# Patient Record
Sex: Female | Born: 1955
Health system: Southern US, Community
[De-identification: ages and names within clinical notes are randomized; demographics above are authoritative.]

## PROBLEM LIST (undated history)

## (undated) DIAGNOSIS — K3184 Gastroparesis: Secondary | ICD-10-CM

## (undated) DIAGNOSIS — E878 Other disorders of electrolyte and fluid balance, not elsewhere classified: Secondary | ICD-10-CM

## (undated) DIAGNOSIS — Z8249 Family history of ischemic heart disease and other diseases of the circulatory system: Secondary | ICD-10-CM

## (undated) DIAGNOSIS — M8580 Other specified disorders of bone density and structure, unspecified site: Secondary | ICD-10-CM

## (undated) DIAGNOSIS — E559 Vitamin D deficiency, unspecified: Secondary | ICD-10-CM

## (undated) DIAGNOSIS — I341 Nonrheumatic mitral (valve) prolapse: Secondary | ICD-10-CM

## (undated) DIAGNOSIS — I493 Ventricular premature depolarization: Secondary | ICD-10-CM

## (undated) DIAGNOSIS — Z923 Personal history of irradiation: Secondary | ICD-10-CM

## (undated) DIAGNOSIS — K219 Gastro-esophageal reflux disease without esophagitis: Secondary | ICD-10-CM

## (undated) DIAGNOSIS — C50919 Malignant neoplasm of unspecified site of unspecified female breast: Secondary | ICD-10-CM

## (undated) DIAGNOSIS — K5790 Diverticulosis of intestine, part unspecified, without perforation or abscess without bleeding: Secondary | ICD-10-CM

## (undated) DIAGNOSIS — K8020 Calculus of gallbladder without cholecystitis without obstruction: Secondary | ICD-10-CM

## (undated) DIAGNOSIS — E119 Type 2 diabetes mellitus without complications: Secondary | ICD-10-CM

## (undated) DIAGNOSIS — Z9289 Personal history of other medical treatment: Secondary | ICD-10-CM

## (undated) DIAGNOSIS — E785 Hyperlipidemia, unspecified: Secondary | ICD-10-CM

## (undated) DIAGNOSIS — K760 Fatty (change of) liver, not elsewhere classified: Secondary | ICD-10-CM

## (undated) DIAGNOSIS — I10 Essential (primary) hypertension: Secondary | ICD-10-CM

## (undated) HISTORY — DX: Nonrheumatic mitral (valve) prolapse: I34.1

## (undated) HISTORY — DX: Gastro-esophageal reflux disease without esophagitis: K21.9

## (undated) HISTORY — DX: Family history of ischemic heart disease and other diseases of the circulatory system: Z82.49

## (undated) HISTORY — DX: Personal history of other medical treatment: Z92.89

## (undated) HISTORY — DX: Diverticulosis of intestine, part unspecified, without perforation or abscess without bleeding: K57.90

## (undated) HISTORY — DX: Essential (primary) hypertension: I10

## (undated) HISTORY — DX: Vitamin D deficiency, unspecified: E55.9

## (undated) HISTORY — DX: Hyperlipidemia, unspecified: E78.5

## (undated) HISTORY — PX: LAPAROSCOPIC OOPHERECTOMY: SHX6507

## (undated) HISTORY — DX: Type 2 diabetes mellitus without complications: E11.9

## (undated) HISTORY — DX: Other specified disorders of bone density and structure, unspecified site: M85.80

## (undated) HISTORY — DX: Gastroparesis: K31.84

---
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
## 1997-09-23 ENCOUNTER — Ambulatory Visit (HOSPITAL_COMMUNITY): Admission: RE | Admit: 1997-09-23 | Discharge: 1997-09-23 | Payer: Self-pay | Admitting: Cardiology

## 1997-11-12 ENCOUNTER — Other Ambulatory Visit: Admission: RE | Admit: 1997-11-12 | Discharge: 1997-11-12 | Payer: Self-pay | Admitting: *Deleted

## 1999-01-22 ENCOUNTER — Other Ambulatory Visit: Admission: RE | Admit: 1999-01-22 | Discharge: 1999-01-22 | Payer: Self-pay | Admitting: *Deleted

## 2000-01-25 ENCOUNTER — Other Ambulatory Visit: Admission: RE | Admit: 2000-01-25 | Discharge: 2000-01-25 | Payer: Self-pay | Admitting: *Deleted

## 2000-06-11 ENCOUNTER — Encounter: Payer: Self-pay | Admitting: Emergency Medicine

## 2000-06-11 ENCOUNTER — Emergency Department (HOSPITAL_COMMUNITY): Admission: EM | Admit: 2000-06-11 | Discharge: 2000-06-11 | Payer: Self-pay | Admitting: Emergency Medicine

## 2001-02-27 ENCOUNTER — Other Ambulatory Visit: Admission: RE | Admit: 2001-02-27 | Discharge: 2001-02-27 | Payer: Self-pay | Admitting: *Deleted

## 2002-01-12 HISTORY — PX: APPENDECTOMY: SHX54

## 2002-02-06 ENCOUNTER — Encounter (INDEPENDENT_AMBULATORY_CARE_PROVIDER_SITE_OTHER): Payer: Self-pay

## 2002-02-06 ENCOUNTER — Observation Stay (HOSPITAL_COMMUNITY): Admission: EM | Admit: 2002-02-06 | Discharge: 2002-02-06 | Payer: Self-pay | Admitting: Emergency Medicine

## 2002-10-25 ENCOUNTER — Other Ambulatory Visit: Admission: RE | Admit: 2002-10-25 | Discharge: 2002-10-25 | Payer: Self-pay | Admitting: Obstetrics and Gynecology

## 2003-03-15 HISTORY — PX: ABDOMINAL HYSTERECTOMY: SHX81

## 2003-04-05 ENCOUNTER — Inpatient Hospital Stay (HOSPITAL_COMMUNITY): Admission: RE | Admit: 2003-04-05 | Discharge: 2003-04-07 | Payer: Self-pay | Admitting: Obstetrics and Gynecology

## 2003-04-05 ENCOUNTER — Encounter (INDEPENDENT_AMBULATORY_CARE_PROVIDER_SITE_OTHER): Payer: Self-pay

## 2004-05-12 ENCOUNTER — Other Ambulatory Visit: Admission: RE | Admit: 2004-05-12 | Discharge: 2004-05-12 | Payer: Self-pay | Admitting: Obstetrics and Gynecology

## 2004-06-09 ENCOUNTER — Ambulatory Visit: Payer: Self-pay | Admitting: Internal Medicine

## 2004-06-14 HISTORY — PX: MASTECTOMY: SHX3

## 2004-06-14 HISTORY — PX: BREAST LUMPECTOMY: SHX2

## 2004-06-26 ENCOUNTER — Ambulatory Visit: Payer: Self-pay | Admitting: Internal Medicine

## 2004-06-26 DIAGNOSIS — K5730 Diverticulosis of large intestine without perforation or abscess without bleeding: Secondary | ICD-10-CM

## 2004-06-26 HISTORY — DX: Diverticulosis of large intestine without perforation or abscess without bleeding: K57.30

## 2004-08-06 ENCOUNTER — Encounter: Admission: RE | Admit: 2004-08-06 | Discharge: 2004-08-06 | Payer: Self-pay | Admitting: *Deleted

## 2004-10-27 ENCOUNTER — Encounter: Admission: RE | Admit: 2004-10-27 | Discharge: 2004-10-27 | Payer: Self-pay | Admitting: Surgery

## 2004-11-13 ENCOUNTER — Encounter: Admission: RE | Admit: 2004-11-13 | Discharge: 2004-11-13 | Payer: Self-pay | Admitting: Surgery

## 2004-11-18 ENCOUNTER — Ambulatory Visit (HOSPITAL_COMMUNITY): Admission: RE | Admit: 2004-11-18 | Discharge: 2004-11-18 | Payer: Self-pay | Admitting: Surgery

## 2004-11-18 ENCOUNTER — Ambulatory Visit (HOSPITAL_BASED_OUTPATIENT_CLINIC_OR_DEPARTMENT_OTHER): Admission: RE | Admit: 2004-11-18 | Discharge: 2004-11-18 | Payer: Self-pay | Admitting: Surgery

## 2004-11-18 ENCOUNTER — Encounter (INDEPENDENT_AMBULATORY_CARE_PROVIDER_SITE_OTHER): Payer: Self-pay | Admitting: *Deleted

## 2004-11-30 ENCOUNTER — Ambulatory Visit (HOSPITAL_COMMUNITY): Admission: RE | Admit: 2004-11-30 | Discharge: 2004-11-30 | Payer: Self-pay | Admitting: Surgery

## 2004-11-30 ENCOUNTER — Encounter (INDEPENDENT_AMBULATORY_CARE_PROVIDER_SITE_OTHER): Payer: Self-pay | Admitting: *Deleted

## 2004-11-30 ENCOUNTER — Ambulatory Visit (HOSPITAL_BASED_OUTPATIENT_CLINIC_OR_DEPARTMENT_OTHER): Admission: RE | Admit: 2004-11-30 | Discharge: 2004-11-30 | Payer: Self-pay | Admitting: Surgery

## 2004-12-18 ENCOUNTER — Ambulatory Visit: Payer: Self-pay | Admitting: Oncology

## 2004-12-30 ENCOUNTER — Ambulatory Visit: Admission: RE | Admit: 2004-12-30 | Discharge: 2005-03-12 | Payer: Self-pay | Admitting: Radiation Oncology

## 2005-02-26 ENCOUNTER — Ambulatory Visit: Payer: Self-pay | Admitting: Oncology

## 2005-04-30 ENCOUNTER — Ambulatory Visit: Payer: Self-pay | Admitting: Oncology

## 2005-05-16 ENCOUNTER — Emergency Department (HOSPITAL_COMMUNITY): Admission: EM | Admit: 2005-05-16 | Discharge: 2005-05-16 | Payer: Self-pay | Admitting: Emergency Medicine

## 2005-09-08 ENCOUNTER — Ambulatory Visit: Admission: RE | Admit: 2005-09-08 | Discharge: 2005-09-08 | Payer: Self-pay | Admitting: Oncology

## 2005-09-08 ENCOUNTER — Ambulatory Visit: Payer: Self-pay | Admitting: Oncology

## 2005-11-01 ENCOUNTER — Ambulatory Visit: Payer: Self-pay | Admitting: Oncology

## 2005-11-01 LAB — CBC WITH DIFFERENTIAL/PLATELET
BASO%: 0.4 % (ref 0.0–2.0)
Basophils Absolute: 0 10*3/uL (ref 0.0–0.1)
EOS%: 1.8 % (ref 0.0–7.0)
Eosinophils Absolute: 0.1 10*3/uL (ref 0.0–0.5)
HCT: 37.8 % (ref 34.8–46.6)
HGB: 13.2 g/dL (ref 11.6–15.9)
LYMPH%: 25.4 % (ref 14.0–48.0)
MCH: 33.7 pg (ref 26.0–34.0)
MCHC: 34.9 g/dL (ref 32.0–36.0)
MCV: 96.4 fL (ref 81.0–101.0)
MONO#: 0.5 10*3/uL (ref 0.1–0.9)
MONO%: 8.5 % (ref 0.0–13.0)
NEUT#: 4.1 10*3/uL (ref 1.5–6.5)
NEUT%: 63.9 % (ref 39.6–76.8)
Platelets: 254 10*3/uL (ref 145–400)
RBC: 3.92 10*6/uL (ref 3.70–5.32)
RDW: 13.6 % (ref 11.3–14.5)
WBC: 6.5 10*3/uL (ref 3.9–10.0)
lymph#: 1.6 10*3/uL (ref 0.9–3.3)

## 2005-11-01 LAB — COMPREHENSIVE METABOLIC PANEL
ALT: 17 U/L (ref 0–40)
AST: 17 U/L (ref 0–37)
Albumin: 4.3 g/dL (ref 3.5–5.2)
Alkaline Phosphatase: 34 U/L — ABNORMAL LOW (ref 39–117)
BUN: 16 mg/dL (ref 6–23)
CO2: 25 mEq/L (ref 19–32)
Calcium: 9.4 mg/dL (ref 8.4–10.5)
Chloride: 108 mEq/L (ref 96–112)
Creatinine, Ser: 1 mg/dL (ref 0.4–1.2)
Glucose, Bld: 97 mg/dL (ref 70–99)
Potassium: 4 mEq/L (ref 3.5–5.3)
Sodium: 141 mEq/L (ref 135–145)
Total Bilirubin: 0.4 mg/dL (ref 0.3–1.2)
Total Protein: 6.5 g/dL (ref 6.0–8.3)

## 2005-11-01 LAB — CANCER ANTIGEN 27.29: CA 27.29: 18 U/mL (ref 0–39)

## 2006-01-12 ENCOUNTER — Ambulatory Visit: Payer: Self-pay | Admitting: Oncology

## 2006-01-13 ENCOUNTER — Encounter: Payer: Self-pay | Admitting: Vascular Surgery

## 2006-01-13 ENCOUNTER — Ambulatory Visit: Admission: RE | Admit: 2006-01-13 | Discharge: 2006-01-13 | Payer: Self-pay | Admitting: Oncology

## 2006-02-08 LAB — CBC WITH DIFFERENTIAL/PLATELET
BASO%: 0.5 % (ref 0.0–2.0)
Basophils Absolute: 0 10*3/uL (ref 0.0–0.1)
EOS%: 2 % (ref 0.0–7.0)
Eosinophils Absolute: 0.2 10*3/uL (ref 0.0–0.5)
HCT: 37.9 % (ref 34.8–46.6)
HGB: 13 g/dL (ref 11.6–15.9)
LYMPH%: 22.5 % (ref 14.0–48.0)
MCH: 33 pg (ref 26.0–34.0)
MCHC: 34.4 g/dL (ref 32.0–36.0)
MCV: 95.8 fL (ref 81.0–101.0)
MONO#: 0.6 10*3/uL (ref 0.1–0.9)
MONO%: 6.3 % (ref 0.0–13.0)
NEUT#: 6.4 10*3/uL (ref 1.5–6.5)
NEUT%: 68.7 % (ref 39.6–76.8)
Platelets: 354 10*3/uL (ref 145–400)
RBC: 3.96 10*6/uL (ref 3.70–5.32)
RDW: 13.5 % (ref 11.3–14.5)
WBC: 9.3 10*3/uL (ref 3.9–10.0)
lymph#: 2.1 10*3/uL (ref 0.9–3.3)

## 2006-02-16 LAB — COMPREHENSIVE METABOLIC PANEL
ALT: 18 U/L (ref 0–40)
AST: 17 U/L (ref 0–37)
Albumin: 4.2 g/dL (ref 3.5–5.2)
Alkaline Phosphatase: 43 U/L (ref 39–117)
BUN: 17 mg/dL (ref 6–23)
CO2: 28 mEq/L (ref 19–32)
Calcium: 9.2 mg/dL (ref 8.4–10.5)
Chloride: 105 mEq/L (ref 96–112)
Creatinine, Ser: 1.05 mg/dL (ref 0.40–1.20)
Glucose, Bld: 97 mg/dL (ref 70–99)
Potassium: 3.7 mEq/L (ref 3.5–5.3)
Sodium: 143 mEq/L (ref 135–145)
Total Bilirubin: 0.3 mg/dL (ref 0.3–1.2)
Total Protein: 6.8 g/dL (ref 6.0–8.3)

## 2006-02-16 LAB — ESTRADIOL, ULTRA SENS: Estradiol, Ultra Sensitive: 245 pg/mL

## 2006-02-16 LAB — FOLLICLE STIMULATING HORMONE: FSH: 36.1 m[IU]/mL

## 2006-02-16 LAB — CANCER ANTIGEN 27.29: CA 27.29: 16 U/mL (ref 0–39)

## 2006-03-31 ENCOUNTER — Ambulatory Visit: Payer: Self-pay | Admitting: Oncology

## 2006-06-15 ENCOUNTER — Ambulatory Visit: Payer: Self-pay | Admitting: Oncology

## 2006-06-20 LAB — CBC WITH DIFFERENTIAL/PLATELET
BASO%: 0.5 % (ref 0.0–2.0)
Basophils Absolute: 0 10*3/uL (ref 0.0–0.1)
EOS%: 1.6 % (ref 0.0–7.0)
Eosinophils Absolute: 0.1 10*3/uL (ref 0.0–0.5)
HCT: 39.1 % (ref 34.8–46.6)
HGB: 13.3 g/dL (ref 11.6–15.9)
LYMPH%: 25 % (ref 14.0–48.0)
MCH: 32.8 pg (ref 26.0–34.0)
MCHC: 34.1 g/dL (ref 32.0–36.0)
MCV: 96.1 fL (ref 81.0–101.0)
MONO#: 0.6 10*3/uL (ref 0.1–0.9)
MONO%: 8.1 % (ref 0.0–13.0)
NEUT#: 4.8 10*3/uL (ref 1.5–6.5)
NEUT%: 64.8 % (ref 39.6–76.8)
Platelets: 321 10*3/uL (ref 145–400)
RBC: 4.07 10*6/uL (ref 3.70–5.32)
RDW: 13.4 % (ref 11.3–14.5)
WBC: 7.3 10*3/uL (ref 3.9–10.0)
lymph#: 1.8 10*3/uL (ref 0.9–3.3)

## 2006-06-20 LAB — COMPREHENSIVE METABOLIC PANEL
ALT: 19 U/L (ref 0–35)
AST: 18 U/L (ref 0–37)
Albumin: 4.5 g/dL (ref 3.5–5.2)
Alkaline Phosphatase: 43 U/L (ref 39–117)
BUN: 23 mg/dL (ref 6–23)
CO2: 26 mEq/L (ref 19–32)
Calcium: 9.8 mg/dL (ref 8.4–10.5)
Chloride: 105 mEq/L (ref 96–112)
Creatinine, Ser: 1.05 mg/dL (ref 0.40–1.20)
Glucose, Bld: 96 mg/dL (ref 70–99)
Potassium: 4.3 mEq/L (ref 3.5–5.3)
Sodium: 139 mEq/L (ref 135–145)
Total Bilirubin: 0.4 mg/dL (ref 0.3–1.2)
Total Protein: 6.8 g/dL (ref 6.0–8.3)

## 2006-06-20 LAB — CANCER ANTIGEN 27.29: CA 27.29: 18 U/mL (ref 0–39)

## 2006-12-15 ENCOUNTER — Ambulatory Visit: Payer: Self-pay | Admitting: Oncology

## 2006-12-20 LAB — COMPREHENSIVE METABOLIC PANEL
ALT: 41 U/L — ABNORMAL HIGH (ref 0–35)
AST: 35 U/L (ref 0–37)
Albumin: 4.2 g/dL (ref 3.5–5.2)
Alkaline Phosphatase: 49 U/L (ref 39–117)
BUN: 13 mg/dL (ref 6–23)
CO2: 25 mEq/L (ref 19–32)
Calcium: 9.7 mg/dL (ref 8.4–10.5)
Chloride: 106 mEq/L (ref 96–112)
Creatinine, Ser: 1.03 mg/dL (ref 0.40–1.20)
Glucose, Bld: 122 mg/dL — ABNORMAL HIGH (ref 70–99)
Potassium: 3.9 mEq/L (ref 3.5–5.3)
Sodium: 143 mEq/L (ref 135–145)
Total Bilirubin: 0.3 mg/dL (ref 0.3–1.2)
Total Protein: 6.7 g/dL (ref 6.0–8.3)

## 2006-12-20 LAB — CBC WITH DIFFERENTIAL/PLATELET
BASO%: 0.2 % (ref 0.0–2.0)
Basophils Absolute: 0 10*3/uL (ref 0.0–0.1)
EOS%: 3 % (ref 0.0–7.0)
Eosinophils Absolute: 0.2 10*3/uL (ref 0.0–0.5)
HCT: 38 % (ref 34.8–46.6)
HGB: 13.3 g/dL (ref 11.6–15.9)
LYMPH%: 24.5 % (ref 14.0–48.0)
MCH: 33.2 pg (ref 26.0–34.0)
MCHC: 35.1 g/dL (ref 32.0–36.0)
MCV: 94.8 fL (ref 81.0–101.0)
MONO#: 0.6 10*3/uL (ref 0.1–0.9)
MONO%: 7.8 % (ref 0.0–13.0)
NEUT#: 4.6 10*3/uL (ref 1.5–6.5)
NEUT%: 64.5 % (ref 39.6–76.8)
Platelets: 317 10*3/uL (ref 145–400)
RBC: 4.01 10*6/uL (ref 3.70–5.32)
RDW: 13.3 % (ref 11.3–14.5)
WBC: 7.1 10*3/uL (ref 3.9–10.0)
lymph#: 1.7 10*3/uL (ref 0.9–3.3)

## 2006-12-20 LAB — CANCER ANTIGEN 27.29: CA 27.29: 17 U/mL (ref 0–39)

## 2006-12-20 LAB — FOLLICLE STIMULATING HORMONE: FSH: 33.5 m[IU]/mL

## 2006-12-23 ENCOUNTER — Ambulatory Visit (HOSPITAL_COMMUNITY): Admission: RE | Admit: 2006-12-23 | Discharge: 2006-12-23 | Payer: Self-pay | Admitting: Internal Medicine

## 2006-12-23 ENCOUNTER — Ambulatory Visit: Payer: Self-pay | Admitting: Gastroenterology

## 2006-12-23 LAB — CONVERTED CEMR LAB
Amylase: 65 units/L (ref 27–131)
Basophils Absolute: 0.1 10*3/uL (ref 0.0–0.1)
Basophils Relative: 0.7 % (ref 0.0–1.0)
Eosinophils Absolute: 0.1 10*3/uL (ref 0.0–0.6)
Eosinophils Relative: 1.4 % (ref 0.0–5.0)
HCT: 37.9 % (ref 36.0–46.0)
Hemoglobin: 13.3 g/dL (ref 12.0–15.0)
Lipase: 24 units/L (ref 11.0–59.0)
Lymphocytes Relative: 23.8 % (ref 12.0–46.0)
MCHC: 35 g/dL (ref 30.0–36.0)
MCV: 94.7 fL (ref 78.0–100.0)
Monocytes Absolute: 0.7 10*3/uL (ref 0.2–0.7)
Monocytes Relative: 8.1 % (ref 3.0–11.0)
Neutro Abs: 5.9 10*3/uL (ref 1.4–7.7)
Neutrophils Relative %: 66 % (ref 43.0–77.0)
Platelets: 356 10*3/uL (ref 150–400)
RBC: 4 M/uL (ref 3.87–5.11)
RDW: 12.5 % (ref 11.5–14.6)
WBC: 8.9 10*3/uL (ref 4.5–10.5)

## 2006-12-30 LAB — ESTRADIOL, ULTRA SENS: Estradiol, Ultra Sensitive: <2 pg/mL

## 2007-01-03 ENCOUNTER — Ambulatory Visit: Payer: Self-pay | Admitting: Internal Medicine

## 2007-01-05 ENCOUNTER — Ambulatory Visit (HOSPITAL_COMMUNITY): Admission: RE | Admit: 2007-01-05 | Discharge: 2007-01-05 | Payer: Self-pay | Admitting: Obstetrics and Gynecology

## 2007-01-05 ENCOUNTER — Encounter (INDEPENDENT_AMBULATORY_CARE_PROVIDER_SITE_OTHER): Payer: Self-pay | Admitting: Obstetrics and Gynecology

## 2007-03-22 ENCOUNTER — Ambulatory Visit: Payer: Self-pay | Admitting: Gastroenterology

## 2007-03-28 ENCOUNTER — Ambulatory Visit (HOSPITAL_COMMUNITY): Admission: RE | Admit: 2007-03-28 | Discharge: 2007-03-28 | Payer: Self-pay | Admitting: Gastroenterology

## 2007-03-28 DIAGNOSIS — K3184 Gastroparesis: Secondary | ICD-10-CM | POA: Insufficient documentation

## 2007-03-30 ENCOUNTER — Ambulatory Visit: Payer: Self-pay | Admitting: Internal Medicine

## 2007-03-30 DIAGNOSIS — D131 Benign neoplasm of stomach: Secondary | ICD-10-CM | POA: Insufficient documentation

## 2007-03-30 HISTORY — DX: Benign neoplasm of stomach: D13.1

## 2007-04-18 ENCOUNTER — Ambulatory Visit: Payer: Self-pay | Admitting: Internal Medicine

## 2007-04-18 LAB — CONVERTED CEMR LAB: TSH: 0.67 microintl units/mL (ref 0.35–5.50)

## 2007-08-04 DIAGNOSIS — E785 Hyperlipidemia, unspecified: Secondary | ICD-10-CM | POA: Insufficient documentation

## 2007-08-04 DIAGNOSIS — N83209 Unspecified ovarian cyst, unspecified side: Secondary | ICD-10-CM

## 2007-08-04 DIAGNOSIS — R143 Flatulence: Secondary | ICD-10-CM

## 2007-08-04 DIAGNOSIS — R142 Eructation: Secondary | ICD-10-CM

## 2007-08-04 DIAGNOSIS — K589 Irritable bowel syndrome without diarrhea: Secondary | ICD-10-CM | POA: Insufficient documentation

## 2007-08-04 DIAGNOSIS — R141 Gas pain: Secondary | ICD-10-CM | POA: Insufficient documentation

## 2007-08-04 DIAGNOSIS — C50919 Malignant neoplasm of unspecified site of unspecified female breast: Secondary | ICD-10-CM | POA: Insufficient documentation

## 2007-08-04 HISTORY — DX: Irritable bowel syndrome, unspecified: K58.9

## 2007-08-04 HISTORY — DX: Unspecified ovarian cyst, unspecified side: N83.209

## 2007-10-18 ENCOUNTER — Encounter: Payer: Self-pay | Admitting: Internal Medicine

## 2007-10-25 ENCOUNTER — Ambulatory Visit: Payer: Self-pay | Admitting: Internal Medicine

## 2007-12-18 ENCOUNTER — Ambulatory Visit: Payer: Self-pay | Admitting: Oncology

## 2007-12-21 LAB — CBC WITH DIFFERENTIAL/PLATELET
BASO%: 0.5 % (ref 0.0–2.0)
Basophils Absolute: 0 10*3/uL (ref 0.0–0.1)
EOS%: 1.8 % (ref 0.0–7.0)
Eosinophils Absolute: 0.1 10*3/uL (ref 0.0–0.5)
HCT: 37.2 % (ref 34.8–46.6)
HGB: 12.9 g/dL (ref 11.6–15.9)
LYMPH%: 26.3 % (ref 14.0–48.0)
MCH: 32.4 pg (ref 26.0–34.0)
MCHC: 34.5 g/dL (ref 32.0–36.0)
MCV: 93.9 fL (ref 81.0–101.0)
MONO#: 0.5 10*3/uL (ref 0.1–0.9)
MONO%: 7.2 % (ref 0.0–13.0)
NEUT#: 4.4 10*3/uL (ref 1.5–6.5)
NEUT%: 64.2 % (ref 39.6–76.8)
Platelets: 277 10*3/uL (ref 145–400)
RBC: 3.96 10*6/uL (ref 3.70–5.32)
RDW: 13.4 % (ref 11.3–14.5)
WBC: 6.9 10*3/uL (ref 3.9–10.0)
lymph#: 1.8 10*3/uL (ref 0.9–3.3)

## 2007-12-22 LAB — COMPREHENSIVE METABOLIC PANEL
ALT: 30 U/L (ref 0–35)
AST: 27 U/L (ref 0–37)
Albumin: 4.4 g/dL (ref 3.5–5.2)
Alkaline Phosphatase: 52 U/L (ref 39–117)
BUN: 15 mg/dL (ref 6–23)
CO2: 25 mEq/L (ref 19–32)
Calcium: 9.4 mg/dL (ref 8.4–10.5)
Chloride: 106 mEq/L (ref 96–112)
Creatinine, Ser: 1.13 mg/dL (ref 0.40–1.20)
Glucose, Bld: 99 mg/dL (ref 70–99)
Potassium: 3.5 mEq/L (ref 3.5–5.3)
Sodium: 141 mEq/L (ref 135–145)
Total Bilirubin: 0.2 mg/dL — ABNORMAL LOW (ref 0.3–1.2)
Total Protein: 6.8 g/dL (ref 6.0–8.3)

## 2007-12-22 LAB — CANCER ANTIGEN 27.29: CA 27.29: 24 U/mL (ref 0–39)

## 2007-12-22 LAB — VITAMIN D 25 HYDROXY (VIT D DEFICIENCY, FRACTURES): Vit D, 25-Hydroxy: 19 ng/mL — ABNORMAL LOW (ref 30–89)

## 2007-12-28 ENCOUNTER — Encounter: Payer: Self-pay | Admitting: Internal Medicine

## 2008-12-20 ENCOUNTER — Ambulatory Visit: Payer: Self-pay | Admitting: Oncology

## 2008-12-24 LAB — CBC WITH DIFFERENTIAL/PLATELET
BASO%: 0.5 % (ref 0.0–2.0)
Basophils Absolute: 0 10*3/uL (ref 0.0–0.1)
EOS%: 2.3 % (ref 0.0–7.0)
Eosinophils Absolute: 0.2 10*3/uL (ref 0.0–0.5)
HCT: 38.1 % (ref 34.8–46.6)
HGB: 13.2 g/dL (ref 11.6–15.9)
LYMPH%: 27.5 % (ref 14.0–49.7)
MCH: 33.1 pg (ref 25.1–34.0)
MCHC: 34.7 g/dL (ref 31.5–36.0)
MCV: 95.3 fL (ref 79.5–101.0)
MONO#: 0.5 10*3/uL (ref 0.1–0.9)
MONO%: 6 % (ref 0.0–14.0)
NEUT#: 4.9 10*3/uL (ref 1.5–6.5)
NEUT%: 63.7 % (ref 38.4–76.8)
Platelets: 273 10*3/uL (ref 145–400)
RBC: 4 10*6/uL (ref 3.70–5.45)
RDW: 13.1 % (ref 11.2–14.5)
WBC: 7.7 10*3/uL (ref 3.9–10.3)
lymph#: 2.1 10*3/uL (ref 0.9–3.3)

## 2008-12-24 LAB — COMPREHENSIVE METABOLIC PANEL
ALT: 35 U/L (ref 0–35)
AST: 29 U/L (ref 0–37)
Albumin: 4.3 g/dL (ref 3.5–5.2)
Alkaline Phosphatase: 57 U/L (ref 39–117)
BUN: 17 mg/dL (ref 6–23)
CO2: 23 mEq/L (ref 19–32)
Calcium: 9.6 mg/dL (ref 8.4–10.5)
Chloride: 105 mEq/L (ref 96–112)
Creatinine, Ser: 1.15 mg/dL (ref 0.40–1.20)
Glucose, Bld: 125 mg/dL — ABNORMAL HIGH (ref 70–99)
Potassium: 4.2 mEq/L (ref 3.5–5.3)
Sodium: 141 mEq/L (ref 135–145)
Total Bilirubin: 0.2 mg/dL — ABNORMAL LOW (ref 0.3–1.2)
Total Protein: 6.4 g/dL (ref 6.0–8.3)

## 2009-10-12 ENCOUNTER — Encounter (INDEPENDENT_AMBULATORY_CARE_PROVIDER_SITE_OTHER): Payer: Self-pay | Admitting: *Deleted

## 2009-10-12 ENCOUNTER — Observation Stay (HOSPITAL_COMMUNITY): Admission: EM | Admit: 2009-10-12 | Discharge: 2009-10-13 | Payer: Self-pay | Admitting: Emergency Medicine

## 2009-10-13 HISTORY — PX: CARDIAC CATHETERIZATION: SHX172

## 2009-10-14 ENCOUNTER — Encounter (INDEPENDENT_AMBULATORY_CARE_PROVIDER_SITE_OTHER): Payer: Self-pay | Admitting: *Deleted

## 2009-10-22 ENCOUNTER — Telehealth: Payer: Self-pay | Admitting: Internal Medicine

## 2009-11-06 ENCOUNTER — Telehealth: Payer: Self-pay | Admitting: Internal Medicine

## 2009-11-07 ENCOUNTER — Ambulatory Visit: Payer: Self-pay | Admitting: Gastroenterology

## 2009-11-07 DIAGNOSIS — R6881 Early satiety: Secondary | ICD-10-CM | POA: Insufficient documentation

## 2009-11-07 DIAGNOSIS — Z853 Personal history of malignant neoplasm of breast: Secondary | ICD-10-CM | POA: Insufficient documentation

## 2009-11-07 DIAGNOSIS — F411 Generalized anxiety disorder: Secondary | ICD-10-CM

## 2009-11-07 DIAGNOSIS — E119 Type 2 diabetes mellitus without complications: Secondary | ICD-10-CM

## 2009-11-07 DIAGNOSIS — R634 Abnormal weight loss: Secondary | ICD-10-CM | POA: Insufficient documentation

## 2009-11-07 DIAGNOSIS — E0590 Thyrotoxicosis, unspecified without thyrotoxic crisis or storm: Secondary | ICD-10-CM

## 2009-11-07 DIAGNOSIS — R1013 Epigastric pain: Secondary | ICD-10-CM | POA: Insufficient documentation

## 2009-11-07 DIAGNOSIS — K219 Gastro-esophageal reflux disease without esophagitis: Secondary | ICD-10-CM

## 2009-11-07 DIAGNOSIS — R12 Heartburn: Secondary | ICD-10-CM | POA: Insufficient documentation

## 2009-11-07 HISTORY — DX: Gastro-esophageal reflux disease without esophagitis: K21.9

## 2009-11-07 HISTORY — DX: Thyrotoxicosis, unspecified without thyrotoxic crisis or storm: E05.90

## 2009-11-07 HISTORY — DX: Generalized anxiety disorder: F41.1

## 2009-11-07 HISTORY — DX: Type 2 diabetes mellitus without complications: E11.9

## 2009-11-11 ENCOUNTER — Encounter (INDEPENDENT_AMBULATORY_CARE_PROVIDER_SITE_OTHER): Payer: Self-pay | Admitting: *Deleted

## 2009-11-12 ENCOUNTER — Ambulatory Visit (HOSPITAL_COMMUNITY): Admission: RE | Admit: 2009-11-12 | Discharge: 2009-11-12 | Payer: Self-pay | Admitting: Internal Medicine

## 2009-11-12 ENCOUNTER — Ambulatory Visit: Payer: Self-pay | Admitting: Internal Medicine

## 2009-11-12 LAB — CONVERTED CEMR LAB
ALT: 37 units/L — ABNORMAL HIGH (ref 0–35)
AST: 32 units/L (ref 0–37)
Albumin: 4.4 g/dL (ref 3.5–5.2)
Alkaline Phosphatase: 56 units/L (ref 39–117)
BUN: 17 mg/dL (ref 6–23)
Basophils Absolute: 0 10*3/uL (ref 0.0–0.1)
Basophils Relative: 0.4 % (ref 0.0–3.0)
CO2: 30 meq/L (ref 19–32)
Calcium: 9.7 mg/dL (ref 8.4–10.5)
Chloride: 105 meq/L (ref 96–112)
Creatinine, Ser: 1 mg/dL (ref 0.4–1.2)
Eosinophils Absolute: 0.2 10*3/uL (ref 0.0–0.7)
Eosinophils Relative: 2.2 % (ref 0.0–5.0)
GFR calc non Af Amer: 59.6 mL/min (ref >60)
Glucose, Bld: 83 mg/dL (ref 70–99)
HCT: 38.6 % (ref 36.0–46.0)
Hemoglobin: 13.4 g/dL (ref 12.0–15.0)
Lipase: 10 units/L — ABNORMAL LOW (ref 11.0–59.0)
Lymphocytes Relative: 27.1 % (ref 12.0–46.0)
Lymphs Abs: 2.4 10*3/uL (ref 0.7–4.0)
MCHC: 34.7 g/dL (ref 30.0–36.0)
MCV: 95.4 fL (ref 78.0–100.0)
Monocytes Absolute: 0.7 10*3/uL (ref 0.1–1.0)
Monocytes Relative: 7.7 % (ref 3.0–12.0)
Neutro Abs: 5.6 10*3/uL (ref 1.4–7.7)
Neutrophils Relative %: 62.6 % (ref 43.0–77.0)
Platelets: 310 10*3/uL (ref 150.0–400.0)
Potassium: 4 meq/L (ref 3.5–5.1)
RBC: 4.04 M/uL (ref 3.87–5.11)
RDW: 13 % (ref 11.5–14.6)
Sodium: 143 meq/L (ref 135–145)
Total Bilirubin: 0.3 mg/dL (ref 0.3–1.2)
Total Protein: 7 g/dL (ref 6.0–8.3)
WBC: 9 10*3/uL (ref 4.5–10.5)

## 2009-11-13 ENCOUNTER — Ambulatory Visit: Payer: Self-pay | Admitting: Internal Medicine

## 2009-12-08 ENCOUNTER — Ambulatory Visit: Payer: Self-pay | Admitting: Internal Medicine

## 2009-12-08 LAB — CONVERTED CEMR LAB: IgA: 159 mg/dL (ref 68–378)

## 2009-12-09 ENCOUNTER — Encounter: Payer: Self-pay | Admitting: Internal Medicine

## 2009-12-10 LAB — CONVERTED CEMR LAB: Tissue Transglutaminase Ab, IgA: 4.5 units (ref <20)

## 2009-12-12 ENCOUNTER — Ambulatory Visit: Payer: Self-pay | Admitting: Oncology

## 2009-12-17 LAB — CBC WITH DIFFERENTIAL/PLATELET
BASO%: 0.8 % (ref 0.0–2.0)
Basophils Absolute: 0.1 10*3/uL (ref 0.0–0.1)
EOS%: 2.8 % (ref 0.0–7.0)
Eosinophils Absolute: 0.2 10*3/uL (ref 0.0–0.5)
HCT: 36.2 % (ref 34.8–46.6)
HGB: 12.7 g/dL (ref 11.6–15.9)
LYMPH%: 28.3 % (ref 14.0–49.7)
MCH: 33.5 pg (ref 25.1–34.0)
MCHC: 35.2 g/dL (ref 31.5–36.0)
MCV: 95.1 fL (ref 79.5–101.0)
MONO#: 0.6 10*3/uL (ref 0.1–0.9)
MONO%: 7.9 % (ref 0.0–14.0)
NEUT#: 4.2 10*3/uL (ref 1.5–6.5)
NEUT%: 60.2 % (ref 38.4–76.8)
Platelets: 285 10*3/uL (ref 145–400)
RBC: 3.81 10*6/uL (ref 3.70–5.45)
RDW: 13.5 % (ref 11.2–14.5)
WBC: 7 10*3/uL (ref 3.9–10.3)
lymph#: 2 10*3/uL (ref 0.9–3.3)

## 2009-12-17 LAB — COMPREHENSIVE METABOLIC PANEL
ALT: 24 U/L (ref 0–35)
AST: 25 U/L (ref 0–37)
Albumin: 4.2 g/dL (ref 3.5–5.2)
Alkaline Phosphatase: 58 U/L (ref 39–117)
BUN: 18 mg/dL (ref 6–23)
CO2: 22 mEq/L (ref 19–32)
Calcium: 9.6 mg/dL (ref 8.4–10.5)
Chloride: 107 mEq/L (ref 96–112)
Creatinine, Ser: 1.08 mg/dL (ref 0.40–1.20)
Glucose, Bld: 121 mg/dL — ABNORMAL HIGH (ref 70–99)
Potassium: 3.8 mEq/L (ref 3.5–5.3)
Sodium: 141 mEq/L (ref 135–145)
Total Bilirubin: 0.2 mg/dL — ABNORMAL LOW (ref 0.3–1.2)
Total Protein: 6.5 g/dL (ref 6.0–8.3)

## 2010-01-02 ENCOUNTER — Telehealth: Payer: Self-pay | Admitting: Internal Medicine

## 2010-01-06 ENCOUNTER — Ambulatory Visit: Payer: Self-pay | Admitting: Surgery

## 2010-01-06 ENCOUNTER — Encounter: Payer: Self-pay | Admitting: Oncology

## 2010-01-06 ENCOUNTER — Ambulatory Visit: Admission: RE | Admit: 2010-01-06 | Discharge: 2010-01-06 | Payer: Self-pay | Admitting: Oncology

## 2010-07-16 NOTE — Miscellaneous (Signed)
Summary: Meds  Clinical Lists Changes  Medications: Added new medication of ASPIRIN 81 MG  TBEC (ASPIRIN) Take 1 tablet by mouth once a day Added new medication of ZETIA 10 MG  TABS (EZETIMIBE) Take 1 tablet by mouth once a day Added new medication of PAXIL CR 12.5 MG  TB24 (PAROXETINE HCL) Take 1 tablet by mouth once a day Added new medication of ALAVERT 10 MG  TABS (LORATADINE) Take 1 tablet by mouth once a day Added new medication of TAMOXIFEN CITRATE 20 MG  TABS (TAMOXIFEN CITRATE) Take 1 tablet by mouth once a day Added new medication of NEXIUM 40 MG  CPDR (ESOMEPRAZOLE MAGNESIUM) Take 1 tablet by two times a day Added new medication of ATENOLOL 25 MG  TABS (ATENOLOL) Take 1 tablet by mouth once a day Added new medication of MULTIVITAMINS   TABS (MULTIPLE VITAMIN) Take 1 tablet by mouth once a day Added new medication of VIACTIV 500-100-40  CHEW (CALCIUM-VITAMIN D-VITAMIN K) Take 1 tablet by mouth two times a day Added new medication of * VITAMIN C Take 1 tablet two times a day Added new medication of NASONEX 50 MCG/ACT  SUSP (MOMETASONE FUROATE) take as needed Added new medication of * GAS-X take as needed Added new medication of DRAMAMINE 50 MG  TABS (DIMENHYDRINATE) take as needed

## 2010-07-16 NOTE — Progress Notes (Signed)
Summary: M.D. Switch Approved  Phone Note From Other Clinic   Caller: kay--Dr. [name] [phone] Call For: Dr. Juanda Chance Summary of Call: pt. would like to switch from Dr. Juanda Chance to Dr. Marina Goodell b/c her husband is a pt. of Dr. Lamar Sprinkles and she would like a 2nd opinion Initial call taken by: Karna Christmas,  Oct 22, 2009 2:42 PM  Follow-up for Phone Call        DR.Azaela Caracci--Do you approve switch? Laureen Ochs LPN  Oct 22, 2009 2:57 PM   Additional Follow-up for Phone Call Additional follow up Details #1::        yes. DB Additional Follow-up by: Hart Carwin MD,  Oct 22, 2009 7:19 PM    Additional Follow-up for Phone Call Additional follow up Details #2::    DR.PERRY--Will you accept this patient?  Laureen Ochs LPN  Oct 23, 2009 8:56 AM   Additional Follow-up for Phone Call Additional follow up Details #3:: Details for Additional Follow-up Action Taken: yes, but no overbook to my schedule. routine follow up only Additional Follow-up by: Hilarie Fredrickson MD,  Oct 23, 2009 9:08 AM  Pt. is scheduled to see Dr.Perry on 12-08-09 at 3:45pm. I have left a message for Joyce Gross to advise pt. of appt/med.list/co-pay/cx.policy. Laureen Ochs LPN  Oct 23, 2009 9:26 AM

## 2010-07-16 NOTE — Letter (Signed)
Summary: [facility]  [facility]   Imported By: Esmeralda Links D'jimraou 03/15/2008 13:13:42  _____________________________________________________________________  External Attachment:    Type:   Image     Comment:   External Document

## 2010-07-16 NOTE — Assessment & Plan Note (Signed)
Summary: Gas and bloating   History of Present Illness Visit Type: Initial Consult Primary GI MD: Yancey Flemings MD Primary Provider: Sigmund Hazel Requesting Provider: Jennette Kettle Chief Complaint: To establish with Dr. Marina Goodell ( switched from Dr. Juanda Chance) History of Present Illness:   55 year old female with hyperlipidemia, breast cancer, hypertension, GERD, and irritable bowel syndrome. She has been followed by Dr. Juanda Chance regarding GI care. She has switched to me because I have taken care of her husband. I have reviewed the old record. She is had problems with abdominal bloating with discomfort and nausea since the summer of 2008. This after an apparent infectious gastroenteritis. At one point she was noted to have mild gastroparesis. Upper endoscopy in October 2008 was normal except for benign fundic gland polyps. Complete colonoscopy in January 2006 revealed diverticulosis. She was evaluated by the physician assistant Nov 07, 2009 for GERD and weight loss. Also complains of severe chest pain that brought her to the hospital. Laboratories including CBC, comprehensive metabolic panel, and lipase were unremarkable. Abdominal ultrasound performed June 1 was also unremarkable except for changes of fatty liver. She was placed on Dexilant. Given sucralfate. Asked to try low-dose Reglan. She was scheduled for upper endoscopy which was performed June 2. This was essentially normal except for incidental small fundic gland-type polyps as previously noted. She schedules herself today for a followup appointment. She is quite vague about her complaints today. She tells me that her reflux problems or better with Dexilant. She does complain of ongoing chronic bloating. She has gained 45 pounds since her last office visit. It seems that she wants to have some deep understanding about the nature of her bloating.   GI Review of Systems    Reports abdominal pain, acid reflux, bloating, heartburn, loss of appetite, and   nausea.     Location of  Abdominal pain: epigastric area.    Denies belching, chest pain, dysphagia with liquids, dysphagia with solids, vomiting, vomiting blood, weight loss, and  weight gain.      Reports diarrhea and  light color stool.     Denies anal fissure, black tarry stools, change in bowel habit, constipation, diverticulosis, fecal incontinence, heme positive stool, hemorrhoids, irritable bowel syndrome, jaundice, liver problems, rectal bleeding, and  rectal pain.    Current Medications (verified): 1)  Aspirin 81 Mg  Tbec (Aspirin) .... Take 1 Tablet By Mouth Once A Day 2)  Zetia 10 Mg  Tabs (Ezetimibe) .... Take 1 Tablet By Mouth Once A Day 3)  Paxil Cr 12.5 Mg  Tb24 (Paroxetine Hcl) .... Take 1 Tablet By Mouth Once A Day 4)  Tamoxifen Citrate 20 Mg  Tabs (Tamoxifen Citrate) .... Take 1 Tablet By Mouth Once A Day 5)  Atenolol 25 Mg  Tabs (Atenolol) .... Take 1 Tablet By Mouth Once A Day 6)  Multivitamins   Tabs (Multiple Vitamin) .... Take 1 Tablet By Mouth Once A Day 7)  Viactiv 500-100-40  Chew (Calcium-Vitamin D-Vitamin K) .... Take 1 Tablet By Mouth Two Times A Day 8)  Vitamin C .... Take 1 Tablet Two Times A Day 9)  Nasonex 50 Mcg/act  Susp (Mometasone Furoate) .... Take As Needed 10)  Fish Oil   Oil (Fish Oil) .... One Tablet By Mouth Two Times A Day 11)  Amlodipine Besylate 5 Mg Tabs (Amlodipine Besylate) .... One Tablet By Mouth Once Daily 12)  Black Cohosh 40 Mg Caps (Black Cohosh) .... One Tablet By Mouth Two Times A Day 13)  Ambien  10 Mg Tabs (Zolpidem Tartrate) .... As Needed 14)  Nitrostat 0.4 Mg Subl (Nitroglycerin) .... As Needed 15)  Promethazine Hcl 12.5 Mg Tabs (Promethazine Hcl) .... Take 1/2 To 1 Tab Every 6 Hours As Needed For Nausea 16)  Reglan 5 Mg Tabs (Metoclopramide Hcl) .... Take 1 Tab  30 Min Before Each Meal 17)  Dexilant 60 Mg Cpdr (Dexlansoprazole) .... Take 1 Cap 30 Min Before Breakfast  Allergies (verified): 1)  ! Amoxicillin 2)  !  Erythromycin  Past History:  Past Medical History: Reviewed history from 11/07/2009 and no changes required. Current Problems:  GASTROPARESIS (ICD-536.3) GERD OVARIAN CYST (ICD-620.2) IRRITABLE BOWEL SYNDROME (ICD-564.1) HYPERLIPIDEMIA (ICD-272.4) ADENOCARCINOMA, BREAST (ICD-174.9) GASTRIC POLYP (ICD-211.1) DIVERTICULOSIS, COLON (ICD-562.10) HTN  Past Surgical History: Reviewed history from 08/04/2007 and no changes required. Tonsillectomy Partial Hysterectomy Breast lumpectomy Appendectomy Rt ovarian cystectomy  Family History: Reviewed history from 10/25/2007 and no changes required. Family History of Breast Cancer: paternal grandmother, paternal aunt No FH of Colon Cancer Family History of Prostate Cancer: father Family History of Diabetes: grandmother, brother Family History of Heart Disease:  father, brother  Social History: Reviewed history from 10/25/2007 and no changes required. Patient has never smoked.  Alcohol Use - yes 2 glasses weekly Daily Caffeine Use 2 cups daily Illicit Drug Use - no Patient gets regular exercise.  Review of Systems       The patient complains of allergy/sinus, arthritis/joint pain, fatigue, heart murmur, heart rhythm changes, night sweats, and sleeping problems.  The patient denies anemia, anxiety-new, back pain, blood in urine, breast changes/lumps, change in vision, confusion, cough, coughing up blood, depression-new, fainting, fever, headaches-new, hearing problems, itching, menstrual pain, muscle pains/cramps, nosebleeds, pregnancy symptoms, shortness of breath, skin rash, sore throat, swelling of feet/legs, swollen lymph glands, thirst - excessive , urination - excessive , urination changes/pain, urine leakage, vision changes, and voice change.    Vital Signs:  Patient profile:   55 year old female Height:      62 inches Weight:      158.25 pounds BMI:     29.05 Pulse rate:   60 / minute Pulse rhythm:   regular BP sitting:    126 / 80  (left arm) Cuff size:   regular  Vitals Entered By: June McMurray CMA Duncan Dull) (December 08, 2009 1:50 PM)  Physical Exam  General:  Well developed, well nourished, no acute distress. Head:  Normocephalic and atraumatic. Eyes:  PERRLA, no icterus. Ears:  Normal auditory acuity. Nose:  No deformity, discharge,  or lesions. Mouth:  No deformity or lesions, dentition normal. Neck:  Supple; no masses or thyromegaly. Lungs:  Clear throughout to auscultation. Heart:  Regular rate and rhythm; no murmurs, rubs,  or bruits. Abdomen:  Soft, nontender and nondistended. No masses, hepatosplenomegaly or hernias noted. Normal bowel sounds. Msk:  Symmetrical with no gross deformities. Normal posture. Pulses:  Normal pulses noted. Extremities:  No clubbing, cyanosis, edema or deformities noted. Neurologic:  Alert and  oriented x4;   Skin:  Intact without significant lesions or rashes. Psych:  Alert and cooperative. Normal mood and affect.   Impression & Recommendations:  Problem # 1:  ABDOMINAL BLOATING (ICD-787.3) Chronic bloating x3 years. May have postinfectious irritable bowel type picture. No worrisome features. An element of gastroparesis previously identified.  Plan: #1. Check tissue transglutaminase antibody and serum IgA as a screen for celiac sprue #2. Empiric trial of probiotic align one daily x2 weeks. Samples given. #3. Literature provided on intestinal gas. As  well, anti-gas and flatulence dietary sheet. #4. Reassurance. #5. Followup p.r.n.  Problem # 2:  GERD (ICD-530.81) no classic GERD symptoms on Dexilant. Not clear that her previous atypical symptoms and chest pain were at all related to GERD.  Plan: #1. Continue Dexilant. Prescription submitted electronically #2. Discontinue Reglan #3. Reflux precautions  Problem # 3:  SCREENING COLORECTAL-CANCER (ICD-V76.51) initial screening exam in 2006 negative except for diverticulosis. Would be due for routine followup  around January 2016  Other Orders: T-Tissue Transglutamase Ab IgA [phone]) TLB-IgA (Immunoglobulin A) ([id number])  Patient Instructions: 1)  Samples of Dexilant given to patient and Rx. sent to pharmacy x 11 RFs. 2)  Samples of Align given to take 1 by mouth once daily x 2 weeks. 3)  Stop Reglan Rx. 4)  Information on Gas and Gas prevention diet given to patient to read. 5)  Copy sent to : Julieanne Manson, MD, Dr. Sigmund Hazel 6)  The medication list was reviewed and reconciled.  All changed / newly prescribed medications were explained.  A complete medication list was provided to the patient / caregiver. Prescriptions: DEXILANT 60 MG CPDR (DEXLANSOPRAZOLE) Take 1 cap 30 min before breakfast  #30 x 11   Entered by:   Milford Cage NCMA   Authorized by:   Hilarie Fredrickson MD   Signed by:   Milford Cage NCMA on 12/08/2009   Method used:   Electronically to        [facility]* (retail)       [address]       [place], [place]  [id number]       Ph: [phone]       Fax: [phone]   RxID:   [id number]

## 2010-07-16 NOTE — Assessment & Plan Note (Signed)
Summary: ROUTINE F-UP/FH   History of Present Illness Visit Type: follow up Primary GI MD: Lina Sar MD Primary MD: Sigmund Hazel History of Present Illness:  this is a followup visit for irritable bowel syndrome and gastroparesis, patient was last seen 6 months ago, her symptoms have improved she has learned how to eat  with her gastroparesis . patient eats small frequent feedings but has not taken her Reglan  because she thinks it does not help.She is in today to follow up.              Updated Prior Medication List: ASPIRIN 81 MG  TBEC (ASPIRIN) Take 1 tablet by mouth once a day ZETIA 10 MG  TABS (EZETIMIBE) Take 1 tablet by mouth once a day PAXIL CR 12.5 MG  TB24 (PAROXETINE HCL) Take 1 tablet by mouth once a day ALAVERT 10 MG  TABS (LORATADINE) Take 1 tablet by mouth once a day TAMOXIFEN CITRATE 20 MG  TABS (TAMOXIFEN CITRATE) Take 1 tablet by mouth once a day NEXIUM 40 MG  CPDR (ESOMEPRAZOLE MAGNESIUM) Take 1 tablet by two times a day ATENOLOL 25 MG  TABS (ATENOLOL) Take 1 tablet by mouth once a day MULTIVITAMINS   TABS (MULTIPLE VITAMIN) Take 1 tablet by mouth once a day VIACTIV 500-100-40  CHEW (CALCIUM-VITAMIN D-VITAMIN K) Take 1 tablet by mouth two times a day * VITAMIN C Take 1 tablet two times a day NASONEX 50 MCG/ACT  SUSP (MOMETASONE FUROATE) take as needed * GAS-X take as needed DRAMAMINE 50 MG  TABS (DIMENHYDRINATE) take as needed FISH OIL   OIL (FISH OIL) take 2 tablets twice daily  Current Allergies (reviewed today): ! AMOXICILLIN  Past Medical History:    Reviewed history from 08/04/2007 and no changes required:       Current Problems:        GASTROPARESIS (ICD-536.3)       ABDOMINAL BLOATING (ICD-787.3)       OVARIAN CYST (ICD-620.2)       IRRITABLE BOWEL SYNDROME (ICD-564.1)       HYPERLIPIDEMIA (ICD-272.4)       ADENOCARCINOMA, BREAST (ICD-174.9)       GASTRIC POLYP (ICD-211.1)       DIVERTICULOSIS, COLON (ICD-562.10)         Past Surgical  History:    Reviewed history from 08/04/2007 and no changes required:       Tonsillectomy       Partial Hysterectomy       Breast lumpectomy       Appendectomy       Rt ovarian cystectomy   Family History:    Reviewed history and no changes required:       Family History of Breast Cancer: paternal grandmother, paternal aunt       No FH of Colon Cancer       Family History of Prostate Cancer: father       Family History of Diabetes: grandmother, brother       Family History of Heart Disease:  father, brother  Social History:    Reviewed history and no changes required:       Patient has never smoked.        Alcohol Use - yes 2 glasses weekly       Daily Caffeine Use 2 cups daily       Illicit Drug Use - no       Patient gets regular exercise.   Risk Factors:  Tobacco use:  never Drug use:  no Alcohol use:  yes Exercise:  yes   Review of Systems       The patient complains of weight gain and chest pain.  The patient denies abdominal pain.         occasional diarrhea attributed to IBS   Vital Signs:  Patient Profile:   55 Years Old Female Height:     62 inches Weight:      152 pounds BMI:     27.90 Pulse rate:   66 / minute Pulse rhythm:   regular BP sitting:   112 / 70  (left arm)  Pt. in pain?   no  Vitals Entered By: Merri Ray CMA (Oct 25, 2007 8:16 AM)                   Physical Exam  General:     Well developed, well nourished, no acute distress. Neck:     Supple; no masses or thyromegaly. Lungs:     Clear throughout to auscultation. Heart:     Regular rate and rhythm; no murmurs, rubs,  or bruits. Abdomen:     normal bowel sounds and without rebound.   tenderness epigastrium and Left and the right LQ, no distention, liver egde at the costal margin Extremities:     No clubbing, cyanosis, edema or deformities noted. Neurologic:     alert and oriented    Impression & Recommendations:  Problem # 1:  GASTROPARESIS  (ICD-536.3) much improved, pt not taking Reglan due to no noticeable improvement, she is on gastroparesis diet, no further GI evaluation is planned  Problem # 2:  IRRITABLE BOWEL SYNDROME (ICD-564.1)  Her updated medication list for this problem includes:    Paxil Cr 12.5 Mg Tb24 (Paroxetine hcl) .Marland Kitchen... Take 1 tablet by mouth once a day    Nexium 40 Mg Cpdr (Esomeprazole magnesium) .Marland Kitchen... Take 1 tablet by mouth once a day occasional diarrhea, may be related to IBS or to diverticulosis found on the colonoscopy in 2006, Plan to add Psyllium fiber , samples of Benefiber given  Medications Added to Medication List This Visit: 1)  Nexium 40 Mg Cpdr (Esomeprazole magnesium) .... Take 1 tablet by mouth once a day 2)  Fish Oil Oil (Fish oil) .... Take 2 tablets twice daily   Patient Instructions: 1)  follow up as needed,      ]

## 2010-07-16 NOTE — Progress Notes (Signed)
Summary: triage / complaints  Phone Note Call from Patient Call back at [phone]   Caller: Patient Call For: [name] Reason for Call: Talk to Nurse Summary of Call: Patient wants to speak to nurse regarding problems that she's having with indiestion, wt loss and medication is not helping. Initial call taken by: Tawni Levy,  Nov 06, 2009 10:25 AM  Follow-up for Phone Call        Switched from Dr.Brodie and her first appt. is with you on 12/08/09. Symptoms started with chest pain on 10/11/09 and she was in [facility]  5/1/and 5/2 and cardiac cause ruled out.  She has been on protonix q.d. for years and it does not touch it.Describes pain as a smoldering fire in the back of her throat and any food causes a "massive event of indigestion. Has lost 8 lbs. in the past 10 days because of this and that she alwys feels full and as soon as she eats she gets bloated. Has gone to frequent sm. meals. Follow-up by: Teryl Lucy RN,  Nov 06, 2009 11:18 AM  Additional Follow-up for Phone Call Additional follow up Details #1::        As I'm not familiar w/ pt (she wanted to switch MD's), she'll need to wait for office eval. If that's not soon enough, could offer visit w/ extender. Additional Follow-up by: Hilarie Fredrickson MD,  Nov 06, 2009 11:24 AM    Additional Follow-up for Phone Call Additional follow up Details #2::    Given appt. with PA for tomorrow. Follow-up by: Teryl Lucy RN,  Nov 06, 2009 11:42 AM

## 2010-07-16 NOTE — Procedures (Signed)
Summary: Upper Endoscopy  Patient: [name] Note: All result statuses are Final unless otherwise noted.  Tests: (1) Upper Endoscopy (EGD)   EGD Upper Endoscopy       DONE     [facility]     [address]     [place], [place]  [address]           ENDOSCOPY PROCEDURE REPORT           PATIENT:  [name], [name]  MR#:  [id number]     BIRTHDATE:  03-11-1956, 53 yrs. old  GENDER:  female           ENDOSCOPIST:  Wilhemina Bonito. Eda Keys, MD     Referred by:  Office / Self           PROCEDURE DATE:  11/13/2009     PROCEDURE:  EGD, diagnostic     ASA CLASS:  Class II     INDICATIONS:  chest pain - BURNING SENSATION; FULLNESS           MEDICATIONS:   Fentanyl 75 mcg IV, Versed 6 mg IV     TOPICAL ANESTHETIC:  Exactacain Spray           DESCRIPTION OF PROCEDURE:   After the risks benefits and     alternatives of the procedure were thoroughly explained, informed     consent was obtained.  The LB GIF-H180 [id number] endoscope was     introduced through the mouth and advanced to the third portion of     the duodenum, without limitations.  The instrument was slowly     withdrawn as the mucosa was fully examined.     <<PROCEDUREIMAGES>>           The upper, middle, and distal third of the esophagus were     carefully inspected and no abnormalities were noted. The z-line     was well seen at the GEJ. The endoscope was pushed into the fundus     which was normal including a retroflexed view. The antrum,gastric     body, first and second part of the duodenum were unremarkable     except for multiple incidental small benign fundic gland type     polyps.    Retroflexed views revealed no abnormalities.    The     scope was then withdrawn from the patient and the procedure     completed.           COMPLICATIONS:  None           ENDOSCOPIC IMPRESSION:     1) Essentially Normal EGD     2) Gerd     3) Suspect pain due to breakthrough GERD or transient pill     esophagitis (now resolved)          RECOMMENDATIONS:     1) continue current medications. May stop carafate after     completing current supply. Continue on Dexilant indefinitely. If     you need a script or refill, please call the office     2) Take your medications w/ plenty of water     3) Follow up as needed           ______________________________     Wilhemina Bonito. Eda Keys, MD           CC:  Sigmund Hazel, MD, The Patient           n.     eSIGNED:  Wilhemina Bonito. Eda Keys at 11/13/2009 02:03 PM           Rosita Kea, [id number]  Note: An exclamation mark (!) indicates a result that was not dispersed into the flowsheet. Document Creation Date: 11/13/2009 2:03 PM _______________________________________________________________________  (1) Order result status: Final Collection or observation date-time: 11/13/2009 13:52 Requested date-time:  Receipt date-time:  Reported date-time:  Referring Physician:   Ordering Physician: Fransico Setters [id number]) Specimen Source:  Source: Launa Grill Order Number: [id number] Lab site:

## 2010-07-16 NOTE — Discharge Summary (Signed)
Summary: CHEST PAIN    NAME:  [name], [name]               ACCOUNT NO.:  [id number]      MEDICAL RECORD NO.:  [id number]          PATIENT TYPE:  INP      LOCATION:  3733                         FACILITY:  [facility]      PHYSICIAN:  Thereasa Solo. Little, M.D. DATE OF BIRTH:  Jan 08, 1956      DATE OF ADMISSION:  10/12/2009   DATE OF DISCHARGE:  10/13/2009                                  DISCHARGE SUMMARY      DISCHARGE DIAGNOSES:   1. Chest pain, no obstructive coronary disease noted at       catheterization with only mild myocardial bridging.   2. History of palpitations, treated with beta-blocker.   3. Family history of coronary disease, her brother had bypass surgery       in his 59s.   4. Treated dyslipidemia with a history of a CRESTOR intolerance,       currently on Zetia.   5. History of right breast cancer status post lumpectomy and radiation       therapy in 2006.   6. PENICILLIN allergy.      HOSPITAL COURSE:  The patient is a pleasant 55 year old female who has   been followed by Dr. [name].  She has a history of PVCs and mitral valve   prolapse.  She does use SBE prophylaxis.  She had a Myoview about 3   years ago that reportedly was negative.  She sees Dr. [name] on an   annual basis.  She has no documented coronary disease, but she does have   risk factors for coronary disease.  She had chest pain on the day of   admission which recurred during the middle of the night and was   worrisome for unstable angina.  Please see admission history and   physical for complete details.  She was admitted through the emergency   room, started on heparin and nitrates, set up for diagnostic   catheterization.      Laboratory data revealed negative cardiac enzymes.  TSH was 1.43.  Liver   functions were normal.  Sodium 142, potassium 3.8, BUN 13, creatinine   0.9.  Hemoglobin A1c is 5.7.  INR is 0.98.  White count is 7.4,   hemoglobin 13, hematocrit 36.3, platelets 247.  Cholesterol is 134,    triglycerides 347, HDL 33, LDL 32.  Chest x-ray shows no acute findings.   EKG showed sinus rhythm without acute changes.      DISPOSITION:  The patient is discharged in stable condition.  Dr. Tresa Endo   wants her on a calcium blocker for possibility of coronary spasm, and we   will also send her home on nitroglycerin p.r.n.  She will continue her   other home medicines.  She will follow up with Dr. [name] in a couple of   weeks.               Abelino Derrick, P.A.         ______________________________   Thereasa Solo. Little, M.D.  LKK/MEDQ  D:  10/13/2009  T:  10/14/2009  Job:  [id number]      Electronically Signed by Corine Shelter P.A. on 10/21/2009 04:31:34 PM   Electronically Signed by Julieanne Manson M.D. on 11/18/2009 03:08:55 PM

## 2010-07-16 NOTE — Assessment & Plan Note (Signed)
Summary: severe heartburn/wt. loss-Perry pt.   History of Present Illness Visit Type: Follow-up Visit Primary GI MD: Yancey Flemings MD Primary Provider: Sigmund Hazel Requesting Provider: na Chief Complaint: GERD and weight loss  History of Present Illness:   55 YO FEMALE KNOWN TO DR. PERRY WITH HX OF GERD AND PREVIOUS DX OF GASTROPARESIS,LAST SEEN HERE IN 2009. SHE COMES IN TODAY WITH C/O TERRIBLE HEARTBURN. SHE HAD AN EPISODE ABOUT A MONTH AGO WITH CHEST PAIN THAT TOOK HER TO THE ER. SHE UNDERWENT CARDIAC EVAL PER DR. AL LITTLE-WITH NEGATIVE CATH. SHE HAD BEEN ON NEXIUMPREVIOUSLY ,MORE RECENTLY OTC PRILOSEC TWICE DAILY. AFTER SHE WAS HOSPITALIZED SHE WENT HOME ON PROTONIX 40 MG DAILY. HER SX ARE NOT CONTROLLED. SHE C/O DAILY SXS WITH BURNING IN THE CENTER OF HER CHEST-LIKE AN" EMBER",WORSE WITH EATING OR DRINKING. SHE HAS A FULLNESS AND TIGHTNESS IN HER LOWER CHEST AND EPIGASTRIUM,SAYS SHE FEELS BAD ALL THE TIME.APPETITE DECREASED,WEIGHT DOWN 10 POUNDS. PRIOR TO THE HOSPITALIZATION SHE WAS HAVING OCCASIONAL SPELLS OF HEARTBURN AND CHEST PAIN,NOW DAILY. NO RECENT ABX, NO NSAIDS. SHE HAD BEEN ON REGLAN IN 2009-DOESNT REMEMBER IF IT HELPED MUCH.   GI Review of Systems    Reports abdominal pain, acid reflux, bloating, chest pain, dysphagia with liquids, dysphagia with solids, heartburn, loss of appetite, nausea, and  weight loss.     Location of  Abdominal pain: upper abdomen. Weight loss of 10 pounds over 2 WEEKS.   Denies belching, vomiting, and  vomiting blood.        Denies anal fissure, black tarry stools, change in bowel habit, constipation, diarrhea, diverticulosis, fecal incontinence, heme positive stool, hemorrhoids, irritable bowel syndrome, jaundice, light color stool, liver problems, rectal bleeding, and  rectal pain.    Current Medications (verified): 1)  Aspirin 81 Mg  Tbec (Aspirin) .... Take 1 Tablet By Mouth Once A Day 2)  Zetia 10 Mg  Tabs (Ezetimibe) .... Take 1 Tablet By Mouth Once  A Day 3)  Paxil Cr 12.5 Mg  Tb24 (Paroxetine Hcl) .... Take 1 Tablet By Mouth Once A Day 4)  Tamoxifen Citrate 20 Mg  Tabs (Tamoxifen Citrate) .... Take 1 Tablet By Mouth Once A Day 5)  Pantoprazole Sodium 40 Mg Tbec (Pantoprazole Sodium) .... One Tablet By Mouth Once Daily 6)  Atenolol 25 Mg  Tabs (Atenolol) .... Take 1 Tablet By Mouth Once A Day 7)  Multivitamins   Tabs (Multiple Vitamin) .... Take 1 Tablet By Mouth Once A Day 8)  Viactiv 500-100-40  Chew (Calcium-Vitamin D-Vitamin K) .... Take 1 Tablet By Mouth Two Times A Day 9)  Vitamin C .... Take 1 Tablet Two Times A Day 10)  Nasonex 50 Mcg/act  Susp (Mometasone Furoate) .... Take As Needed 11)  Dramamine 50 Mg  Tabs (Dimenhydrinate) .... Take As Needed 12)  Fish Oil   Oil (Fish Oil) .... One Tablet By Mouth Two Times A Day 13)  Amlodipine Besylate 5 Mg Tabs (Amlodipine Besylate) .... One Tablet By Mouth Once Daily 14)  Black Cohosh 40 Mg Caps (Black Cohosh) .... One Tablet By Mouth Two Times A Day 15)  Ambien 10 Mg Tabs (Zolpidem Tartrate) .... As Needed 16)  Nitrostat 0.4 Mg Subl (Nitroglycerin) .... As Needed  Allergies (verified): 1)  ! Amoxicillin  Past History:  Past Medical History: Current Problems:  GASTROPARESIS (ICD-536.3) GERD OVARIAN CYST (ICD-620.2) IRRITABLE BOWEL SYNDROME (ICD-564.1) HYPERLIPIDEMIA (ICD-272.4) ADENOCARCINOMA, BREAST (ICD-174.9) GASTRIC POLYP (ICD-211.1) DIVERTICULOSIS, COLON (ICD-562.10) HTN  Past Surgical History: Reviewed history  from 08/04/2007 and no changes required. Tonsillectomy Partial Hysterectomy Breast lumpectomy Appendectomy Rt ovarian cystectomy  Family History: Reviewed history from 10/25/2007 and no changes required. Family History of Breast Cancer: paternal grandmother, paternal aunt No FH of Colon Cancer Family History of Prostate Cancer: father Family History of Diabetes: grandmother, brother Family History of Heart Disease:  father, brother  Social  History: Reviewed history from 10/25/2007 and no changes required. Patient has never smoked.  Alcohol Use - yes 2 glasses weekly Daily Caffeine Use 2 cups daily Illicit Drug Use - no Patient gets regular exercise.  Review of Systems       The patient complains of back pain.  The patient denies allergy/sinus, anemia, anxiety-new, arthritis/joint pain, blood in urine, breast changes/lumps, change in vision, confusion, cough, coughing up blood, depression-new, fainting, fatigue, fever, headaches-new, hearing problems, heart murmur, heart rhythm changes, itching, menstrual pain, muscle pains/cramps, night sweats, nosebleeds, pregnancy symptoms, shortness of breath, skin rash, sleeping problems, sore throat, swelling of feet/legs, swollen lymph glands, thirst - excessive , urination - excessive , urination changes/pain, urine leakage, vision changes, and voice change.         ROS OTHERWISE AS IN HPI  Vital Signs:  Patient profile:   55 year old female Height:      62 inches Weight:      154 pounds BMI:     28.27 BSA:     1.71 Pulse rate:   64 / minute Pulse rhythm:   regular BP sitting:   108 / 68  (left arm) Cuff size:   regular  Vitals Entered By: Ok Anis CMA (Nov 07, 2009 1:43 PM)  Physical Exam  General:  Well developed, well nourished, no acute distress. Head:  Normocephalic and atraumatic. Eyes:  PERRLA, no icterus. Neck:  Supple; no masses or thyromegaly. Lungs:  Clear throughout to auscultation. Heart:  Regular rate and rhythm; no murmurs, rubs,  or bruits. Abdomen:  SOFT,MILD TENDERNESS EPIGASTRIUM, NO GUARDING, NO MASS OF HSM,BS+ Rectal:  NOT DONE Extremities:  No clubbing, cyanosis, edema or deformities noted. Neurologic:  Alert and  oriented x4;  grossly normal neurologically. Psych:  Alert and cooperative. Normal mood and affect.   Impression & Recommendations:  Problem # 1:  HEARTBURN (ICD-787.1) Assessment Deteriorated 55 Y.O FEMALE WITH HX OF GERD,AND  GASTROPARESIS WITH  RECENT INCREASE IN SXS, NOW UNCONTROLLED WITH CURRENT REGIMEN,AND ASSOCIATED WITH CHEST PAIN (NEG CARDIAC EVAL 10/11/09),AND WT LOSS. SUSPECT SXS ARE SECONDARY TO EXACERBATION OF GASTROPARESIS,WITH ESOPHAGITIS,R/O PUD , R/O GB DISEASE.  SCHEDULE FOR UPPER ABDOMINAL US TRIAL OF DEXILANT 60 MG DAILY IN AM (SAMPLES GIVEN) CARAFATE SLURRY 1 GM BETWEEN MEALS AND AT BEDTIME X 2 WEEKS GASTROPARESIS DIET RE TRY REGLAN 5 MG  30 MIN BEFORE MEALS AND BEDTIME (MAY NEED HIGHER DOSE IF INEFFECTIVE) PHENERGAN 12.5-25 MG  Q 6 HOURS AS NEEDED FOR NAUSEA SCHEDULE FOR UPPER ENDOSCOPY WITH DR. PERRY-PROCEDURE DISCUSSED IN DETAIL WITH PT AND HUSBAND,RISKS AND ALTERNATIVES REVIEWED.  Problem # 2:  WEIGHT LOSS-ABNORMAL (ICD-783.21) Assessment: Comment Only SEE ABOVE  Problem # 3:  ANXIETY (ICD-300.00) Assessment: Comment Only  Problem # 4:  PERSONAL HX BREAST CANCER (ICD-V10.3) Assessment: Comment Only ON TAMOXIFEN  Problem # 5:  DIVERTICULOSIS, COLON (ICD-562.10) Assessment: Comment Only LAST COLON  1/06-FOLLOW UP 2016  Other Orders: TLB-CMP (Comprehensive Metabolic Pnl) (80053-COMP) TLB-CBC Platelet - w/Differential (85025-CBCD) TLB-Lipase (83690-LIPASE)  Patient Instructions: 1)  Your physician has requested that you have the following labwork done today: Go to basement leve.  2)  We scheduled the Ultrasound at [facility] for 11-12-09. 3)  Directions given. 4)  We sent perscriptions for Phenergan, Reglan, Carafate to [facility].  5)  We gave given you samples of Dexilant, take 1 cap 30 min before breakfast. 6)  We will call you with a date for Endoscopy with Dr. Marina Goodell.  7)  Copy sent to : Sigmund Hazel, MD 8)  The medication list was reviewed and reconciled.  All changed / newly prescribed medications were explained.  A complete medication list was provided to the patient / caregiver. Prescriptions: CARAFATE 1 GM/10ML SUSP (SUCRALFATE) Take 1 gram between meals and before  bedtime  #120 gram x 0   Entered by:   Lowry Ram NCMA   Authorized by:   Sammuel Cooper PA-c   Signed by:   Lowry Ram NCMA on 11/07/2009   Method used:   Electronically to        [facility]* (retail)       [address]       [place], [place]  [id number]       Ph: [phone]       Fax: [phone]   RxID:   [id number] REGLAN 5 MG TABS (METOCLOPRAMIDE HCL) Take 1 tab  30 min before each meal  #90 x 1   Entered by:   Lowry Ram NCMA   Authorized by:   Sammuel Cooper PA-c   Signed by:   Lowry Ram NCMA on 11/07/2009   Method used:   Electronically to        [facility]* (retail)       [address]       [place], [place]  [id number]       Ph: [phone]       Fax: [phone]   RxID:   [id number] PROMETHAZINE HCL 12.5 MG TABS (PROMETHAZINE HCL) Take 1/2 to 1 tab every 6 hours as needed for nausea  #60 x 0   Entered by:   Lowry Ram NCMA   Authorized by:   Sammuel Cooper PA-c   Signed by:   Lowry Ram NCMA on 11/07/2009   Method used:   Electronically to        [facility]* (retail)       [address]       [place], [place]  [id number]       Ph: [phone]       Fax: [phone]   RxID:   [id number]

## 2010-07-16 NOTE — Progress Notes (Signed)
Summary: ? Dexilant side effects - LE edema  Phone Note Call from Patient Call back at [phone]   Caller: Patient Call For: Dr. Marina Goodell Reason for Call: Talk to Nurse Summary of Call: swelling in feet and legs... thinks from Dexilant Initial call taken by: Vallarie Mare,  January 02, 2010 9:08 AM  Follow-up for Phone Call        Was put on dexilant 11/07/09 and about 4 weeks later noticed swelling  from knees down which decreased overnight with swelling only in ankles in the a.m. [place] the [place] and said swelling from right above knees to feet  is markedly increased and not going down much overnight.Denies doing a lot of walking but has been in the heat Follow-up by: Teryl Lucy RN,  January 02, 2010 10:27 AM  Additional Follow-up for Phone Call Additional follow up Details #1::        This would be an unusual side effect of this medication. She is welcome to to stop it, but must see her PCP ASAP to more thoroughly evaluate lower extremity edema Additional Follow-up by: Hilarie Fredrickson MD,  January 02, 2010 10:43 AM    Additional Follow-up for Phone Call Additional follow up Details #2::    Pt. ntfd. of Dr.Perry's orders. Follow-up by: Teryl Lucy RN,  January 02, 2010 11:05 AM

## 2010-07-16 NOTE — Procedures (Signed)
Summary: COLON  Gastroenterology Colonoscopy   Imported By: Milford Cage CMA 08/07/2007 09:07:25  _____________________________________________________________________  External Attachment:    Type:   Image     Comment:   External Document

## 2010-07-16 NOTE — Miscellaneous (Signed)
Summary: [facility] Previsit/prep  Clinical Lists Changes  Allergies: Changed allergy or adverse reaction from AMOXICILLIN to AMOXICILLIN Observations: Added new observation of ALLERGY REV: Done (11/12/2009 8:35)

## 2010-07-16 NOTE — Procedures (Signed)
Summary: EGD  Gastroenterology EGD   Imported By: Milford Cage CMA 08/07/2007 09:05:30  _____________________________________________________________________  External Attachment:    Type:   Image     Comment:   External Document  Appended Document: EGD H PYLORI ---- NEGATIVE

## 2010-07-16 NOTE — Letter (Signed)
Summary: EGD Instructions  [facility]  [address] [place], [place] [address]   Phone: [phone]  Fax: [phone]       [name]    08/14/1955    MRN: [id number]       Procedure Day [name]:  [name]  11/13/09     Arrival Time: 12:30pm     Procedure Time:  1:30pm     Location of Procedure:                    [facility] (4th Floor)    PREPARATION FOR ENDOSCOPY   On THURSDAY 06/02,  THE DAY OF THE PROCEDURE:  1.   No solid foods, milk or milk products are allowed after midnight the night before your procedure.  2.   Do not drink anything colored red or purple.  Avoid juices with pulp.  No orange juice.  3.  You may drink clear liquids until 11:30am , which is 2 hours before your procedure.                                                                                                CLEAR LIQUIDS INCLUDE: Water Jello Ice Popsicles Tea (sugar ok, no milk/cream) Powdered fruit flavored drinks Coffee (sugar ok, no milk/cream) Gatorade Juice: apple, white grape, white cranberry  Lemonade Clear bullion, consomm, broth Carbonated beverages (any kind) Strained chicken noodle soup Hard Candy   MEDICATION INSTRUCTIONS  Unless otherwise instructed, you should take regular prescription medications with a small sip of water as early as possible the morning of your procedure.               OTHER INSTRUCTIONS  You will need a responsible adult at least 57 years of age to accompany you and drive you home.   This person must remain in the waiting room during your procedure.  Wear loose fitting clothing that is easily removed.  Leave jewelry and other valuables at home.  However, you may wish to bring a book to read or an iPod/MP3 player to listen to music as you wait for your procedure to start.  Remove all body piercing jewelry and leave at home.  Total time from sign-in until discharge is approximately 2-3 hours.  You  should go home directly after your procedure and rest.  You can resume normal activities the day after your procedure.  The day of your procedure you should not:   Drive   Make legal decisions   Operate machinery   Drink alcohol   Return to work  You will receive specific instructions about eating, activities and medications before you leave.    The above instructions have been reviewed and explained to me by   Wyona Almas RN  November 12, 2009 9:02 AM     I fully understand and can verbalize these instructions _____________________________ Date _________

## 2010-09-01 LAB — LIPID PANEL
Cholesterol: 134 mg/dL (ref 0–200)
HDL: 33 mg/dL — ABNORMAL LOW (ref >39)
LDL Cholesterol: 32 mg/dL (ref 0–99)
Total CHOL/HDL Ratio: 4.1 RATIO
Triglycerides: 347 mg/dL — ABNORMAL HIGH (ref <150)
VLDL: 69 mg/dL — ABNORMAL HIGH (ref 0–40)

## 2010-09-01 LAB — CBC
HCT: 35 % — ABNORMAL LOW (ref 36.0–46.0)
HCT: 36.3 % (ref 36.0–46.0)
Hemoglobin: 12.2 g/dL (ref 12.0–15.0)
Hemoglobin: 13 g/dL (ref 12.0–15.0)
MCHC: 35 g/dL (ref 30.0–36.0)
MCHC: 35.8 g/dL (ref 30.0–36.0)
MCV: 95.9 fL (ref 78.0–100.0)
MCV: 96.6 fL (ref 78.0–100.0)
Platelets: 225 10*3/uL (ref 150–400)
Platelets: 247 10*3/uL (ref 150–400)
RBC: 3.62 MIL/uL — ABNORMAL LOW (ref 3.87–5.11)
RBC: 3.79 MIL/uL — ABNORMAL LOW (ref 3.87–5.11)
RDW: 13.2 % (ref 11.5–15.5)
RDW: 13.4 % (ref 11.5–15.5)
WBC: 10.4 10*3/uL (ref 4.0–10.5)
WBC: 7.4 10*3/uL (ref 4.0–10.5)

## 2010-09-01 LAB — DIFFERENTIAL
Basophils Absolute: 0 10*3/uL (ref 0.0–0.1)
Basophils Relative: 1 % (ref 0–1)
Eosinophils Absolute: 0.2 10*3/uL (ref 0.0–0.7)
Eosinophils Relative: 3 % (ref 0–5)
Lymphocytes Relative: 32 % (ref 12–46)
Lymphs Abs: 2.4 10*3/uL (ref 0.7–4.0)
Monocytes Absolute: 0.7 10*3/uL (ref 0.1–1.0)
Monocytes Relative: 9 % (ref 3–12)
Neutro Abs: 4.1 10*3/uL (ref 1.7–7.7)
Neutrophils Relative %: 55 % (ref 43–77)

## 2010-09-01 LAB — POCT I-STAT, CHEM 8
BUN: 17 mg/dL (ref 6–23)
Calcium, Ion: 1.16 mmol/L (ref 1.12–1.32)
Chloride: 108 mEq/L (ref 96–112)
Creatinine, Ser: 0.8 mg/dL (ref 0.4–1.2)
Glucose, Bld: 101 mg/dL — ABNORMAL HIGH (ref 70–99)
HCT: 40 % (ref 36.0–46.0)
Hemoglobin: 13.6 g/dL (ref 12.0–15.0)
Potassium: 3.7 mEq/L (ref 3.5–5.1)
Sodium: 143 mEq/L (ref 135–145)
TCO2: 26 mmol/L (ref 0–100)

## 2010-09-01 LAB — BASIC METABOLIC PANEL
BUN: 15 mg/dL (ref 6–23)
CO2: 25 mEq/L (ref 19–32)
Calcium: 9.3 mg/dL (ref 8.4–10.5)
Chloride: 108 mEq/L (ref 96–112)
Creatinine, Ser: 0.82 mg/dL (ref 0.4–1.2)
GFR calc Af Amer: >60 mL/min (ref >60)
GFR calc non Af Amer: >60 mL/min (ref >60)
Glucose, Bld: 101 mg/dL — ABNORMAL HIGH (ref 70–99)
Potassium: 3.6 mEq/L (ref 3.5–5.1)
Sodium: 141 mEq/L (ref 135–145)

## 2010-09-01 LAB — COMPREHENSIVE METABOLIC PANEL
ALT: 29 U/L (ref 0–35)
AST: 32 U/L (ref 0–37)
Albumin: 3.9 g/dL (ref 3.5–5.2)
Alkaline Phosphatase: 54 U/L (ref 39–117)
BUN: 13 mg/dL (ref 6–23)
CO2: 27 mEq/L (ref 19–32)
Calcium: 9.2 mg/dL (ref 8.4–10.5)
Chloride: 109 mEq/L (ref 96–112)
Creatinine, Ser: 0.9 mg/dL (ref 0.4–1.2)
GFR calc Af Amer: >60 mL/min (ref >60)
GFR calc non Af Amer: >60 mL/min (ref >60)
Glucose, Bld: 97 mg/dL (ref 70–99)
Potassium: 3.8 mEq/L (ref 3.5–5.1)
Sodium: 142 mEq/L (ref 135–145)
Total Bilirubin: 0.7 mg/dL (ref 0.3–1.2)
Total Protein: 6.6 g/dL (ref 6.0–8.3)

## 2010-09-01 LAB — POCT CARDIAC MARKERS
CKMB, poc: <1 ng/mL — ABNORMAL LOW (ref 1.0–8.0)
CKMB, poc: <1 ng/mL — ABNORMAL LOW (ref 1.0–8.0)
Myoglobin, poc: 38.2 ng/mL (ref 12–200)
Myoglobin, poc: 48.7 ng/mL (ref 12–200)
Troponin i, poc: <0.05 ng/mL (ref 0.00–0.09)
Troponin i, poc: <0.05 ng/mL (ref 0.00–0.09)

## 2010-09-01 LAB — HEPARIN LEVEL (UNFRACTIONATED)
Heparin Unfractionated: 0.13 IU/mL — ABNORMAL LOW (ref 0.30–0.70)
Heparin Unfractionated: 0.49 IU/mL (ref 0.30–0.70)

## 2010-09-01 LAB — PROTIME-INR
INR: 0.98 (ref 0.00–1.49)
INR: 1.01 (ref 0.00–1.49)
Prothrombin Time: 12.9 seconds (ref 11.6–15.2)
Prothrombin Time: 13.2 seconds (ref 11.6–15.2)

## 2010-09-01 LAB — TSH: TSH: 1.434 u[IU]/mL (ref 0.350–4.500)

## 2010-09-01 LAB — HEMOGLOBIN A1C
Hgb A1c MFr Bld: 5.7 % — ABNORMAL HIGH (ref <5.7)
Mean Plasma Glucose: 117 mg/dL — ABNORMAL HIGH (ref <117)

## 2010-09-01 LAB — APTT: aPTT: 25 seconds (ref 24–37)

## 2010-09-10 ENCOUNTER — Other Ambulatory Visit: Payer: Self-pay | Admitting: Obstetrics and Gynecology

## 2010-10-27 NOTE — Assessment & Plan Note (Signed)
[facility]                         GASTROENTEROLOGY OFFICE NOTE   NAME:[name], [name]                        MRN:          [id number]  DATE:04/18/2007                            DOB:          [birth year]    [name] is a 55 year old white female with sudden onset of bloating  and abdominal distension following acute gastroenteritis in June 2008.  She, since then, has had a rather extensive GI workup with no definite  diagnosis.  The only abnormal test has been a somewhat abnormal gastric  emptying scan showing retention of 50% of the gastric activity after 2  hours, normal being less than 30%.  She had an upper endoscopy October  2008, which did not show any food retention, but she has small fundic  gland polyps in the proximal stomach.  The patient has been put on  metoclopramide initially 5 mg a day and subsequently 10 mg a day before  meals.  She could not tolerate the 10 mg dose and had to cut back to 5  mg 3 times a day.  She is about 30-40% improved.  She denies any lower  GI dysfunction.  Her colonoscopy in January 2006 for neoplastic  screening showed mild to moderately severe diverticulosis of the left  colon.   MEDICATIONS:  1. Aspirin 81 mg p.o. daily.  2. Zetia 10 mg p.o. daily.  3. Paxil 12.5 mg p.o. daily.  4. Alavert.  5. Tamoxifen 20 mg p.o. daily.  6. Nexium 40 mg p.o. b.i.d.  7. Atenolol 25 mg p.o. daily.   PHYSICAL EXAM:  Blood pressure 110/68, pulse 56, weight 143.8 pounds.  She was alert and oriented, in no distress.  LUNGS:  Clear to auscultation.  COR:  Normal S1, normal S2.  The patient had radiation to the right breast with skin radiation  changes and tattoos.  ABDOMEN:  Soft, nondistended.  According to the patient, it was  distended, but her bowel sounds were normoactive and was diffusely  mildly tender.  Right upper quadrant was normal with liver edge at  costal margin.  RECTAL:  Not repeated.   IMPRESSION:  22. A 55 year old white female with nonspecific bloating and      distension.  Essentially negative gastrointestinal workup, except      for mild gastroparesis of unknown etiology, possibly related to      Paxil or possibly related to post-viral gastroenteritis with      gastroparesis.  2. Mild weight loss of 6 pounds since last appointment.  3. Known diverticulosis of the left colon.  4. History of breast cancer status post radiation right breast.   PLAN:  1. TSH levels today.  2. Probiotic samples 1 p.o. daily.  3. Continue Reglan.  4. Decrease Nexium to 40 mg p.o. daily.  I will see her again in about      3 months.     Hedwig Morton. Juanda Chance, MD  Electronically Signed    DMB/MedQ  DD: 04/18/2007  DT: 04/19/2007  Job #: [id number]   cc:   Sigmund Hazel, M.D.

## 2010-10-27 NOTE — Assessment & Plan Note (Signed)
[facility]                         GASTROENTEROLOGY OFFICE NOTE   NAME:[name], [name]                        MRN:          [id number]  DATE:01/03/2007                            DOB:          Jun 06, 1956    [name] is a 55 year old female with left-sided diverticulosis,  confirmed on screening colonoscopy in January, 2006, who had an acute  pelvic pain on December 23, 2006, which showed to be a large complex ovarian  cyst measuring 4 x 3 x 5 cm.  She is being evaluated by Dr. Henderson Cloud and  is scheduled for arthroscopic exam and cyst removal this coming week.  [name] has a history of breast cancer and has been on tamoxifen.  She was given Gas-X and dietary restrictions and has gradually improved,  until today.  She is in no distress, awaiting surgery in a couple of  days.  Her bowel habits are regular.   PHYSICAL EXAMINATION:  Blood pressure 120/72, pulse 64.  Weight 147  pounds.  She is alert and oriented in no distress.  Abdomen today still shows tenderness in the left lower quadrant on deep  pressure but no rebound.  Bowel sounds were normoactive.  Right upper  and lower quadrants were unremarkable.  RECTAL:  Not repeated.   IMPRESSION:  49. A 55 year old white female with left pelvic mass, probably benign.      Schedule for arthroscopic exam this week by Dr. Henderson Cloud.  2. History of mild diverticulosis of the left colon.  There is no      evidence on CT scan of diverticulitis.  3. Irritable bowel syndrome.  This could be related to the ovarian      cyst, placing some pressure on the left colon.  Her symptoms are      markedly improved now.   PLAN:  We will await results of the laparoscopic surgery.  Since she has  been symptomatically  no other evaluation is necessary at this time. If  there are any remaining GI issues after the surgery, we will be happy to  see her.     [name] Morton. Juanda Chance, MD  Electronically Signed    DMB/MedQ  DD:  01/03/2007  DT: 01/03/2007  Job #: [id number]   cc:   Carrington Clamp, M.D.  Sigmund Hazel, M.D.

## 2010-10-27 NOTE — Op Note (Signed)
NAMEJAYDON, SOROKA               ACCOUNT NO.:  [id number]   MEDICAL RECORD NO.:  [id number]          PATIENT TYPE:  AMB   LOCATION:  SDC                           FACILITY:  [facility]   PHYSICIAN:  Carrington Clamp, M.D. DATE OF BIRTH:  Nov 03, 1955   DATE OF PROCEDURE:  01/05/2007  DATE OF DISCHARGE:                               OPERATIVE REPORT   PREOPERATIVE DIAGNOSIS:  Left ovarian cyst.   POSTOPERATIVE DIAGNOSIS:  Left ovarian cyst.   PROCEDURE:  Operative laparoscopy, bilateral salpingo-oophorectomy,  lysis of adhesions.   SURGEON:  Carrington Clamp, M.D.   ASSISTANT:  Miguel Aschoff, M.D.   ANESTHESIA:  General.   SPECIMENS:  Right and left ovaries and tubes.   ESTIMATED BLOOD LOSS:  Minimal.   FLUIDS REPLACED:  1100 mL.   URINE OUTPUT:  Not measured.   COMPLICATIONS:  None.   FINDINGS:  5 cm ovary on the left that was a smooth serosal surface.  The bowel was adhesed to the ovary and tubes and the cul-de-sac.  The  ureters were seen bilaterally coursing well out of the field of  dissection and there were no other abnormalities seen in the pelvis.   COUNTS:  Correct x3.   MEDICATIONS:  Clindamycin prior to the procedure secondary to heart  murmur, Intercede.   SURGICAL TECHNIQUE:  After adequate general anesthesia was achieved, the  patient was prepped and draped in the usual sterile fashion in the  dorsal lithotomy position.  The bladder was emptied with the catheter  and a sponge stick placed in the vagina.  Attention was turned to the  abdomen where a 2 cm infraumbilical incision was made with the scalpel.  Allis clamps were used to tent up each successive layer and the fascia  was incised with the scalpel.  The corners of the fascia were secured  with two stitches of 2-0 Vicryl.  The peritoneum was then entered gently  with blunt dissection with my finger.  I swept my finger around the  incision revealing no adhesions.  The Hasson scope was then placed in  the  abdomen and two 5 mm trocars were placed on either side of the pubic  bone outside of the inferior epigastric arteries.  5 mm incisions were  made with the scalpel and then the scopes were passed into the abdomen  under direct visualization.   Scissors were used to incise filmy adhesions of the bowel to the cul-de-  sac and also over the left ovary.  This was done with careful dissection  and tension placed gently and bluntly on the bowel.  Once the left ovary  was free, the infundibulopelvic ligament was identified and found to be  way out of the area of the ureter.  This was then cauterized in two  places with the triple polar cautery.  The triple polar cautery was then  continued to remove the ovary from the peritoneum.  The ovary was placed  in an endobag and removed intact through the 10 mm trocar incision.  This was done with the aid of a 5 mm scope.  Once  again, the 10 mm scope  was placed in the abdomen and the right ovary infundibulopelvic ligament  was identified far away from the ureter.  This also was cauterized in  two places with the triple polar and excised with the blade of the  triple polar.  The left ovary and tube were then removed from the  remaining peritoneum without any complication and also removed through  the 10 mm trocar site.   Irrigation was then performed and no bleeding was seen.  The right  infundibulopelvic ligament had been cauterized again to ensure  hemostasis.  Two pieces of Intercede were placed over the left hand  portion of the peritoneum where the dissection for adhesions had been  performed and also the cul-de-sac where there were multiple adhesions  seen.  All instruments were then withdrawn from the abdomen and the  abdomen desufflated.  The 10 mm trocar site was closed with a running  lock stitch of 2-0 Vicryl.  The 5 mm skin incisions were closed with a  through and through stitch of 4-0 Vicryl Rapide.  Dermabond was used to  close the  incisions and everything was removed from the vagina.  The  patient tolerated the procedure well and was returned to the recovery  room in stable condition.      Carrington Clamp, M.D.  Electronically Signed     MH/MEDQ  D:  01/05/2007  T:  01/05/2007  Job:  [id number]

## 2010-10-27 NOTE — Assessment & Plan Note (Signed)
[facility]                         GASTROENTEROLOGY OFFICE NOTE   NAME:[name] [name]                        MRN:          [id number]  DATE:03/22/2007                            DOB:          10-23-1955    PROBLEM:  Abdominal distention and bloating.   HISTORY:  [name] [name] is a pleasant 55 year old white female who was last seen  in July 2008.  She does have a history of left-sided diverticulosis and  at that time was having pelvic pain, which was felt to be due to a large  complex ovarian cyst.  At that time she had been having some associated  urinary frequency, etc.  She has since undergone ovarian cystectomy on  the right.  She says that after her surgery her urinary frequency  resolved and her abdominal bloating and distention improved for a period  of time but has now recurred.  She says most of her symptoms are in the  upper abdomen with a feeling of fullness and bloating, which is fairly  constant.  She says she always feels worse postprandially, has been  having nausea without vomiting.  She has not had any heartburn or  indigestion.  She is having bowel movements regularly, generally on the  looser side over the past few months.  She says she feels miserable,  tired and irritable.  Since her ovarian cystectomy she has had an  abdominal ultrasound, which was negative, and a HIDA scan, which was  also negative.   CURRENT MEDICATIONS:  1. Aspirin 81 mg daily.  2. Zetia 10 mg daily.  3. Paxil 12.5 mg daily.  4. Alavert daily.  5. Tamoxifen 20 mg daily.  6. Atenolol 25 mg daily.  7. Multivitamin daily.  8. Vivactil 500 mg Plus D twice daily.  9. Nexium 40 mg b.i.d.  10.Vitamin C supplement twice daily.   ALLERGIES:  AMOXICILLIN, which causes a rash.   EXAM:  A well-developed white female in no acute distress.  Weight is 146.  Blood pressure 110/68, pulse is 56.  ABDOMEN:  Soft, not appreciably distended.  Bowel sounds are present.  There  is no succussion splash, no palpable mass or hepatosplenomegaly.   IMPRESSION:  56. A 55 year old white female with persistent abdominal bloating and      early satiety, etiology not clear.  Rule out underlying      gastroparesis or other motility disorder.  2. Status post right ovarian cystectomy July 2008.   PLAN:  1. Institute low-residue diet.  2. Start Reglan 5 mg one-half hour a.c.  3. Schedule upper endoscopy with Dr. Juanda Chance.  4. Schedule gastric emptying scan.  5. Continue Nexium 40 mg daily for the time being.      Mike Gip, PA-C  Electronically Signed      Barbette Hair. Arlyce Dice, MD,FACG  Electronically Signed   AE/MedQ  DD: 03/27/2007  DT: 03/28/2007  Job #: [id number]   cc:   Hedwig Morton. Juanda Chance, MD  Carrington Clamp, M.D.

## 2010-10-27 NOTE — Assessment & Plan Note (Signed)
[facility]                         GASTROENTEROLOGY OFFICE NOTE   NAME:[name], [name]                        MRN:          [id number]  DATE:12/23/2006                            DOB:          1956/04/21    REFERRED BY:  Dr. Carrington Clamp.   PROBLEM:  Abdominal discomfort, bloating, nausea x2 to 3 weeks.   HISTORY:  [name] is a pleasant 55 year old white female known remotely to  Dr. Lina Sar. She did have colonoscopy with Dr. Juanda Chance done for  screening in January 2006. She does have some left sided diverticula,  otherwise negative exam. The patient has a history of breast cancer and  was diagnosed in May 2006. She had lumpectomy x2, then radiation, and  has been disease free since. Her current symptoms started 2-3 weeks with  an episode of abdominal swelling and cramping followed by some loose  stools. These symptoms persisted for a day or two. She went to an urgent  care, was given an anti-spasmodic, and treated symptomatically. She had  seen her gynecologist last week because she had a yeast infection. She  did have a CT of the abdomen and pelvis done at [facility] which  shows a left ovarian cyst measuring 4 x 3 x 4.8 with increased density  dependently likely a mixture of protein or hemorrhage, unremarkable  right adnexa, mild generalized colonic wall prominence versus partial  contraction, otherwise negative exam. She says that over the past week  or so she has continued to have abdominal bloating, swelling, and says  that she feels about 6 months pregnant and heavy in her abdomen. She has  been somewhat nauseated. She has not been vomiting. May have been  running a low grade fever. She is having a couple of bowel movements per  day, but no overt diarrhea. She has not noted any melena or  hematochezia. She has had some frequent urination. She says that she was  checked for a bladder infection and that was negative. She has not been  on  any new medications, no antibiotics, etcetera.   CURRENT MEDICATIONS:  1. Aspirin 81 mg daily.  2. Zetia 10 mg daily.  3. Paxil 12.5 mg daily.  4. Alavert daily.  5. Tamoxifen 20 mg daily.  6. Nexium 40 mg daily.  7. Atenolol 25 mg daily.  8. Multivitamin daily.  9. Viactiv 500 b.i.d.  10.Calcium 500 2 daily.   ALLERGIES:  AMOXICILLIN which causes a rash.   PAST HISTORY:  Pertinent for breast cancer, hyperlipidemia. She has had  a tonsillectomy, hysterectomy, lumpectomy, appendectomy, and says that  she had a laparoscopy in 1996 and was found to have a lot of adhesions.   FAMILY HISTORY:  Positive for heart disease in her father and her  brother. Breast cancer in a paternal grandmother and a maternal aunt.  Alcoholism in a maternal grandfather. Diabetes is also in the family.   SOCIAL HISTORY:  The patient is married and has two children. She is an  Librarian, academic, non-smoker. She does drink alcohol socially.   REVIEW OF SYSTEMS:  Has been having  some recent menopausal symptoms with  night sweats and low back pain. She does have occasional PVCs. GI as  outlined above. All other review of systems are negative.   PHYSICAL EXAMINATION:  GENERAL:  Well developed white female in no acute  distress.  VITAL SIGNS:  Height 5 foot 1.5 inches, weight 156, blood pressure  112/64, pulse 64.  HEENT:  Normocephalic atraumatic, EOMI, PERRLA, sclerae anicteric.  NECK:  Supple without nodes.  CARDIOVASCULAR:  Regular rate and rhythm with S1 and S2.  PULMONARY:  Clear to auscultation and percussion.  ABDOMEN:  Soft, bowel sounds are somewhat hyperactive. She has  tenderness in the right mid quadrant and right lower quadrant. There is  no definite mass or hepatosplenomegaly. No guarding or rebound.  RECTAL:  Hemoccult negative.   IMPRESSION:  1. This is a 55 year old female with three week history of abdominal      bloating, swelling, and distension of unclear etiology. Known to      have a  complex right ovarian cystic lesion which is probably      accounting for her frequent urination and may be contributing to      her symptoms of abdominal bloating and swelling. Certainly, there      was no suggestion of any other abnormality on her recent CT scan,      rule out partial obstruction.  2. History of breast CA.  3. Status post hysterectomy, appendectomy.   PLAN:  1. Check plain abdominal films today to rule out obstruction.  2. Advised Gas-X 2 p.o. with each meal.  3. Continue Nexium.  4. Very light diet with gradual advancement over the next 48 hours. If      her plain films are negative, would treat expectantly. If she      continues to have symptoms, we will need to      consider further workup, possibly repeat CT scan or endoscopy.  5. Check amylase, lipase, and CBC today.      Mike Gip, PA-C  Electronically Signed      Rachael Fee, MD  Electronically Signed   AE/MedQ  DD: 12/23/2006  DT: 12/25/2006  Job #: [id number]   cc:   Carrington Clamp, M.D.

## 2010-10-30 NOTE — H&P (Signed)
NAMESHELANDA, [name]                           ACCOUNT NO.:  [id number]   MEDICAL RECORD NO.:  [id number]                   PATIENT TYPE:  INP   LOCATION:  NA                                   FACILITY:  [facility]   PHYSICIAN:  Carrington Clamp, M.D.              DATE OF BIRTH:  1955/08/18   DATE OF ADMISSION:  04/05/2003  DATE OF DISCHARGE:                                HISTORY & PHYSICAL   CHIEF COMPLAINT:  This is a 55 year old with symptomatic fibroids.   HISTORY OF PRESENT ILLNESS:  Ms. [name] came to me back in May 2004,  complaining of irregular periods occurring as close as two weeks and  occasionally as long as two weeks with extreme cramping and heavy flow with  clots.  The patient had not been controlled on Midol.  The patient  complained of no spotting between her periods, however, she did state that  she was having excessive flow.  One exam, the patient had a uterus size that  was about 10-12 weeks with a fullness on the right hand side.  An ultrasound  was performed which revealed a 10 x 5 x 4 cm uterine measurement with an  anterior fundal fibroid.  The patient had been on Atenolol for PVCs and then  was started on Yasmine to try and help control the bleeding.  However, in  the interim the patient returned for an exam on March 19, 2003.  At this  point and time on her pelvic exam, the uterus felt about 13-14 weeks in size  with no fullness on the right this time.  The patient has a history of  adhesions and an appendectomy and the patient was therefore consented for a  total abdominal hysterectomy with possible bilateral salingo-oophorectomy.   PAST MEDICAL HISTORY:  1. PVCs which have been decreased by Atenolol.  The patient is followed by a     cardiologist.  2. Heart murmur.  3. Requires preoperative antibiotics.  4. No diabetes, high blood pressure or coronary artery disease.   PAST SURGICAL HISTORY:  1. Appendicitis with laparoscope to remove that.  2.  Prior laparoscopy for adhesions in the 1990s.  3. Tonsils out.  4. Bone spur removed.   PAST GYNECOLOGICAL HISTORY:  Negative for sexually transmitted diseases or  PID.   PAST OBSTETRICAL HISTORY:  Term spontaneous vaginal delivery x2.   MEDICATIONS:  1. Atenolol 500 mg.  2. Zyrtec 10 mg.  3. Prevacid 15 mg.  4. Baby aspirin one a day which she suspended about a week before the     surgery.   ALLERGIES:  No known drug allergies.   SOCIAL HISTORY:  The patient does not smoker.   PHYSICAL EXAMINATION:  VITAL SIGNS:  Blood pressure 120/70.  HEENT:  Anicteric without lymphadenopathy.  HEART:  Regular rate and rhythm.  LUNGS:  Clear to auscultation.  ABDOMEN:  Soft, nontender, nondistended.  EXTREMITIES:  Benign.  SKIN:  Benign.  PELVIC:  Normal external genitalia.  Normal vagina.  Cervix was round and  mobile without lesions.  Uterus was 13- to 14-week size, bulky and no other  masses.  Bladder and anus were without lesions.   ASSESSMENT:  This is a now 55 year old with symptomatic fibroids not  controlled on birth control pills with increasing size of fibroids over the  last six months.  The patient desires definitive therapy.   PLAN:  The patient has been consented for total abdominal hysterectomy and  desires to retain her ovaries unless they look frankly abnormal in which  case she would allow me to remove them.  The patient will receive  preoperative antibiotics including ampicillin for prevention of bacterial  endocarditis.  The patient understands all risks, benefits and alternatives  to the operation and desires to proceed.                                               Carrington Clamp, M.D.    MH/MEDQ  D:  04/05/2003  T:  04/05/2003  Job:  [id number]

## 2010-10-30 NOTE — Op Note (Signed)
[name], [name]               ACCOUNT NO.:  [id number]   MEDICAL RECORD NO.:  [id number]          PATIENT TYPE:  AMB   LOCATION:  DSC                          FACILITY:  [facility]   PHYSICIAN:  Sandria Bales. Ezzard Standing, M.D.  DATE OF BIRTH:  Dec 19, 1955   DATE OF PROCEDURE:  11/30/2004  DATE OF DISCHARGE:                                 OPERATIVE REPORT   PREOPERATIVE DIAGNOSIS:  Right breast carcinoma, with positive margin from  prior lumpectomy.   POSTOPERATIVE DIAGNOSIS:  Right breast carcinoma (ductal carcinoma in situ),  with positive caudal margin.   PROCEDURE:  Right partial mastectomy, with excision of inferior margin of  prior biopsy site.   SURGEON:  Sandria Bales. Ezzard Standing, M.D.   No first assistant.   ANESTHESIA:  General LMA, with 20 cc of 0.25% Marcaine plain.   COMPLICATIONS:  None.   INDICATIONS FOR PROCEDURE:  [name] is a 55 year old white female who  had a right breast carcinoma diagnosed on core biopsy by Dr. Jeralyn Ruths.  I did a lumpectomy under needle localization on November 18, 2004.  Her final pathology revealed an 8 mm segment of high-grade DCIS.  However,  the tumor had been transected at the inferior margin.  She now comes for  wider excision of this inferior margin.   OPERATIVE NOTE:  Patient placed in a supine position, given a general LMA  anesthesia.  Her right breast was prepped with Betadine solution and  sterilely draped.  She was given 1 g of vancomycin at the initiation of the  procedure as a prophylaxis for PVCs.   I excised the scar from the right breast about 1/4 inch around the entire  scar.  She had a large hematoma of her right breast consistent with her  right breast biopsy site, but there was no evidence of infection.  I  irrigated out this hematoma.  I then excised an approximately 1 cm thickness  of the entire inferior flap.  Actually, it was kind of an ellipse.  It went  from like 3:00 to 9:00 in the inferior biopsy site.  I marked  this with  medial, cranial, and lateral metal markers, and then a long suture for the  skin.   She tolerated the procedure well.  The wound was then irrigated with saline.  I used about 2 liters of saline to irrigate out the wound.  I then placed  small clips along the inferior border, again as markers for eventual  radiation therapy.  I saw no gross cancer or tumor that I thought I  transected, but again on a microscopic level I will not know until I get her  final pathology back.   I then closed the skin with subcutaneous tissues with 3-0 Vicryl sutures and  the skin with a 5-0 Monocryl suture, painted with tincture of Benzoin and  Steri-Strips.   The patient tolerated the procedure well and was transported to the recovery  room in good condition.       DHN/MEDQ  D:  11/30/2004  T:  11/30/2004  Job:  [id number]  cc:   Sigmund Hazel, M.D.  [address] [place], [place] [address]  Fax: [phone]   Carrington Clamp, M.D.  [address]  [address]  [place], [place] [address]  Fax: [phone]   Thereasa Solo. Little, M.D.  [address]  [address]  [place]  [place] [address]  Fax: [phone]   Jeralyn Ruths, M.D.

## 2010-10-30 NOTE — Op Note (Signed)
[name], [name]                          ACCOUNT NO.:  [id number]   MEDICAL RECORD NO.:  [id number]                   PATIENT TYPE:  INP   LOCATION:  0450                                 FACILITY:  [facility]   PHYSICIAN:  Skeet Simmer., M.D.         DATE OF BIRTH:  [birth year]   DATE OF PROCEDURE:  02/06/2002  DATE OF DISCHARGE:  02/06/2002                                 OPERATIVE REPORT   PREOPERATIVE DIAGNOSIS:  Acute appendicitis.   POSTOPERATIVE DIAGNOSIS:  Abdominal pain of unknown etiology.   OPERATION:  Laparoscopy and appendectomy.   SURGEON:  Zigmund Daniel, M.D.   ANESTHESIA:  General.   PROCEDURE:  After the patient was monitored and anesthetized, and had a  Foley catheter and routine preparation and draping, the abdomen was  anesthetized at a spot just below the umbilicus, with long-acting local  anesthetic.  We made a transverse incision about 2 cm in length in the  fascia by incising it longitudinally and then entered the peritoneum  bluntly.  No adhesions were noted.  I put in a 0 Vicryl pursestring suture  and secured an Hasson cannula.  Then placed a lower midline 10/12 port under  direct vision and a 5 mm right upper quadrant port under direct vision,  after anesthetizing the areas with local anesthetic.  I noted some slightly  cloudy fluid in the pelvis, but did not see any acute inflammation in the  right lower quadrant.  I lifted the cecum and exposed the appendix, and it  did not appear to be acutely inflamed.  I examined the distal three feet of  the small intestine, and saw no evidence of the Meckel's diverticulum.  I  examined the pelvis and found a bit of fluid there and removed it.  The  tubes and ovaries looked normal.  The uterus was normal, except for a very  small fibroid along the fundus on the left side.  I looked at the  gallbladder and it was nondistended and not inflamed. The sigmoid colon was  normal in appearance.  I felt that  the patient probably did not have  appendicitis, but did not have any other acute inflammatory process of a  surgical nature in the abdomen.  Because it appeared to be quite easy, I  decided I would do an appendectomy.   I grasped the appendix with a ratchet and grasper, elevated it and then the  mesoappendix somewhat.  Then, stapled across the mesoappendix and the base  of the appendix with a cutting stapler.  I right amputated the appendix  nicely.  I then got hemostasis by cauterizing a couple of small bleeding  vessels in the mesoappendix.  I irrigated the area briefly and removed the  irrigant.  Found that there was good hemostasis.  I removed the appendix  through the lower midline incision, but pulling it up into the port.  I then  removed the lateral port under direct vision, and saw that there was no  bleeding.  I removed the umbilical port and allowed the CO2 to escape, then  carefully tied the pursestring suture.  I closed all incisions with  intracuticular 4-0 Vicryl and Steri-Strips.  She was stable throughout the  procedure.                                                Skeet Simmer., M.D.    Elvis Coil  D:  02/06/2002  T:  02/08/2002  Job:  [id number]

## 2010-10-30 NOTE — Op Note (Signed)
NAMEMERTICE, UFFELMAN                           ACCOUNT NO.:  [id number]   MEDICAL RECORD NO.:  [id number]                   PATIENT TYPE:  INP   LOCATION:  9399                                 FACILITY:  [facility]   PHYSICIAN:  Carrington Clamp, M.D.              DATE OF BIRTH:  [birth year]   DATE OF PROCEDURE:  04/05/2003  DATE OF DISCHARGE:                                 OPERATIVE REPORT   PREOPERATIVE DIAGNOSES:  1. Symptomatic fibroid uterus.  2. Menorrhagia.   POSTOPERATIVE DIAGNOSES:  1. Symptomatic fibroid uterus.  2. Menorrhagia.   PROCEDURE:  Total abdominal hysterectomy.   ATTENDING:  Carrington Clamp, M.D.   ASSISTANT:  Miguel Aschoff, M.D.   ANESTHESIA:  General endotracheal anesthesia.   ESTIMATED BLOOD LOSS:  100 mL.   FLUIDS REPLACED:  1300.   URINE OUTPUT:  150 mL.   COMPLICATIONS:  None.   FINDINGS:  A 105 g uterus with fibroids.  There were no adhesions.  There  was a very small amount of endometriosis in the right sidewall, less than 5  mm.  The ovaries were completely normal.   MEDICATIONS:  Ampicillin.   PATHOLOGY:  Uterus and cervix.   COUNTS:  Correct x3.   TECHNIQUE:  After adequate general anesthesia was achieved, the patient was  prepped and draped in the usual sterile fashion in the dorsal supine  position with Foley catheter in place.  A Pfannenstiel skin incision was  made with the scalpel and carried down to the fascia with the Bovie cautery.  The fascia was incised in the midline with a scalpel and carried in a  transverse curvilinear manner with the Mayo scissors.  The fascia was  reflected superiorly and inferiorly from the rectus muscles, rectus muscles  split in the midline.  A bowel-free portion of peritoneum was entered into  bluntly and the peritoneum incised in a superior and inferior manner with  good visualization of the bowel and bladder.   The bowel was packed away with four wet laps and the Balfour retractor  placed.  A pair  of Kellys was used to clamp each cornu of the uterus.  The  round ligaments were secured bilaterally with suture transfixion stitch of 0  Vicryl.  Each round ligament was divided with Bovie cautery, which was  extended to include the vesicouterine peritoneum and create the bladder  flap.  The avascular portion of the posterior broad ligament was entered  into with the Bovie cautery and the uterine-ovarian ligament then clamped  and divided with the Mayo scissors.  Each pedicle was secured with a free-  hand tie of 0 Vicryl, followed by a stitch of 0 Vicryl.   The uterine arteries bilaterally were skeletonized and then secured with a  Heaney clamp bilaterally at the level of the internal os.  Each pedicle was  incised with the Mayo scissors and secured with a stitch of  0 Vicryl.   Alternating successive bites of the straight Heaney clamp were used to  divide the cardinal ligament.  This was done after the bladder had been  retracted inferiorly with both sharp and blunt dissection.  Each pedicle was  incised with the scalpel and then secured with a stitch of 0 Vicryl.  At the  level of the external os and the reflection of the vagina and to the cervix,  a pair of Heaneys was placed bilaterally and each pedicle was incised with  the Jorgenson scissors.  The uterosacral ligament was then secured with a  Heaney stitch of 0 Vicryl.  The uterine specimen was then amputated with the  Jorgenson scissors.  Figure-of-eight stitches were used in each of the  corners of the vagina to secure the angles of the vagina and close a portion  of the vagina.  The rest of the cuff was closed with figure-of-eight  stitches of 0 Vicryl.  An additional stitch was placed on the right-hand  side superficially on one of the pedicles to secure some bleeding, and  hemostasis was achieved after this.  Irrigation was performed and hemostasis  had been achieved and all instruments were then withdrawn from the abdomen.    The peritoneum was then closed with a running stitch of 2-0 Vicryl.  The  fascia was closed with a running stitch of 0 PDS.  The subcutaneous tissue  was rendered hemostatic and the skin was closed with staples.  The patient  tolerated the procedure well, was returned to the recovery room in stable  condition.                                               Carrington Clamp, M.D.    MH/MEDQ  D:  04/05/2003  T:  04/05/2003  Job:  [id number]

## 2010-10-30 NOTE — Op Note (Signed)
[name], [name]               ACCOUNT NO.:  [id number]   MEDICAL RECORD NO.:  [id number]          PATIENT TYPE:  AMB   LOCATION:  DSC                          FACILITY:  [facility]   PHYSICIAN:  Sandria Bales. Ezzard Standing, M.D.  DATE OF BIRTH:  09-13-1955   DATE OF PROCEDURE:  11/18/2004  DATE OF DISCHARGE:                                 OPERATIVE REPORT   PREOPERATIVE DIAGNOSIS:  Ductal carcinoma in situ of right breast at 9 to 10  o'clock position.   POSTOPERATIVE DIAGNOSIS:  Ductal carcinoma in situ of right breast at 9 to  10 o'clock position.   PROCEDURE:  Right breast partial mastectomy (lumpectomy) with needle  localization x3.   SURGEON:  Sandria Bales. Ezzard Standing, M.D.  No first assistant.   ANESTHESIA:  General LMA with approximately 20 mL of 0.25% Marcaine.   COMPLICATIONS:  None.   INDICATION FOR PROCEDURE:  Ms. [name] is a patient of Sigmund Hazel, M.D.,  and Carrington Clamp, M.D., who had a biopsy by Dr. Jeralyn Ruths of the  right breast at the 9 to 10 o'clock position, which proved to be a ductal  carcinoma in situ.  I discussed with the patient the options for treatments,  which include lumpectomy and radiation therapy versus mastectomy, and she  wants to proceed with a lumpectomy.  Since this area is nonpalpable, Dr.  Yolanda Bonine has done a needle localization for me preoperatively and actually  she has placed three separate wires in the right breast trying to outline  these microcalcifications, and these wires radiate at it from the 9 to 10  o'clock position in the right breast.   The indications and potential complications were explained to the patient.  The potential complications include but are not limited to bleeding,  infection and local recurrence of the breast cancer.   OPERATIVE NOTE:  The patient presents to the operating room from needle  localization with three wires in the right breast.  Her right breast was  prepped with Betadine solution and sterilely draped.   A used the central  wire in my central incision.  I tried to get an ellipse of skin to mark so  this would mark the skin side of the wire localization.  I then took out a  block of breast tissue, about 4 x 3 x 3 cm, went down to the chest wall with  a complete excision.  I thought I got well around each wire.  I sent the  specimen for specimen mammography, which was done by Dr. Yolanda Bonine.   Dr. Yolanda Bonine called and said it appeared, at least by specimen mammography,  that all the microcalcifications had been removed, and then the touch prep  for pathology showed no residual evidence of malignancy.  I marked the  specimen, but again the skin was superficial.  I put a medial, cranial and  lateral tags on the specimen.   I then irrigated the wound.  I did place small Hemoclips within the wound  for marking for future radiation therapy.  Hemostasis was controlled with  Bovie electrocautery and 2-0 Vicryl sutures  and 3-0 Vicryl, then I closed  the skin with 3-0 Vicryl and a 5-0 subcuticular suture, painted with  tincture of Benzoin and steri-stripped.   The patient tolerated the procedure well, was transported to the recovery  room in good condition.  Sponge and needle count were correct at the end of  the case.       DHN/MEDQ  D:  11/18/2004  T:  11/18/2004  Job:  [id number]   cc:   Sigmund Hazel, M.D.  [address] [place], [place] [address]  Fax: [phone]   Carrington Clamp, M.D.  [address]  [address]  [place], [place] [address]  Fax: [phone]   Thereasa Solo. Little, M.D.  [address]  [address]  [place]  [place] [address]  Fax: [phone]   Jeralyn Ruths, M.D.

## 2010-10-30 NOTE — Discharge Summary (Signed)
NAMEKATIEJO, GILROY                           ACCOUNT NO.:  [id number]   MEDICAL RECORD NO.:  [id number]                   PATIENT TYPE:  INP   LOCATION:  9326                                 FACILITY:  [facility]   PHYSICIAN:  Carrington Clamp, M.D.              DATE OF BIRTH:  06/12/1956   DATE OF ADMISSION:  04/05/2003  DATE OF DISCHARGE:  04/07/2003                                 DISCHARGE SUMMARY   ADMISSION DIAGNOSIS:  Symptomatic fibroids.   DISCHARGE DIAGNOSIS:  Symptomatic fibroids.   PERTINENT PROCEDURES PERFORMED:  Total abdominal hysterectomy.   PERTINENT LABORATORY DATA:  Preoperatively H&H of 13.4 and postoperative H&H  of 10.1.   Please refer to the dictated History and Physical in the chart, but briefly  this is a 55 year old with symptomatic fibroids.   HOSPITAL COURSE:  The patient was admitted on April 05, 2003 for the above-  named procedures and underwent them without complications.  By postoperative  day #2 she was eating, ambulating, voiding without problems, and was  discharged with the following:   MEDICATIONS:  1. Percocet 5 mg one p.o. q.4-6 h. p.r.n. pain.  2. Motrin 800 mg one p.o. q.8 h. p.r.n. pain.   ACTIVITY:  Pelvic rest x6 weeks.  No heavy lifting or straining for six  weeks.  No driving x2 weeks.   DIET:  Regular, high fiber, high water.   WOUND CARE:  Air traffic controller.   FOLLOW UP:  Follow up the following Monday for staples to be removed.                                               Carrington Clamp, M.D.    MH/MEDQ  D:  05/02/2003  T:  05/03/2003  Job:  [id number]

## 2011-03-29 LAB — URINALYSIS, ROUTINE W REFLEX MICROSCOPIC
Bilirubin Urine: NEGATIVE
Glucose, UA: NEGATIVE
Hgb urine dipstick: NEGATIVE
Ketones, ur: NEGATIVE
Nitrite: NEGATIVE
Protein, ur: NEGATIVE
Specific Gravity, Urine: 1.01
Urobilinogen, UA: 0.2
pH: 7

## 2011-03-29 LAB — CBC
HCT: 39.4
Hemoglobin: 13.7
MCHC: 34.8
MCV: 94.9
Platelets: 291
RBC: 4.15
RDW: 13.2
WBC: 8.2

## 2011-06-11 ENCOUNTER — Emergency Department (HOSPITAL_COMMUNITY)
Admission: EM | Admit: 2011-06-11 | Discharge: 2011-06-11 | Disposition: A | Discharge status: Home / Self Care | Payer: BC Managed Care – PPO | Attending: Emergency Medicine | Admitting: Emergency Medicine

## 2011-06-11 ENCOUNTER — Emergency Department (HOSPITAL_COMMUNITY): Payer: BC Managed Care – PPO

## 2011-06-11 ENCOUNTER — Encounter: Payer: Self-pay | Admitting: Emergency Medicine

## 2011-06-11 DIAGNOSIS — Y998 Other external cause status: Secondary | ICD-10-CM | POA: Insufficient documentation

## 2011-06-11 DIAGNOSIS — Y92009 Unspecified place in unspecified non-institutional (private) residence as the place of occurrence of the external cause: Secondary | ICD-10-CM | POA: Insufficient documentation

## 2011-06-11 DIAGNOSIS — S42293A Other displaced fracture of upper end of unspecified humerus, initial encounter for closed fracture: Secondary | ICD-10-CM | POA: Insufficient documentation

## 2011-06-11 DIAGNOSIS — Z853 Personal history of malignant neoplasm of breast: Secondary | ICD-10-CM | POA: Insufficient documentation

## 2011-06-11 DIAGNOSIS — S42301A Unspecified fracture of shaft of humerus, right arm, initial encounter for closed fracture: Secondary | ICD-10-CM

## 2011-06-11 DIAGNOSIS — I4949 Other premature depolarization: Secondary | ICD-10-CM | POA: Insufficient documentation

## 2011-06-11 DIAGNOSIS — W010XXA Fall on same level from slipping, tripping and stumbling without subsequent striking against object, initial encounter: Secondary | ICD-10-CM | POA: Insufficient documentation

## 2011-06-11 DIAGNOSIS — Z7982 Long term (current) use of aspirin: Secondary | ICD-10-CM | POA: Insufficient documentation

## 2011-06-11 DIAGNOSIS — Z79899 Other long term (current) drug therapy: Secondary | ICD-10-CM | POA: Insufficient documentation

## 2011-06-11 HISTORY — DX: Ventricular premature depolarization: I49.3

## 2011-06-11 HISTORY — DX: Other disorders of electrolyte and fluid balance, not elsewhere classified: E87.8

## 2011-06-11 HISTORY — DX: Malignant neoplasm of unspecified site of unspecified female breast: C50.919

## 2011-06-11 MED ORDER — BACITRACIN-NEOMYCIN-POLYMYXIN 400-5-5000 EX OINT
TOPICAL_OINTMENT | CUTANEOUS | Status: AC
  Administered 2011-06-11: 1
  Filled 2011-06-11: qty 1

## 2011-06-11 MED ORDER — MORPHINE SULFATE 4 MG/ML IJ SOLN
4.0000 mg | Freq: Once | INTRAMUSCULAR | Status: AC
  Administered 2011-06-11: 4 mg via INTRAVENOUS
  Filled 2011-06-11: qty 1

## 2011-06-11 MED ORDER — ACETAMINOPHEN-CODEINE #3 300-30 MG PO TABS
2.0000 | ORAL_TABLET | Freq: Once | ORAL | Status: DC
  Filled 2011-06-11: qty 2

## 2011-06-11 MED ORDER — ACETAMINOPHEN-CODEINE #3 300-30 MG PO TABS: 2.0000 | ORAL_TABLET | ORAL | Status: AC | PRN

## 2011-06-11 MED ORDER — ACETAMINOPHEN-CODEINE #3 300-30 MG PO TABS
2.0000 | ORAL_TABLET | Freq: Once | ORAL | Status: AC
  Administered 2011-06-11: 2 via ORAL

## 2011-06-11 NOTE — ED Notes (Addendum)
Pt tripped over the cat this am and has pain to rt shoulder area and "heard a crunch" pain and swelling to rt upper arm

## 2011-06-11 NOTE — ED Notes (Signed)
Pt tripped over her cat, fell forward, hit hardwood floor. No LOC. Pain at right arm. Right arm is supported with sling. Painful at touch and movement. Circulation intact. Pulse present. Able to move fingers. Unable to move her shoulder and elbow.

## 2011-06-11 NOTE — ED Provider Notes (Signed)
History     CSN: [id number]  Arrival date & time 06/11/11  2130   First MD Initiated Contact with Patient 06/11/11 [phone]      Chief Complaint  Patient presents with  . Fall    (Consider location/radiation/quality/duration/timing/severity/associated sxs/prior treatment) HPI The patient is a 55 yo F who had a mechanical fall when she tripped over her cat.  Patient landed on outstretched right arm and has had R shoulder pain ever since.  She has history of right shoulder arthroscopy with Dr. Everlean Alstrom of [facility] in 2008 for "cleanout of bone spurs".  Patient has 4/10 pain while still and 10/10 with movement.  She has no other injuries or concerns.  Past Medical History  Diagnosis Date  . PVC (premature ventricular contraction)   . Hyperchloremia   . Breast ca     Past Surgical History  Procedure Date  . Mastectomy     History reviewed. No pertinent family history.  History  Substance Use Topics  . Smoking status: Not on file  . Smokeless tobacco: Not on file  . Alcohol Use: No    OB History    Grav Para Term Preterm Abortions TAB SAB Ect Mult Living                  Review of Systems  Constitutional: Negative.   HENT: Negative.   Eyes: Negative.   Respiratory: Negative.   Cardiovascular: Negative.   Gastrointestinal: Negative.   Genitourinary: Negative.   Musculoskeletal:       Right shoulder pain  Skin: Negative.   Neurological: Negative.   Hematological: Negative.   Psychiatric/Behavioral: Negative.   All other systems reviewed and are negative.    Allergies  Amoxicillin and Erythromycin  Home Medications   Current Outpatient Rx  Name Route Sig Dispense Refill  . ASCORBIC ACID 250 MG PO CHEW Oral Chew 250 mg by mouth 2 (two) times daily.      . ASPIRIN EC 81 MG PO TBEC Oral Take 81 mg by mouth daily.      . ATENOLOL 25 MG PO TABS Oral Take 25 mg by mouth daily.      Marland Kitchen BLACK COHOSH HOT FLASH RELIEF PO Oral Take 1 tablet by mouth 2 (two) times daily.       Marland Kitchen CETIRIZINE HCL 10 MG PO TABS Oral Take 10 mg by mouth daily.      Marland Kitchen ESOMEPRAZOLE MAGNESIUM 40 MG PO CPDR Oral Take 40 mg by mouth daily before breakfast.      . EZETIMIBE 10 MG PO TABS Oral Take 10 mg by mouth daily.      . OMEGA-3 FATTY ACIDS 1000 MG PO CAPS Oral Take 1 g by mouth 2 (two) times daily.      Marland Kitchen GLUCOSAMINE-CHONDROITIN 500-400 MG PO TABS Oral Take 1 tablet by mouth 2 (two) times daily.      . IBUPROFEN 200 MG PO TABS Oral Take 200 mg by mouth every 6 (six) hours as needed. For headache     . ADULT MULTIVITAMIN W/MINERALS CH Oral Take 1 tablet by mouth daily.      Marland Kitchen PAROXETINE HCL 12.5 MG PO TB24 Oral Take 12.5 mg by mouth every morning.      Marland Kitchen PRAVASTATIN SODIUM 40 MG PO TABS Oral Take 40 mg by mouth daily.      . ACETAMINOPHEN-CODEINE #3 300-30 MG PO TABS Oral Take 2 tablets by mouth every 4 (four) hours as needed for pain. 30 tablet  1  . MOMETASONE FUROATE 50 MCG/ACT NA SUSP Nasal Place 1 spray into the nose 2 (two) times daily.        BP 134/109  Pulse 62  Temp(Src) 97.4 F (36.3 C) (Oral)  Resp 16  SpO2 97%  Physical Exam  Nursing note and vitals reviewed. Constitutional: She is oriented to person, place, and time. She appears well-developed and well-nourished. No distress.  HENT:  Head: Normocephalic and atraumatic.  Eyes: Conjunctivae and EOM are normal. Pupils are equal, round, and reactive to light.  Neck: Normal range of motion.  Cardiovascular: Normal rate.   Pulmonary/Chest: Effort normal.  Musculoskeletal: She exhibits tenderness.       TTP over the right shoulder with no obvious deformity.NVI  Neurological: She is alert and oriented to person, place, and time. No cranial nerve deficit.  Skin: Skin is warm and dry. No rash noted.  Psychiatric: She has a normal mood and affect.    ED Course  Procedures (including critical care time)  Labs Reviewed - No data to display Dg Shoulder Right  06/11/2011  *RADIOLOGY REPORT*  Clinical Data: Post fall,  now with right shoulder pain  RIGHT SHOULDER - 2+ VIEW  Comparison: None.  Findings:  There is a comminuted, minimally displaced fracture of the metaphysis of the humerus extending through the humeral head to glenohumeral articular surface. There is also a minimally displaced fracture of the greater tuberosity of the humerus.  No dislocation.aa Limited visualization of the adjacent thorax is normal.  IMPRESSION: 1.  Comminuted minimally displaced fracture of the superior aspect of the humerus extending to the glenohumeral joint. 2.  Minimally displaced fracture of the greater tuberosity.  Original Report Authenticated By: Waynard Reeds, M.D.   Dg Humerus Right  06/11/2011  *RADIOLOGY REPORT*  Clinical Data: Post fall, now with right shoulder pain  RIGHT HUMERUS - 2+ VIEW  Comparison: Right shoulder radiographs - earlier same day  Findings: There is a comminuted, minimally displaced fracture of the metaphysis of the humerus extending through the humeral head to glenohumeral articular surface. There is also a minimally displaced fracture of the greater tuberosity of the humerus.  No other fractures are identified within the humerus.  IMPRESSION: 1.  Comminuted minimally displaced fracture of the superior aspect of the humerus extending to the glenohumeral joint. 2.  Minimally displaced fracture of the greater tuberosity.  Original Report Authenticated By: Waynard Reeds, M.D.     1. Closed fracture of right humerus       MDM  Patient was evaluated and was given tylenol #3 for pain.  She had plain films with proximal humeral fracture invading the glenohumeral joint with minimal displacement.  Patient preferred to follow-up with [facility] where she has had work done previously.  I spoke with Dr. Scarlette Calico who was on call and recommended sling with immobilizer and follow-up on Monday.  Patient was placed in sling and discharged with pain medication in good condition.          Cyndra Numbers, MD 06/11/11  2124

## 2011-06-11 NOTE — ED Notes (Signed)
Ortho tech applied sling.  

## 2011-06-11 NOTE — ED Notes (Signed)
Ortho tech called 

## 2011-06-11 NOTE — ED Notes (Signed)
Wound care done on abrasio on right elbow, neosporin applied,

## 2011-06-11 NOTE — ED Notes (Signed)
Pt informed of plan of care. Informed a orthopedicsurgeon will be seeing her. Reports better in pai

## 2011-10-27 ENCOUNTER — Other Ambulatory Visit: Payer: Self-pay | Admitting: Radiology

## 2011-11-01 ENCOUNTER — Other Ambulatory Visit: Payer: Self-pay | Admitting: Family Medicine

## 2011-11-01 DIAGNOSIS — R220 Localized swelling, mass and lump, head: Secondary | ICD-10-CM

## 2011-11-09 ENCOUNTER — Ambulatory Visit
Admission: RE | Admit: 2011-11-09 | Discharge: 2011-11-09 | Disposition: A | Discharge status: Home / Self Care | Payer: BC Managed Care – PPO | Source: Ambulatory Visit | Attending: Family Medicine | Admitting: Family Medicine

## 2011-11-09 DIAGNOSIS — R220 Localized swelling, mass and lump, head: Secondary | ICD-10-CM

## 2011-11-09 DIAGNOSIS — R221 Localized swelling, mass and lump, neck: Secondary | ICD-10-CM

## 2011-11-09 MED ORDER — IOHEXOL 300 MG/ML  SOLN
75.0000 mL | Freq: Once | INTRAMUSCULAR | Status: AC | PRN
  Administered 2011-11-09: 75 mL via INTRAVENOUS

## 2011-12-13 DIAGNOSIS — Z9289 Personal history of other medical treatment: Secondary | ICD-10-CM

## 2011-12-13 HISTORY — DX: Personal history of other medical treatment: Z92.89

## 2012-09-06 ENCOUNTER — Other Ambulatory Visit: Payer: Self-pay | Admitting: Internal Medicine

## 2012-09-06 ENCOUNTER — Ambulatory Visit
Admission: RE | Admit: 2012-09-06 | Discharge: 2012-09-06 | Disposition: A | Discharge status: Home / Self Care | Payer: BC Managed Care – PPO | Source: Ambulatory Visit | Attending: Internal Medicine | Admitting: Internal Medicine

## 2012-09-06 DIAGNOSIS — R1011 Right upper quadrant pain: Secondary | ICD-10-CM

## 2012-09-13 ENCOUNTER — Ambulatory Visit
Admission: RE | Admit: 2012-09-13 | Discharge: 2012-09-13 | Disposition: A | Discharge status: Home / Self Care | Payer: BC Managed Care – PPO | Source: Ambulatory Visit | Attending: Family Medicine | Admitting: Family Medicine

## 2012-09-13 ENCOUNTER — Other Ambulatory Visit (HOSPITAL_COMMUNITY): Payer: Self-pay | Admitting: Family Medicine

## 2012-09-13 ENCOUNTER — Other Ambulatory Visit: Payer: Self-pay | Admitting: Family Medicine

## 2012-09-13 DIAGNOSIS — R109 Unspecified abdominal pain: Secondary | ICD-10-CM

## 2012-09-13 MED ORDER — IOHEXOL 300 MG/ML  SOLN
100.0000 mL | Freq: Once | INTRAMUSCULAR | Status: AC | PRN
  Administered 2012-09-13: 100 mL via INTRAVENOUS

## 2012-10-02 ENCOUNTER — Encounter (HOSPITAL_COMMUNITY)
Admission: RE | Admit: 2012-10-02 | Discharge: 2012-10-02 | Disposition: A | Discharge status: Home / Self Care | Payer: BC Managed Care – PPO | Source: Ambulatory Visit | Attending: Family Medicine | Admitting: Family Medicine

## 2012-10-02 DIAGNOSIS — R1011 Right upper quadrant pain: Secondary | ICD-10-CM | POA: Insufficient documentation

## 2012-10-02 DIAGNOSIS — R109 Unspecified abdominal pain: Secondary | ICD-10-CM

## 2012-10-02 MED ORDER — TECHNETIUM TC 99M MEBROFENIN IV KIT
5.3000 | PACK | Freq: Once | INTRAVENOUS | Status: AC | PRN
  Administered 2012-10-02: 5.3 via INTRAVENOUS

## 2012-10-06 ENCOUNTER — Encounter: Payer: Self-pay | Admitting: Internal Medicine

## 2012-10-06 ENCOUNTER — Ambulatory Visit (INDEPENDENT_AMBULATORY_CARE_PROVIDER_SITE_OTHER): Payer: BC Managed Care – PPO | Admitting: Internal Medicine

## 2012-10-06 VITALS — BP 118/70 | HR 52 | Ht 61.42 in | Wt 167.5 lb

## 2012-10-06 DIAGNOSIS — R1013 Epigastric pain: Secondary | ICD-10-CM

## 2012-10-06 DIAGNOSIS — R933 Abnormal findings on diagnostic imaging of other parts of digestive tract: Secondary | ICD-10-CM

## 2012-10-06 DIAGNOSIS — K5900 Constipation, unspecified: Secondary | ICD-10-CM

## 2012-10-06 DIAGNOSIS — R141 Gas pain: Secondary | ICD-10-CM

## 2012-10-06 DIAGNOSIS — K219 Gastro-esophageal reflux disease without esophagitis: Secondary | ICD-10-CM

## 2012-10-06 DIAGNOSIS — R142 Eructation: Secondary | ICD-10-CM

## 2012-10-06 MED ORDER — MOVIPREP 100 G PO SOLR: 1.0000 | Freq: Once | ORAL | Status: DC

## 2012-10-06 NOTE — Progress Notes (Signed)
HISTORY OF PRESENT ILLNESS:  [name] is a 57 y.o. female with hyperlipidemia, obesity, hypertension, rest cancer, GERD, and IBS. I saw the patient in June of 2011 regarding problems with abdominal bloating, abdominal discomfort and nausea. She was felt to have possible post infectious IBS and GERD. Screening for celiac disease was negative. Upper endoscopy earlier that month to evaluate burning and abdominal fullness was essentially normal. She presents now, upon referral from Dr. Hyacinth Meeker, regarding recent problems with abdominal pain. The patient describes developing severe epigastric pain with radiation under the rib cage bilaterally, about one month ago. Preceding this, she describes nonspecific issues with fatigue. The abdominal discomfort lasted about 20 minutes. Thereafter, malaise, nausea, and headache. Recurrent problems with pain later that evening lasting about 30 minutes. She contacted her primary provider who made a cardiology referral based on complaints of chest pain. Evaluation was negative. Subsequent abdominal ultrasound was normal except for fatty liver. HIDA scan was negative. CT scan of the abdomen and pelvis was negative except for question of abnormal thickening of the right colon. The patient's last colonoscopy, with Dr. Juanda Chance, and 2006 revealed severe diverticulosis. Patient does note that red meats and greasy items tend to make symptoms worse. She has noticed less discomfort in recent weeks with dietary discretion. As well, her PPI was increased to twice a day. She denies any reflux symptoms at present. The patient reports to me that she has had significant problems with constipation over the past few months. This is new. She denies bleeding or weight loss. Actually, has had weight gain. She and her husband are engaging and exercise program in an attempt to lose weight. Probably other complaint is that of bloating. This is chronic  REVIEW OF SYSTEMS:  All non-GI ROS negative  except for neck pain, fatigue, sinus and allergy, leg swelling  Past Medical History  Diagnosis Date  . PVC (premature ventricular contraction)   . Hyperchloremia   . Breast CA     right  . GERD (gastroesophageal reflux disease)   . Diverticulosis   . Gastroparesis     mild    Past Surgical History  Procedure Laterality Date  . Mastectomy  2006    right lumpectomy    Social History [name]  reports that she has never smoked. She does not have any smokeless tobacco history on file. She reports that  drinks alcohol. She reports that she does not use illicit drugs.  family history is not on file.  Allergies  Allergen Reactions  . Amoxicillin     REACTION: rash  . Erythromycin     REACTION: Nausea       PHYSICAL EXAMINATION: Vital signs: BP 118/70  Pulse 52  Ht 5' 1.42" (1.56 m)  Wt 167 lb 8 oz (75.978 kg)  BMI 31.22 kg/m2 General: Well-developed, well-nourished, no acute distress HEENT: Sclerae are anicteric, conjunctiva pink. Oral mucosa intact Lungs: Clear Heart: Regular Abdomen: soft, obese, right epigastric tenderness to palpation, nondistended, no obvious ascites, no peritoneal signs, normal bowel sounds. No organomegaly. Extremities: No edema Psychiatric: alert and oriented x3. Cooperative   ASSESSMENT:  #1. Recent problems with abdominal discomfort as described. Possibly related to constipation #2. New onset constipation. #3. Abnormal CT scan with questions of pathologic thickening of the ascending colon #4. IBS, history of. Associated bloating #5. GERD. On PPI twice a day #6. Pandiverticulosis. Last colonoscopy January 2006   PLAN:  #1. Upper endoscopy to evaluate upper abdominal pain.The nature of the procedure, as well  as the risks, benefits, and alternatives were carefully and thoroughly reviewed with the patient. Ample time for discussion and questions allowed. The patient understood, was satisfied, and agreed to proceed. #2. Colonoscopy  to evaluate abnormal CT imaging and change in bowel habits associated with pain.The nature of the procedure, as well as the risks, benefits, and alternatives were carefully and thoroughly reviewed with the patient. Ample time for discussion and questions allowed. The patient understood, was satisfied, and agreed to proceed. #3. Movi prep prescribed. Patient instructed on its use #4. If the above unrevealing, then bowel regimen such as MiraLax

## 2012-10-06 NOTE — Patient Instructions (Addendum)

## 2012-10-09 ENCOUNTER — Ambulatory Visit (AMBULATORY_SURGERY_CENTER): Payer: BC Managed Care – PPO | Admitting: Internal Medicine

## 2012-10-09 ENCOUNTER — Encounter: Payer: Self-pay | Admitting: Internal Medicine

## 2012-10-09 VITALS — BP 122/71 | HR 52 | Temp 97.4°F | Resp 16 | Ht 61.0 in | Wt 167.0 lb

## 2012-10-09 DIAGNOSIS — R1013 Epigastric pain: Secondary | ICD-10-CM

## 2012-10-09 DIAGNOSIS — R933 Abnormal findings on diagnostic imaging of other parts of digestive tract: Secondary | ICD-10-CM

## 2012-10-09 DIAGNOSIS — K5730 Diverticulosis of large intestine without perforation or abscess without bleeding: Secondary | ICD-10-CM

## 2012-10-09 DIAGNOSIS — K219 Gastro-esophageal reflux disease without esophagitis: Secondary | ICD-10-CM

## 2012-10-09 MED ORDER — SODIUM CHLORIDE 0.9 % IV SOLN: 500.0000 mL | INTRAVENOUS | Status: DC

## 2012-10-09 NOTE — Op Note (Signed)
[facility] [address] [place], [address]   ENDOSCOPY PROCEDURE REPORT  PATIENT: Mysha, Peeler  MR#: [id number] BIRTHDATE: 08-20-1955 , 56  yrs. old GENDER: Female ENDOSCOPIST: Roxy Cedar, MD REFERRED BY:  .  Self / Office PROCEDURE DATE:  10/09/2012 PROCEDURE:  EGD, diagnostic ASA CLASS:     Class II INDICATIONS:  abdominal pain. MEDICATIONS: MAC sedation, administered by CRNA and propofol (Diprivan) 200mg  IV TOPICAL ANESTHETIC: Cetacaine Spray  DESCRIPTION OF PROCEDURE: After the risks benefits and alternatives of the procedure were thoroughly explained, informed consent was obtained.  The LB GIF-H180 [id number] endoscope was introduced through the mouth and advanced to the second portion of the duodenum. Without limitations.  The instrument was slowly withdrawn as the mucosa was fully examined.      The upper, middle and distal third of the esophagus were carefully inspected and no abnormalities were noted.  The z-line was well seen at the GEJ.  The endoscope was pushed into the fundus which was normal including a retroflexed view.  The antrum, gastric body, first and second part of the duodenum were unremarkable, save small incidental fundic gland polyps.  Retroflexed views revealed no abnormalities.     The scope was then withdrawn from the patient and the procedure completed.  COMPLICATIONS: There were no complications.  ENDOSCOPIC IMPRESSION: 1. Normal EGD  RECOMMENDATIONS: 1. Continue current meds for GERD  REPEAT EXAM:  eSigned:  Roxy Cedar, MD 10/09/2012 3:23 PM   CC:The Patient and Sigmund Hazel, MD

## 2012-10-09 NOTE — Progress Notes (Signed)
Patient did not experience any of the following events: a burn prior to discharge; a fall within the facility; wrong site/side/patient/procedure/implant event; or a hospital transfer or hospital admission upon discharge from the facility. (G8907) Patient did not have preoperative order for IV antibiotic SSI prophylaxis. (G8918)  

## 2012-10-09 NOTE — Patient Instructions (Addendum)

## 2012-10-09 NOTE — Op Note (Signed)
[facility] [address] [place], [address]   COLONOSCOPY PROCEDURE REPORT  PATIENT: [name], [name]  MR#: [id number] BIRTHDATE: September 05, 1955 , 56  yrs. old GENDER: Female ENDOSCOPIST: Roxy Cedar, MD REFERRED BY:.  Self / Office PROCEDURE DATE:  10/09/2012 PROCEDURE:   Colonoscopy, diagnostic ASA CLASS:   Class II INDICATIONS:an abnormal CT, Abdominal pain, Change in bowel habits, and Constipation. MEDICATIONS: MAC sedation, administered by CRNA and propofol (Diprivan) 300mg  IV  DESCRIPTION OF PROCEDURE:   After the risks benefits and alternatives of the procedure were thoroughly explained, informed consent was obtained.  A digital rectal exam revealed no abnormalities of the rectum.   The LB CF-H180AL [id number]  endoscope was introduced through the anus and advanced to the cecum, which was identified by both the appendix and ileocecal valve. No adverse events experienced.   The quality of the prep was excellent, using MoviPrep  The instrument was then slowly withdrawn as the colon was fully examined.      COLON FINDINGS: The mucosa appeared normal in the terminal ileum. Moderate diverticulosis was noted The finding was in the left colon.   The colon mucosa was otherwise normal.  Retroflexed views revealed internal hemorrhoids. The time to cecum=1 minutes 11 seconds.  Withdrawal time=7 minutes 59 seconds.  The scope was withdrawn and the procedure completed. COMPLICATIONS: There were no complications.  ENDOSCOPIC IMPRESSION: 1.   Normal mucosa in the terminal ileum 2.   Moderate diverticulosis was noted in the left colon 3.   The colon mucosa was otherwise normal  RECOMMENDATIONS: 1.  Continue current colorectal screening recommendations for "routine risk" patients with a repeat colonoscopy in 10 years. 2.  Upper endoscopy today (SEE REPORT) 3. Miralax 1-2 scops daily to regulate bowels   eSigned:  Roxy Cedar, MD 10/09/2012 3:20 PM   cc:  The Patient and Sigmund Hazel, MD   PATIENT NAME:  [name], [name] MR#: [id number]

## 2012-10-10 ENCOUNTER — Telehealth: Payer: Self-pay | Admitting: *Deleted

## 2012-10-10 NOTE — Telephone Encounter (Signed)
  Follow up Call-  Call back number 10/09/2012  Post procedure Call Back phone  # [phone]  Permission to leave phone message Yes     Patient questions:  Do you have a fever, pain , or abdominal swelling? no Pain Score  0 *  Have you tolerated food without any problems? yes  Have you been able to return to your normal activities? yes  Do you have any questions about your discharge instructions: Diet   no Medications  no Follow up visit  no  Do you have questions or concerns about your Care? no  Actions: * If pain score is 4 or above: No action needed, pain <4.  Pt had some gas that woke her up in the middle of the night but is not having any pain right, pt states she just feels bloated, advised to drink some warm liquids and walk around this morning to get rid of any residual air.

## 2012-10-25 ENCOUNTER — Telehealth: Payer: Self-pay | Admitting: Internal Medicine

## 2012-10-25 NOTE — Telephone Encounter (Signed)
Need refill on her Provastatin #30-call to [facility] [phone]!Not sure the phone number is correct!

## 2012-10-26 ENCOUNTER — Other Ambulatory Visit: Payer: Self-pay | Admitting: *Deleted

## 2012-10-26 MED ORDER — PRAVASTATIN SODIUM 40 MG PO TABS: 40.0000 mg | ORAL_TABLET | Freq: Every day | ORAL | Status: DC

## 2012-10-26 NOTE — Telephone Encounter (Signed)
rx sent to pharmacy by e-script  

## 2012-11-13 ENCOUNTER — Other Ambulatory Visit: Payer: Self-pay | Admitting: *Deleted

## 2012-11-13 MED ORDER — ATENOLOL 25 MG PO TABS: 25.0000 mg | ORAL_TABLET | Freq: Two times a day (BID) | ORAL | Status: DC

## 2013-03-12 ENCOUNTER — Encounter: Payer: Self-pay | Admitting: *Deleted

## 2013-03-14 ENCOUNTER — Other Ambulatory Visit: Payer: Self-pay | Admitting: Internal Medicine

## 2013-03-14 LAB — LIPID PANEL
Cholesterol: 185 mg/dL (ref 0–200)
HDL: 36 mg/dL — ABNORMAL LOW (ref >39)
LDL Cholesterol: 92 mg/dL (ref 0–99)
Total CHOL/HDL Ratio: 5.1 Ratio
Triglycerides: 286 mg/dL — ABNORMAL HIGH (ref <150)
VLDL: 57 mg/dL — ABNORMAL HIGH (ref 0–40)

## 2013-03-14 LAB — COMPLETE METABOLIC PANEL WITH GFR
ALT: 33 U/L (ref 0–35)
AST: 26 U/L (ref 0–37)
Albumin: 4.5 g/dL (ref 3.5–5.2)
Alkaline Phosphatase: 83 U/L (ref 39–117)
BUN: 19 mg/dL (ref 6–23)
CO2: 28 mEq/L (ref 19–32)
Calcium: 10 mg/dL (ref 8.4–10.5)
Chloride: 101 mEq/L (ref 96–112)
Creat: 1.12 mg/dL — ABNORMAL HIGH (ref 0.50–1.10)
GFR, Est African American: 62 mL/min
GFR, Est Non African American: 54 mL/min — ABNORMAL LOW
Glucose, Bld: 98 mg/dL (ref 70–99)
Potassium: 4 mEq/L (ref 3.5–5.3)
Sodium: 136 mEq/L (ref 135–145)
Total Bilirubin: 0.5 mg/dL (ref 0.3–1.2)
Total Protein: 6.7 g/dL (ref 6.0–8.3)

## 2013-03-19 ENCOUNTER — Encounter: Payer: Self-pay | Admitting: Internal Medicine

## 2013-03-19 ENCOUNTER — Ambulatory Visit (INDEPENDENT_AMBULATORY_CARE_PROVIDER_SITE_OTHER): Payer: BC Managed Care – PPO | Admitting: Internal Medicine

## 2013-03-19 VITALS — BP 108/78 | HR 56 | Ht 62.0 in | Wt 174.3 lb

## 2013-03-19 DIAGNOSIS — R002 Palpitations: Secondary | ICD-10-CM

## 2013-03-19 DIAGNOSIS — I493 Ventricular premature depolarization: Secondary | ICD-10-CM

## 2013-03-19 DIAGNOSIS — I4949 Other premature depolarization: Secondary | ICD-10-CM

## 2013-03-19 DIAGNOSIS — E785 Hyperlipidemia, unspecified: Secondary | ICD-10-CM

## 2013-03-19 MED ORDER — FENOFIBRATE 145 MG PO TABS: 145.0000 mg | ORAL_TABLET | Freq: Every day | ORAL | Status: DC

## 2013-03-19 NOTE — Progress Notes (Signed)
OFFICE NOTE  Chief Complaint:  Routine follow-up  Primary Care Physician: Neldon Labella, MD  HPI:  [name] is a 57 year old female previously followed by Dr. [name] with a history of lower extremity edema recently and chest discomfort in the spring which she underwent a stress test for that was negative for ischemia with a preserved EF of 71%. She has had no further episodes of chest pain, and her edema has all but resolved, however, she stayed on the hydrochlorothiazide 25 mg daily. She also has hypertension which has been well controlled and a history of breast cancer but is now cancer free. Recently she had a fall and fractured her right humerus and has not been as active from that but has since started to get back to some more activity. 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 She recently had laboratory work which shows a well controlled lipid profile, total cholesterol 185, triglycerides 286, HDL 36 and LDL 92. This is on fenofibrate. She has had a history of elevated triglycerides in the past, but is nondiabetic.  PMHx:  Past Medical History  Diagnosis Date  . PVC (premature ventricular contraction)   . Hyperchloremia   . Breast CA     right - radiation & surgery  . GERD (gastroesophageal reflux disease)   . Diverticulosis   . Gastroparesis     mild  . Hypertension   . Hyperlipidemia   . History of nuclear stress test 12/2011    bruce protocol; no evidence of ischemia, EF 79%, normal pattern of perfusion  . Family history of heart disease   . MVP (mitral valve prolapse)     last echo 07/1997 - normal LV size/function, mild MVP, mild MR    Past Surgical History  Procedure Laterality Date  . Mastectomy Right 2006    right lumpectomy  . Tonsillectomy  1969  . Appendectomy  01/2002  . Abdominal hysterectomy  03/2003  . Cardiac catheterization  10/13/2009    no significant fixed obstructive CAD, normral ramus  intermediate/circumflex/RCA; normal LV function (Dr. Bishop Limbo)    FAMHx:  Family History  Problem Relation Age of Onset  . Alzheimer's disease Mother   . Heart failure Father   . Hypertension Father     also murmur, hyperlipidemia, MI, CABG age 37  . Diabetes Brother   . Heart disease Brother 45    MI at 29  . Hypertension Brother   . Hyperlipidemia Sister   . Breast cancer Maternal Aunt     PVCs  . Breast cancer Paternal Grandmother   . Diabetes Maternal Grandmother   . Heart failure Maternal Grandmother   . Emphysema Maternal Grandfather   . Heart disease Paternal Grandfather   . Asthma Child     x2    SOCHx:   reports that she has never smoked. She has never used smokeless tobacco. She reports that  drinks alcohol. She reports that she does not use illicit drugs.  ALLERGIES:  Allergies  Allergen Reactions  . Amoxicillin     REACTION: rash  . Chocolate   . Crestor [Rosuvastatin]     myalgias  . Erythromycin     REACTION: Nausea    ROS: A comprehensive review of systems was negative.  HOME MEDS: Current Outpatient Prescriptions  Medication Sig Dispense Refill  . aspirin EC 81 MG tablet Take 81 mg by mouth daily.        Marland Kitchen atenolol (TENORMIN) 25  MG tablet Take 1 tablet (25 mg total) by mouth 2 (two) times daily after a meal.  60 tablet  6  . Calcium Carbonate-Vitamin D (CALCIUM + D PO) Take by mouth daily.      . cetirizine (ZYRTEC) 10 MG tablet Take 10 mg by mouth daily.        Marland Kitchen esomeprazole (NEXIUM) 40 MG capsule Take 40 mg by mouth daily.       . fish oil-omega-3 fatty acids 1000 MG capsule Take 1 g by mouth 2 (two) times daily.        Marland Kitchen glucosamine-chondroitin (COSAMIN DS) 500-400 MG tablet Take 1 tablet by mouth 2 (two) times daily.        . hydrochlorothiazide (HYDRODIURIL) 25 MG tablet Take 25 mg by mouth daily.      . mometasone (NASONEX) 50 MCG/ACT nasal spray Place 1 spray into the nose 2 (two) times daily.        . Multiple Vitamin (MULITIVITAMIN  WITH MINERALS) TABS Take 1 tablet by mouth daily.        . nitroGLYCERIN (NITROSTAT) 0.4 MG SL tablet Place 0.4 mg under the tongue every 5 (five) minutes as needed for chest pain.      Marland Kitchen PARoxetine (PAXIL-CR) 12.5 MG 24 hr tablet Take 12.5 mg by mouth every morning.        . pravastatin (PRAVACHOL) 40 MG tablet Take 1 tablet (40 mg total) by mouth daily.  30 tablet  6  . zolpidem (AMBIEN) 10 MG tablet Take 10 mg by mouth at bedtime as needed for sleep.      . fenofibrate (TRICOR) 145 MG tablet Take 1 tablet (145 mg total) by mouth daily.  30 tablet  6   No current facility-administered medications for this visit.    LABS/IMAGING: No results found for this or any previous visit (from the past 48 hour(s)). No results found.  VITALS: BP 108/78  Pulse 56  Ht 5\' 2"  (1.575 m)  Wt 174 lb 4.8 oz (79.062 kg)  BMI 31.87 kg/m2  EXAM: General appearance: alert and no distress Lungs: clear to auscultation bilaterally Heart: regular rate and rhythm, S1, S2 normal, no murmur, click, rub or gallop Extremities: extremities normal, atraumatic, no cyanosis or edema Pulses: 2+ and symmetric  EKG: Sinus bradycardia 56  ASSESSMENT: 1. PVCs-controlled on beta blocker 2. Hyperlipidemia-with persistent elevated triglycerides  PLAN: 1.   [name] is doing fairly well except for her cholesterol profile which is still not at goal. I would recommend starting fenofibrate in addition to her Pravachol. She is complaining of some muscle aches, I doubt though that they're related to pravastatin. She could try a short statin holiday to see if they resolve. We'll plan to see her back in 6 months and most likely we'll recheck a lipid profile at that time.  Chrystie Nose, MD, [facility] Attending Cardiologist [facility]  Mujahid Jalomo C 03/19/2013, 4:12 PM

## 2013-03-19 NOTE — Patient Instructions (Addendum)
Your physician has recommended you make the following change in your medication: start prescription for fenofibrate. This has already been sent to your pharmacy.  Your physician recommends that you schedule a follow-up appointment in: 6 months

## 2013-03-21 ENCOUNTER — Encounter: Payer: Self-pay | Admitting: Internal Medicine

## 2013-03-22 ENCOUNTER — Telehealth: Payer: Self-pay | Admitting: Internal Medicine

## 2013-03-22 NOTE — Telephone Encounter (Signed)
Was here on Monday saw Dr Rennis Golden  Please call regarding PVCs  Last 4 hours has had them

## 2013-03-23 ENCOUNTER — Encounter: Payer: Self-pay | Admitting: Internal Medicine

## 2013-03-23 ENCOUNTER — Ambulatory Visit (INDEPENDENT_AMBULATORY_CARE_PROVIDER_SITE_OTHER): Payer: BC Managed Care – PPO | Admitting: Internal Medicine

## 2013-03-23 ENCOUNTER — Other Ambulatory Visit: Payer: Self-pay | Admitting: *Deleted

## 2013-03-23 VITALS — BP 120/66 | HR 56 | Ht 62.0 in | Wt 172.4 lb

## 2013-03-23 DIAGNOSIS — R002 Palpitations: Secondary | ICD-10-CM

## 2013-03-23 DIAGNOSIS — R5383 Other fatigue: Secondary | ICD-10-CM

## 2013-03-23 DIAGNOSIS — I493 Ventricular premature depolarization: Secondary | ICD-10-CM

## 2013-03-23 DIAGNOSIS — I4949 Other premature depolarization: Secondary | ICD-10-CM

## 2013-03-23 DIAGNOSIS — R5381 Other malaise: Secondary | ICD-10-CM

## 2013-03-23 MED ORDER — HYDROCHLOROTHIAZIDE 25 MG PO TABS: 25.0000 mg | ORAL_TABLET | Freq: Every day | ORAL | Status: DC

## 2013-03-23 NOTE — Telephone Encounter (Signed)
Woke up yesterday w/frequent pvc's which continued throughout the day.  She did experience some dizziness and felt wiped out last night.  Today feels "out of sorts".  Recommended she come in to see Dr. Rennis Golden today for EKG and eval.  Patient agreed and appt scheduled for 2 pm today.

## 2013-03-23 NOTE — Patient Instructions (Signed)
You have been referred to Dr. Ladona Ridgel (electrophysiology)  Your physician has recommended that you wear an event monitor. Event monitors are medical devices that record the heart's electrical activity. Doctors most often [place] these monitors to diagnose arrhythmias. Arrhythmias are problems with the speed or rhythm of the heartbeat. The monitor is a small, portable device. You can wear one while you do your normal daily activities. This is usually used to diagnose what is causing palpitations/syncope (passing out). You will wear this for 2 weeks.  Please schedule a follow up appointment with Dr. Rennis Golden to go over the monitor results.

## 2013-03-25 ENCOUNTER — Encounter: Payer: Self-pay | Admitting: Internal Medicine

## 2013-03-25 DIAGNOSIS — R5383 Other fatigue: Secondary | ICD-10-CM | POA: Insufficient documentation

## 2013-03-25 NOTE — Progress Notes (Signed)
OFFICE NOTE  Chief Complaint:  Routine follow-up  Primary Care Physician: Neldon Labella, MD  HPI:  [name] is a 57 year old female previously followed by Dr. [name] with a history of lower extremity edema recently and chest discomfort in the spring which she underwent a stress test for that was negative for ischemia with a preserved EF of 71%. She has had no further episodes of chest pain, and her edema has all but resolved, however, she stayed on the hydrochlorothiazide 25 mg daily. She also has hypertension which has been well controlled and a history of breast cancer but is now cancer free. Recently she had a fall and fractured her right humerus and has not been as active from that but has since started to get back to some more activity. 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 She recently had laboratory work which shows a well controlled lipid profile, total cholesterol 185, triglycerides 286, HDL 36 and LDL 92. This is on fenofibrate. She has had a history of elevated triglycerides in the past, but is nondiabetic.  She was doing very well when I saw her last week and recommended a dental holiday. She was not having any significant palpitations or PVCs that she mentioned. Less than a week after her office visit, she reported a day where she felt "terrible all day". She reported having numerous PVCs throughout the day and she says that that completely "zaps her energy". The next day she felt very fatigued and today, 2 days later she still feels quite weakened. Her EKG in the office today shows no evidence of PVCs.  This is apparently been an ongoing problem for years for her, was advanced coming paroxysmally and being associated with extreme fatigue.  PMHx:  Past Medical History  Diagnosis Date  . PVC (premature ventricular contraction)   . Hyperchloremia   . Breast CA     right - radiation & surgery  . GERD (gastroesophageal reflux  disease)   . Diverticulosis   . Gastroparesis     mild  . Hypertension   . Hyperlipidemia   . History of nuclear stress test 12/2011    bruce protocol; no evidence of ischemia, EF 79%, normal pattern of perfusion  . Family history of heart disease   . MVP (mitral valve prolapse)     last echo 07/1997 - normal LV size/function, mild MVP, mild MR    Past Surgical History  Procedure Laterality Date  . Mastectomy Right 2006    right lumpectomy  . Tonsillectomy  1969  . Appendectomy  01/2002  . Abdominal hysterectomy  03/2003  . Cardiac catheterization  10/13/2009    no significant fixed obstructive CAD, normral ramus intermediate/circumflex/RCA; normal LV function (Dr. Bishop Limbo)    FAMHx:  Family History  Problem Relation Age of Onset  . Alzheimer's disease Mother   . Heart failure Father   . Hypertension Father     also murmur, hyperlipidemia, MI, CABG age 50  . Diabetes Brother   . Heart disease Brother 45    MI at 76  . Hypertension Brother   . Hyperlipidemia Sister   . Breast cancer Maternal Aunt     PVCs  . Breast cancer Paternal Grandmother   . Diabetes Maternal Grandmother   . Heart failure Maternal Grandmother   . Emphysema Maternal Grandfather   . Heart disease Paternal Grandfather   . Asthma Child     x2  SOCHx:   reports that she has never smoked. She has never used smokeless tobacco. She reports that she drinks alcohol. She reports that she does not use illicit drugs.  ALLERGIES:  Allergies  Allergen Reactions  . Amoxicillin     REACTION: rash  . Chocolate   . Crestor [Rosuvastatin]     myalgias  . Erythromycin     REACTION: Nausea    ROS: A comprehensive review of systems was negative except for: Constitutional: positive for fevers Cardiovascular: positive for palpitations  HOME MEDS: Current Outpatient Prescriptions  Medication Sig Dispense Refill  . aspirin EC 81 MG tablet Take 81 mg by mouth daily.        Marland Kitchen atenolol (TENORMIN) 25 MG  tablet Take 1 tablet (25 mg total) by mouth 2 (two) times daily after a meal.  60 tablet  6  . Calcium Carbonate-Vitamin D (CALCIUM + D PO) Take by mouth daily.      . cetirizine (ZYRTEC) 10 MG tablet Take 10 mg by mouth daily.        Marland Kitchen esomeprazole (NEXIUM) 40 MG capsule Take 40 mg by mouth daily.       . fenofibrate (TRICOR) 145 MG tablet Take 1 tablet (145 mg total) by mouth daily.  30 tablet  6  . fish oil-omega-3 fatty acids 1000 MG capsule Take 1 g by mouth 2 (two) times daily.        Marland Kitchen glucosamine-chondroitin (COSAMIN DS) 500-400 MG tablet Take 1 tablet by mouth 2 (two) times daily.        . hydrochlorothiazide (HYDRODIURIL) 25 MG tablet Take 1 tablet (25 mg total) by mouth daily.  30 tablet  11  . mometasone (NASONEX) 50 MCG/ACT nasal spray Place 1 spray into the nose 2 (two) times daily.        . Multiple Vitamin (MULITIVITAMIN WITH MINERALS) TABS Take 1 tablet by mouth daily.        . nitroGLYCERIN (NITROSTAT) 0.4 MG SL tablet Place 0.4 mg under the tongue every 5 (five) minutes as needed for chest pain.      Marland Kitchen PARoxetine (PAXIL-CR) 12.5 MG 24 hr tablet Take 12.5 mg by mouth every morning.        . pravastatin (PRAVACHOL) 40 MG tablet Take 1 tablet (40 mg total) by mouth daily.  30 tablet  6  . zolpidem (AMBIEN) 10 MG tablet Take 10 mg by mouth at bedtime as needed for sleep.      . hydrochlorothiazide (HYDRODIURIL) 25 MG tablet Take 1 tablet (25 mg total) by mouth daily.  30 tablet  11   No current facility-administered medications for this visit.    LABS/IMAGING: No results found for this or any previous visit (from the past 48 hour(s)). No results found.  VITALS: BP 120/66  Pulse 56  Ht 5\' 2"  (1.575 m)  Wt 172 lb 6.4 oz (78.2 kg)  BMI 31.52 kg/m2  EXAM: General appearance: alert and no distress Lungs: clear to auscultation bilaterally Heart: regular rate and rhythm, S1, S2 normal, no murmur, click, rub or gallop Extremities: extremities normal, atraumatic, no cyanosis  or edema Pulses: 2+ and symmetric  EKG: Sinus bradycardia 56  ASSESSMENT: 1. PVCs-apparently reoccurring significantly with fatigue. 2. Hyperlipidemia-with persistent elevated triglycerides, on a statin holiday with little improvement in her myalgias.  PLAN: 1.   Mrs. Silberstein has had  tachypalpitations that lasted all day and reportedly left her markedly fatigued the next day with she is still recovering from.  She does seem quite exhausted today and is essentially taking naps waiting in the office. He is on a beta blocker and heart rate remains in the 50s at rest. Is difficult to know what additional treatment we can provide other than antiarrhythmic therapy. I would like to place her on a monitor to see what these episodes are if we can catch them. This may be helpful in treatment recommendations or perhaps if she has a significant burden of PVCs or ventricular arrhythmias, she may need referral to cardiac electrophysiology for other medication and/or evaluation for ablation.  Chrystie Nose, MD, [facility] Attending Cardiologist [facility]  Savalas Monje C 03/25/2013, 6:27 PM

## 2013-03-29 ENCOUNTER — Telehealth: Payer: Self-pay | Admitting: *Deleted

## 2013-03-29 ENCOUNTER — Telehealth: Payer: Self-pay | Admitting: Internal Medicine

## 2013-03-29 NOTE — Telephone Encounter (Signed)
Message forwarded to Dr. Hilty/Jenna, RN. 

## 2013-03-29 NOTE — Telephone Encounter (Signed)
Spoke to patient regarding the event monitor she received in the mail. Patient states that this monitor has 2 leads and is called an AFIB Dual Alert-Braemer [id number]. I spoke to a rep from [employer], who said that being that her insurance, [employer], was not contacted with them she was given this monitor. He said the MCOTs are not sent out as wireless events, and that the only way she could get the wireless event is if this monitor was cx and the MCOT was put on in the office. The  Braemer model would not record any information unless an "event" was declared. I called and explained the situation to the patient and she decided that having this model cx'd and the other model placed in the office was the best solution. An appointment was made for 04-02-2013 @ 3:30pm for the monitor placement. Patient voiced understanding.

## 2013-03-29 NOTE — Telephone Encounter (Signed)
Please advise her to stay off of the medication and we will address this when I see her in follow-up of her cardiac monitor.  -Dr. Rennis Golden

## 2013-03-29 NOTE — Telephone Encounter (Signed)
Dr Rennis Golden had recommended she take a break from her statin drug  approx 1 month  She is calling back to let him know she feels better and to find out what he wants to put her on now.  Also left msg on [facility] voice mail yesterday and has not heard back.  Please call

## 2013-03-30 NOTE — Telephone Encounter (Signed)
Called patient with advice per Dr. Rennis Golden to remain off statin drug until monitor f/up visit 11/3. Patient verbalized understanding.

## 2013-04-02 ENCOUNTER — Ambulatory Visit (INDEPENDENT_AMBULATORY_CARE_PROVIDER_SITE_OTHER): Payer: BC Managed Care – PPO | Admitting: *Deleted

## 2013-04-02 DIAGNOSIS — R002 Palpitations: Secondary | ICD-10-CM

## 2013-04-02 NOTE — Patient Instructions (Addendum)
Your physician has recommended that you wear an event monitor. Event monitors are medical devices that record the heart's electrical activity. Doctors most often [place] these monitors to diagnose arrhythmias. Arrhythmias are problems with the speed or rhythm of the heartbeat. The monitor is a small, portable device. You can wear one while you do your normal daily activities. This is usually used to diagnose what is causing palpitations/syncope (passing out).  You will wear this for 14 days.  14 days ID [id number], S/N [id number]

## 2013-04-03 DIAGNOSIS — R002 Palpitations: Secondary | ICD-10-CM

## 2013-04-06 ENCOUNTER — Encounter: Payer: Self-pay | Admitting: *Deleted

## 2013-04-10 ENCOUNTER — Ambulatory Visit (INDEPENDENT_AMBULATORY_CARE_PROVIDER_SITE_OTHER): Payer: BC Managed Care – PPO | Admitting: Internal Medicine

## 2013-04-10 ENCOUNTER — Encounter: Payer: Self-pay | Admitting: Internal Medicine

## 2013-04-10 VITALS — BP 128/80 | HR 73 | Ht 62.0 in | Wt 171.0 lb

## 2013-04-10 DIAGNOSIS — I4949 Other premature depolarization: Secondary | ICD-10-CM

## 2013-04-10 DIAGNOSIS — E785 Hyperlipidemia, unspecified: Secondary | ICD-10-CM

## 2013-04-10 DIAGNOSIS — I493 Ventricular premature depolarization: Secondary | ICD-10-CM

## 2013-04-10 MED ORDER — ATENOLOL 25 MG PO TABS: 25.0000 mg | ORAL_TABLET | Freq: Every day | ORAL | Status: DC

## 2013-04-10 MED ORDER — FLECAINIDE ACETATE 50 MG PO TABS: 50.0000 mg | ORAL_TABLET | Freq: Two times a day (BID) | ORAL | Status: DC

## 2013-04-10 NOTE — Assessment & Plan Note (Signed)
Based on her history and symptoms, and lack of efficacy with atenolol, I have recommended the patient start low dose flecainide which we will uptitrate as necessary. We will also reduce her dose of atenolol. She does not appear to have had an echo and this test would be helpful to comfirm my clinical suspicion that her LV function was normal. I will defer this to Dr. Blanchie Dessert office.

## 2013-04-10 NOTE — Progress Notes (Signed)
HPI [name] is referred today by Dr. Rennis Golden for evaluation of symptomatic PVC's. The patient's history dates back 15 years. She was a patient of Dr. Fredirick Maudlin and initial evaluation demonstrated mitral valve prolapse and mild MR. She no history of CAD by heart cath in 2011. The patient has been treated in the past with Atenolol and recently had her dose increased to 50 mg daily. She feels tired and is still having palpitations. She is currently wearing a cardiac monitor which has not led [place] to any understanding of her arrhythmias. She has battle breast CA with surgery, XRT and Tamoxifen. She has never had syncope. With her palpitations she feels weak, light headed, and anxious. When the episodes resolve it feels like she has "run a race". Allergies  Allergen Reactions  . Amoxicillin     REACTION: rash  . Chocolate   . Crestor [Rosuvastatin]     myalgias  . Erythromycin     REACTION: Nausea     Current Outpatient Prescriptions  Medication Sig Dispense Refill  . aspirin EC 81 MG tablet Take 81 mg by mouth daily.        . Calcium Carbonate-Vitamin D (CALCIUM + D PO) Take by mouth daily.      . cetirizine (ZYRTEC) 10 MG tablet Take 10 mg by mouth daily.        Marland Kitchen esomeprazole (NEXIUM) 40 MG capsule Take 40 mg by mouth daily.       . fenofibrate (TRICOR) 145 MG tablet Take 1 tablet (145 mg total) by mouth daily.  30 tablet  6  . fish oil-omega-3 fatty acids 1000 MG capsule Take 1 g by mouth 2 (two) times daily.        Marland Kitchen glucosamine-chondroitin (COSAMIN DS) 500-400 MG tablet Take 1 tablet by mouth 2 (two) times daily.        . hydrochlorothiazide (HYDRODIURIL) 25 MG tablet Take 1 tablet (25 mg total) by mouth daily.  30 tablet  11  . mometasone (NASONEX) 50 MCG/ACT nasal spray Place 1 spray into the nose 2 (two) times daily.        . nitroGLYCERIN (NITROSTAT) 0.4 MG SL tablet Place 0.4 mg under the tongue every 5 (five) minutes as needed for chest pain.      Marland Kitchen PARoxetine (PAXIL-CR)  12.5 MG 24 hr tablet Take 12.5 mg by mouth every morning.        . pravastatin (PRAVACHOL) 40 MG tablet ON HOLD      . zolpidem (AMBIEN) 10 MG tablet Take 10 mg by mouth at bedtime as needed for sleep.      Marland Kitchen atenolol (TENORMIN) 25 MG tablet Take 1 tablet (25 mg total) by mouth daily.  30 tablet  6  . flecainide (TAMBOCOR) 50 MG tablet Take 1 tablet (50 mg total) by mouth 2 (two) times daily.  180 tablet  3   No current facility-administered medications for this visit.     Past Medical History  Diagnosis Date  . PVC (premature ventricular contraction)   . Hyperchloremia   . Breast CA     right - radiation & surgery  . GERD (gastroesophageal reflux disease)   . Diverticulosis   . Gastroparesis     mild  . Hypertension   . Hyperlipidemia   . History of nuclear stress test 12/2011    bruce protocol; no evidence of ischemia, EF 79%, normal pattern of perfusion  . Family history of heart disease   .  MVP (mitral valve prolapse)     last echo 07/1997 - normal LV size/function, mild MVP, mild MR    ROS:   All systems reviewed and negative except as noted in the HPI.   Past Surgical History  Procedure Laterality Date  . Mastectomy Right 2006    right lumpectomy  . Tonsillectomy  1969  . Appendectomy  01/2002  . Abdominal hysterectomy  03/2003  . Cardiac catheterization  10/13/2009    no significant fixed obstructive CAD, normral ramus intermediate/circumflex/RCA; normal LV function (Dr. Bishop Limbo)     Family History  Problem Relation Age of Onset  . Alzheimer's disease Mother   . Heart failure Father   . Hypertension Father     also murmur, hyperlipidemia, MI, CABG age 52  . Diabetes Brother   . Heart disease Brother 45    MI at 43  . Hypertension Brother   . Hyperlipidemia Sister   . Breast cancer Maternal Aunt     PVCs  . Breast cancer Paternal Grandmother   . Diabetes Maternal Grandmother   . Heart failure Maternal Grandmother   . Emphysema Maternal Grandfather   .  Heart disease Paternal Grandfather   . Asthma Child     x2     History   Social History  . Marital Status: Married    Spouse Name: N/A    Number of Children: 2  . Years of Education: bachelors   Occupational History  .  [employer]   Social History Main Topics  . Smoking status: Never Smoker   . Smokeless tobacco: Never Used  . Alcohol Use: Yes  . Drug Use: No  . Sexual Activity: Not on file   Other Topics Concern  . Not on file   Social History Narrative  . No narrative on file     BP 128/80  Pulse 73  Ht 5\' 2"  (1.575 m)  Wt 171 lb (77.565 kg)  BMI 31.27 kg/m2  Physical Exam:  Well appearing 57 yo woman, NAD HEENT: Unremarkable Neck:  No JVD, no thyromegally Back:  No CVA tenderness Lungs:  Clear with no wheezes, rales, or rhonchi HEART:  Regular rate rhythm, no murmurs, no rubs, no clicks Abd:  soft, positive bowel sounds, no organomegally, no rebound, no guarding Ext:  2 plus pulses, no edema, no cyanosis, no clubbing Skin:  No rashes no nodules Neuro:  CN II through XII intact, motor grossly intact  EKG - nsr with no ectopy  Assess/Plan:

## 2013-04-10 NOTE — Patient Instructions (Signed)
Your physician recommends that you schedule a follow-up appointment in: 6 weeks with Dr Ladona Ridgel   Your physician has recommended you make the following change in your medication:  1) Start Flecainide 50mg  twice daily 2) Decrease Atenolol 25mg  daily

## 2013-04-10 NOTE — Assessment & Plan Note (Addendum)
She will continue her current meds and maintain a low fat diet.

## 2013-04-16 ENCOUNTER — Ambulatory Visit: Payer: BC Managed Care – PPO | Admitting: Internal Medicine

## 2013-04-17 ENCOUNTER — Telehealth: Payer: Self-pay | Admitting: Internal Medicine

## 2013-04-17 NOTE — Telephone Encounter (Signed)
Wearing a monitor for 15 days. [employer] was suppose to send her a message telling her when to disconnect. She have not heard anything from them,,she is breaking out with a rash.She wants to know if she can go ahead and do it?.Please call asap.

## 2013-04-17 NOTE — Telephone Encounter (Signed)
Thanks. She will need an echocardiogram per Dr. Ladona Ridgel to evaluate LV function. Indication is PVC's on anti-arryhthmic therapy.  -Dr. Rennis Golden

## 2013-04-17 NOTE — Telephone Encounter (Signed)
Returned call and pt verified x 2.  Pt informed message received and monitor supposed to end yesterday.  Pt advised to disconnect everything and pack in kit and place in UPS bag for mailing.  Pt verbalized understanding and wanted to know if Dr. Rennis Golden has enough information.  Dr. Rennis Golden notified and agreed pt can take off monitor.  Informed pt per instructions by MD/PA.  Pt verbalized understanding and agreed w/ plan.  Pt would like Dr. Rennis Golden to see OV note w/ Dr. Ladona Ridgel from last week and informed he will be notified.

## 2013-04-18 ENCOUNTER — Other Ambulatory Visit: Payer: Self-pay | Admitting: *Deleted

## 2013-04-18 ENCOUNTER — Telehealth: Payer: Self-pay | Admitting: *Deleted

## 2013-04-18 DIAGNOSIS — I493 Ventricular premature depolarization: Secondary | ICD-10-CM

## 2013-04-18 DIAGNOSIS — Z5181 Encounter for therapeutic drug level monitoring: Secondary | ICD-10-CM

## 2013-04-18 DIAGNOSIS — Z79899 Other long term (current) drug therapy: Secondary | ICD-10-CM

## 2013-04-18 DIAGNOSIS — R002 Palpitations: Secondary | ICD-10-CM

## 2013-04-18 NOTE — Telephone Encounter (Signed)
Left VM for patient stating that Dr. Rennis Golden had reviewed OV note from Dr. Ladona Ridgel and Dr. Ladona Ridgel would like an echo. Echo ordered and message sent to scheduler.

## 2013-04-23 ENCOUNTER — Encounter: Payer: Self-pay | Admitting: Internal Medicine

## 2013-04-26 ENCOUNTER — Ambulatory Visit (INDEPENDENT_AMBULATORY_CARE_PROVIDER_SITE_OTHER): Payer: BC Managed Care – PPO | Admitting: Internal Medicine

## 2013-04-26 VITALS — BP 120/72 | HR 64 | Ht 62.0 in | Wt 173.4 lb

## 2013-04-26 DIAGNOSIS — I493 Ventricular premature depolarization: Secondary | ICD-10-CM

## 2013-04-26 DIAGNOSIS — R5381 Other malaise: Secondary | ICD-10-CM

## 2013-04-26 DIAGNOSIS — I4949 Other premature depolarization: Secondary | ICD-10-CM

## 2013-04-26 DIAGNOSIS — R5383 Other fatigue: Secondary | ICD-10-CM

## 2013-04-26 DIAGNOSIS — E785 Hyperlipidemia, unspecified: Secondary | ICD-10-CM

## 2013-04-26 MED ORDER — EZETIMIBE 10 MG PO TABS: 10.0000 mg | ORAL_TABLET | Freq: Every day | ORAL | Status: DC

## 2013-04-26 NOTE — Patient Instructions (Signed)
Stop pravastatin.  Start zetia 10mg  daily.  Your physician wants you to follow-up in: 6 months. You will receive a reminder letter in the mail two months in advance. If you don't receive a letter, please call our office to schedule the follow-up appointment.

## 2013-05-02 ENCOUNTER — Encounter: Payer: Self-pay | Admitting: Internal Medicine

## 2013-05-02 NOTE — Progress Notes (Signed)
OFFICE NOTE  Chief Complaint:  Routine follow-up  Primary Care Physician: Neldon Labella, MD  HPI:  [name] is a 57 year old female previously followed by Dr. [name] with a history of lower extremity edema recently and chest discomfort in the spring which she underwent a stress test for that was negative for ischemia with a preserved EF of 71%. She has had no further episodes of chest pain, and her edema has all but resolved, however, she stayed on the hydrochlorothiazide 25 mg daily. She also has hypertension which has been well controlled and a history of breast cancer but is now cancer free. Recently she had a fall and fractured her right humerus and has not been as active from that but has since started to get back to some more activity. 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 She recently had laboratory work which shows a well controlled lipid profile, total cholesterol 185, triglycerides 286, HDL 36 and LDL 92. This is on fenofibrate. She has had a history of elevated triglycerides in the past, but is nondiabetic.  She was doing very well when I saw her last week and recommended a dental holiday. She was not having any significant palpitations or PVCs that she mentioned. Less than a week after her office visit, she reported a day where she felt "terrible all day". She reported having numerous PVCs throughout the day and she says that that completely "zaps her energy". The next day she felt very fatigued and today, 2 days later she still feels quite weakened. Her EKG in the office today shows no evidence of PVCs.  This is apparently been an ongoing problem for years for her, was advanced coming paroxysmally and being associated with extreme fatigue.  I had referred her to see Dr. Lind Guest with cardiac electrophysiology. He recommended starting her on low-dose flecainide. Since starting that she has ported feeling much better. She was  placed on a longer which showed essentially sinus rhythm and 1 atrial couplet which she noted a skipped beat otherwise no paroxysmal atrial fibrillation, PVC's, nonsustained VT or other arrhythmias were noted.  PMHx:  Past Medical History  Diagnosis Date  . PVC (premature ventricular contraction)   . Hyperchloremia   . Breast CA     right - radiation & surgery  . GERD (gastroesophageal reflux disease)   . Diverticulosis   . Gastroparesis     mild  . Hypertension   . Hyperlipidemia   . History of nuclear stress test 12/2011    bruce protocol; no evidence of ischemia, EF 79%, normal pattern of perfusion  . Family history of heart disease   . MVP (mitral valve prolapse)     last echo 07/1997 - normal LV size/function, mild MVP, mild MR    Past Surgical History  Procedure Laterality Date  . Mastectomy Right 2006    right lumpectomy  . Tonsillectomy  1969  . Appendectomy  01/2002  . Abdominal hysterectomy  03/2003  . Cardiac catheterization  10/13/2009    no significant fixed obstructive CAD, normral ramus intermediate/circumflex/RCA; normal LV function (Dr. Bishop Limbo)    FAMHx:  Family History  Problem Relation Age of Onset  . Alzheimer's disease Mother   . Heart failure Father   . Hypertension Father     also murmur, hyperlipidemia, MI, CABG age 90  . Diabetes Brother   . Heart disease Brother 45    MI at 79  .  Hypertension Brother   . Hyperlipidemia Sister   . Breast cancer Maternal Aunt     PVCs  . Breast cancer Paternal Grandmother   . Diabetes Maternal Grandmother   . Heart failure Maternal Grandmother   . Emphysema Maternal Grandfather   . Heart disease Paternal Grandfather   . Asthma Child     x2    SOCHx:   reports that she has never smoked. She has never used smokeless tobacco. She reports that she drinks alcohol. She reports that she does not use illicit drugs.  ALLERGIES:  Allergies  Allergen Reactions  . Amoxicillin     REACTION: rash  . Chocolate     . Crestor [Rosuvastatin]     myalgias  . Erythromycin     REACTION: Nausea    ROS: A comprehensive review of systems was negative except for: Constitutional: positive for fevers Cardiovascular: positive for palpitations  HOME MEDS: Current Outpatient Prescriptions  Medication Sig Dispense Refill  . aspirin EC 81 MG tablet Take 81 mg by mouth daily.        Marland Kitchen atenolol (TENORMIN) 25 MG tablet Take 1 tablet (25 mg total) by mouth daily.  30 tablet  6  . Calcium Carbonate-Vitamin D (CALCIUM + D PO) Take by mouth daily.      . cetirizine (ZYRTEC) 10 MG tablet Take 10 mg by mouth daily.        Marland Kitchen esomeprazole (NEXIUM) 40 MG capsule Take 40 mg by mouth daily.       . fenofibrate (TRICOR) 145 MG tablet Take 1 tablet (145 mg total) by mouth daily.  30 tablet  6  . fish oil-omega-3 fatty acids 1000 MG capsule Take 1 g by mouth 2 (two) times daily.        . flecainide (TAMBOCOR) 50 MG tablet Take 1 tablet (50 mg total) by mouth 2 (two) times daily.  180 tablet  3  . glucosamine-chondroitin (COSAMIN DS) 500-400 MG tablet Take 1 tablet by mouth 2 (two) times daily.        . hydrochlorothiazide (HYDRODIURIL) 25 MG tablet Take 1 tablet (25 mg total) by mouth daily.  30 tablet  11  . mometasone (NASONEX) 50 MCG/ACT nasal spray Place 1 spray into the nose 2 (two) times daily.        Marland Kitchen PARoxetine (PAXIL-CR) 12.5 MG 24 hr tablet Take 12.5 mg by mouth every morning.        . zolpidem (AMBIEN) 10 MG tablet Take 10 mg by mouth at bedtime as needed for sleep.      Marland Kitchen ezetimibe (ZETIA) 10 MG tablet Take 1 tablet (10 mg total) by mouth daily.  28 tablet  0  . nitroGLYCERIN (NITROSTAT) 0.4 MG SL tablet Place 0.4 mg under the tongue every 5 (five) minutes as needed for chest pain.       No current facility-administered medications for this visit.    LABS/IMAGING: No results found for this or any previous visit (from the past 48 hour(s)). No results found.  VITALS: BP 120/72  Pulse 64  Ht 5\' 2"  (1.575 m)   Wt 173 lb 6.4 oz (78.654 kg)  BMI 31.71 kg/m2  EXAM: General appearance: alert and no distress Lungs: clear to auscultation bilaterally Heart: regular rate and rhythm, S1, S2 normal, no murmur, click, rub or gallop Extremities: extremities normal, atraumatic, no cyanosis or edema Pulses: 2+ and symmetric  EKG: Sinus bradycardia 56  ASSESSMENT: 1. PVCs-markedly improved on flecainde. 2. Hyperlipidemia-off statins due  to intolerance  PLAN: 1.   Mrs. Morr has had marked improvement in her palpitations on flecainide. No PVCs, or other arrhythmias were noted. Will continue her on low-dose flecainide and her current dose of atenolol 25 mg daily. She is now off statins due to intolerance secondary to myalgias. We will need to consider other options for her cholesterol control. I would encourage her work on diet and exercise.  Plan to see her back in 6 months.  Chrystie Nose, MD, [facility] Attending Cardiologist [facility]  HILTY,Kenneth C 05/02/2013, 8:29 AM

## 2013-05-14 ENCOUNTER — Ambulatory Visit (HOSPITAL_COMMUNITY)
Admission: RE | Admit: 2013-05-14 | Discharge: 2013-05-14 | Disposition: A | Discharge status: Home / Self Care | Payer: BC Managed Care – PPO | Source: Ambulatory Visit | Attending: Cardiovascular Disease | Admitting: Cardiovascular Disease

## 2013-05-14 DIAGNOSIS — E785 Hyperlipidemia, unspecified: Secondary | ICD-10-CM | POA: Insufficient documentation

## 2013-05-14 DIAGNOSIS — I10 Essential (primary) hypertension: Secondary | ICD-10-CM | POA: Insufficient documentation

## 2013-05-14 DIAGNOSIS — Z5181 Encounter for therapeutic drug level monitoring: Secondary | ICD-10-CM

## 2013-05-14 DIAGNOSIS — I493 Ventricular premature depolarization: Secondary | ICD-10-CM

## 2013-05-14 DIAGNOSIS — Z79899 Other long term (current) drug therapy: Secondary | ICD-10-CM

## 2013-05-14 DIAGNOSIS — I4949 Other premature depolarization: Secondary | ICD-10-CM | POA: Insufficient documentation

## 2013-05-14 NOTE — Progress Notes (Signed)
2D Echo Performed 05/14/2013    Ariyon Gerstenberger, RCS.  

## 2013-05-22 ENCOUNTER — Encounter: Payer: Self-pay | Admitting: Internal Medicine

## 2013-05-22 ENCOUNTER — Ambulatory Visit (INDEPENDENT_AMBULATORY_CARE_PROVIDER_SITE_OTHER): Payer: BC Managed Care – PPO | Admitting: Internal Medicine

## 2013-05-22 VITALS — BP 124/76 | HR 78 | Ht 62.0 in | Wt 174.8 lb

## 2013-05-22 DIAGNOSIS — I493 Ventricular premature depolarization: Secondary | ICD-10-CM

## 2013-05-22 DIAGNOSIS — I4949 Other premature depolarization: Secondary | ICD-10-CM

## 2013-05-22 DIAGNOSIS — E785 Hyperlipidemia, unspecified: Secondary | ICD-10-CM

## 2013-05-22 NOTE — Assessment & Plan Note (Signed)
She will continue her current medical therapy. A low cholesterol diet is recommended.

## 2013-05-22 NOTE — Progress Notes (Signed)
HPI [name] returns today for followup. She is a 57 year old woman with a history of symptomatic palpitations, hypertension, who I saw several months ago. At that time she was having symptomatic PACs and PVCs did not responded to beta blocker therapy. The patient was placed on flecainide 50 mg twice daily and returns today for followup. She has done well and her palpitations are markedly improved. Her only complaint today is some bloating and acid reflux type symptoms. She has not been taking her Nexium. She denies syncope, chest pain, or shortness of breath. Allergies  Allergen Reactions  . Amoxicillin     REACTION: rash  . Chocolate   . Crestor [Rosuvastatin]     myalgias  . Erythromycin     REACTION: Nausea     Current Outpatient Prescriptions  Medication Sig Dispense Refill  . aspirin EC 81 MG tablet Take 81 mg by mouth daily.        . Calcium Carbonate-Vitamin D (CALCIUM + D PO) Take 1 tablet by mouth at bedtime.       . cetirizine (ZYRTEC) 10 MG tablet Take 10 mg by mouth daily.        Marland Kitchen esomeprazole (NEXIUM) 40 MG capsule Take 40 mg by mouth daily.       Marland Kitchen ezetimibe (ZETIA) 10 MG tablet Take 1 tablet (10 mg total) by mouth daily.  28 tablet  0  . fenofibrate (TRICOR) 145 MG tablet Take 1 tablet (145 mg total) by mouth daily.  30 tablet  6  . fish oil-omega-3 fatty acids 1000 MG capsule Take 1 g by mouth 2 (two) times daily.        . flecainide (TAMBOCOR) 50 MG tablet Take 1 tablet (50 mg total) by mouth 2 (two) times daily.  180 tablet  3  . glucosamine-chondroitin (COSAMIN DS) 500-400 MG tablet Take 1 tablet by mouth 2 (two) times daily.        . hydrochlorothiazide (HYDRODIURIL) 25 MG tablet Take 1 tablet (25 mg total) by mouth daily.  30 tablet  11  . mometasone (NASONEX) 50 MCG/ACT nasal spray Place 1 spray into the nose daily.       . nitroGLYCERIN (NITROSTAT) 0.4 MG SL tablet Place 0.4 mg under the tongue every 5 (five) minutes as needed for chest pain.      Marland Kitchen  PARoxetine (PAXIL-CR) 12.5 MG 24 hr tablet Take 12.5 mg by mouth every morning.        . zolpidem (AMBIEN) 10 MG tablet Take 10 mg by mouth at bedtime as needed for sleep.       No current facility-administered medications for this visit.     Past Medical History  Diagnosis Date  . PVC (premature ventricular contraction)   . Hyperchloremia   . Breast CA     right - radiation & surgery  . GERD (gastroesophageal reflux disease)   . Diverticulosis   . Gastroparesis     mild  . Hypertension   . Hyperlipidemia   . History of nuclear stress test 12/2011    bruce protocol; no evidence of ischemia, EF 79%, normal pattern of perfusion  . Family history of heart disease   . MVP (mitral valve prolapse)     last echo 07/1997 - normal LV size/function, mild MVP, mild MR    ROS:   All systems reviewed and negative except as noted in the HPI.   Past Surgical History  Procedure Laterality Date  . Mastectomy  Right 2006    right lumpectomy  . Tonsillectomy  1969  . Appendectomy  01/2002  . Abdominal hysterectomy  03/2003  . Cardiac catheterization  10/13/2009    no significant fixed obstructive CAD, normral ramus intermediate/circumflex/RCA; normal LV function (Dr. Bishop Limbo)     Family History  Problem Relation Age of Onset  . Alzheimer's disease Mother   . Heart failure Father   . Hypertension Father     also murmur, hyperlipidemia, MI, CABG age 68  . Diabetes Brother   . Heart disease Brother 45    MI at 76  . Hypertension Brother   . Hyperlipidemia Sister   . Breast cancer Maternal Aunt     PVCs  . Breast cancer Paternal Grandmother   . Diabetes Maternal Grandmother   . Heart failure Maternal Grandmother   . Emphysema Maternal Grandfather   . Heart disease Paternal Grandfather   . Asthma Child     x2     History   Social History  . Marital Status: Married    Spouse Name: N/A    Number of Children: 2  . Years of Education: bachelors   Occupational History  .   [employer]   Social History Main Topics  . Smoking status: Never Smoker   . Smokeless tobacco: Never Used  . Alcohol Use: Yes  . Drug Use: No  . Sexual Activity: Not on file   Other Topics Concern  . Not on file   Social History Narrative  . No narrative on file     BP 124/76  Pulse 78  Ht 5\' 2"  (1.575 m)  Wt 174 lb 12.8 oz (79.289 kg)  BMI 31.96 kg/m2  Physical Exam:  Well appearing 57 year old woman, NAD HEENT: Unremarkable Neck:  6 cm JVD, no thyromegally Back:  No CVA tenderness Lungs:  Clear with no wheezes, rales, or rhonchi. HEART:  Regular rate rhythm, no murmurs, no rubs, no clicks Abd:  soft, positive bowel sounds, no organomegally, no rebound, no guarding Ext:  2 plus pulses, no edema, no cyanosis, no clubbing Skin:  No rashes no nodules Neuro:  CN II through XII intact, motor grossly intact  CardioNet monitor - rare PVCs  Assess/Plan:

## 2013-05-22 NOTE — Assessment & Plan Note (Signed)
Her PVCs and symptomatic palpitations are much improved. She will continue flecainide 50 mg twice daily. She will decrease her dose of atenolol over the next several weeks from 25 mg daily at 12.5 mg daily, and after 6 weeks, stop atenolol altogether. She will call us if she has recurrent symptoms.

## 2013-05-22 NOTE — Patient Instructions (Addendum)
Your physician wants you to follow-up in:6 months with Dr Court Joy will receive a reminder letter in the mail two months in advance. If you don't receive a letter, please call our office to schedule the follow-up appointment.  Your physician has recommended you make the following change in your medication:  1) Decrease Atenolol to 12.5mg  daily and then on 07/15/13 stop

## 2013-07-06 ENCOUNTER — Ambulatory Visit (INDEPENDENT_AMBULATORY_CARE_PROVIDER_SITE_OTHER): Payer: BC Managed Care – PPO | Admitting: Internal Medicine

## 2013-07-06 ENCOUNTER — Encounter: Payer: Self-pay | Admitting: Internal Medicine

## 2013-07-06 VITALS — BP 127/74 | HR 62 | Ht 62.0 in | Wt 174.0 lb

## 2013-07-06 DIAGNOSIS — I493 Ventricular premature depolarization: Secondary | ICD-10-CM

## 2013-07-06 DIAGNOSIS — I4949 Other premature depolarization: Secondary | ICD-10-CM

## 2013-07-06 NOTE — Assessment & Plan Note (Signed)
Her symptoms are currently well controlled. She will continue her current meds.

## 2013-07-06 NOTE — Patient Instructions (Signed)
Your physician recommends that you continue on your current medications as directed. Please refer to the Current Medication list given to you today.  Your physician wants you to follow-up in: 1 year with Dr. Taylor.  You will receive a reminder letter in the mail two months in advance. If you don't receive a letter, please call our office to schedule the follow-up appointment.  

## 2013-07-06 NOTE — Progress Notes (Signed)
HPI [name] returns today for followup. She is a 58 year old woman with a history of symptomatic palpitations, hypertension, who I saw 2 months ago. She has done well and her palpitations are markedly improved. She has tolerated her flecainide nicely. She denies syncope, chest pain, or shortness of breath. Allergies  Allergen Reactions  . Amoxicillin     REACTION: rash  . Chocolate   . Crestor [Rosuvastatin]     myalgias  . Erythromycin     REACTION: Nausea     Current Outpatient Prescriptions  Medication Sig Dispense Refill  . aspirin EC 81 MG tablet Take 81 mg by mouth daily.        . Calcium Carbonate-Vitamin D (CALCIUM + D PO) Take 1 tablet by mouth at bedtime.       . cetirizine (ZYRTEC) 10 MG tablet Take 10 mg by mouth daily.        Marland Kitchen esomeprazole (NEXIUM) 40 MG capsule Take 40 mg by mouth daily.       Marland Kitchen ezetimibe (ZETIA) 10 MG tablet Take 1 tablet (10 mg total) by mouth daily.  28 tablet  0  . fenofibrate (TRICOR) 145 MG tablet Take 1 tablet (145 mg total) by mouth daily.  30 tablet  6  . fish oil-omega-3 fatty acids 1000 MG capsule Take 1 g by mouth 2 (two) times daily.        . flecainide (TAMBOCOR) 50 MG tablet Take 1 tablet (50 mg total) by mouth 2 (two) times daily.  180 tablet  3  . glucosamine-chondroitin (COSAMIN DS) 500-400 MG tablet Take 1 tablet by mouth 2 (two) times daily.        . hydrochlorothiazide (HYDRODIURIL) 25 MG tablet Take 1 tablet (25 mg total) by mouth daily.  30 tablet  11  . mometasone (NASONEX) 50 MCG/ACT nasal spray Place 1 spray into the nose daily.       . nitroGLYCERIN (NITROSTAT) 0.4 MG SL tablet Place 0.4 mg under the tongue every 5 (five) minutes as needed for chest pain.      Marland Kitchen PARoxetine (PAXIL-CR) 12.5 MG 24 hr tablet Take 12.5 mg by mouth every morning.        . zolpidem (AMBIEN) 10 MG tablet Take 10 mg by mouth at bedtime as needed for sleep.       No current facility-administered medications for this visit.     Past  Medical History  Diagnosis Date  . PVC (premature ventricular contraction)   . Hyperchloremia   . Breast CA     right - radiation & surgery  . GERD (gastroesophageal reflux disease)   . Diverticulosis   . Gastroparesis     mild  . Hypertension   . Hyperlipidemia   . History of nuclear stress test 12/2011    bruce protocol; no evidence of ischemia, EF 79%, normal pattern of perfusion  . Family history of heart disease   . MVP (mitral valve prolapse)     last echo 07/1997 - normal LV size/function, mild MVP, mild MR    ROS:   All systems reviewed and negative except as noted in the HPI.   Past Surgical History  Procedure Laterality Date  . Mastectomy Right 2006    right lumpectomy  . Tonsillectomy  1969  . Appendectomy  01/2002  . Abdominal hysterectomy  03/2003  . Cardiac catheterization  10/13/2009    no significant fixed obstructive CAD, normral ramus intermediate/circumflex/RCA; normal LV function (Dr. Darene Lamer.  Claiborne Billings)     Family History  Problem Relation Age of Onset  . Alzheimer's disease Mother   . Heart failure Father   . Hypertension Father     also murmur, hyperlipidemia, MI, CABG age 104  . Diabetes Brother   . Heart disease Brother 42    MI at 31  . Hypertension Brother   . Hyperlipidemia Sister   . Breast cancer Maternal Aunt     PVCs  . Breast cancer Paternal Grandmother   . Diabetes Maternal Grandmother   . Heart failure Maternal Grandmother   . Emphysema Maternal Grandfather   . Heart disease Paternal Grandfather   . Asthma Child     x2     History   Social History  . Marital Status: Married    Spouse Name: N/A    Number of Children: 2  . Years of Education: bachelors   Occupational History  .  [employer]   Social History Main Topics  . Smoking status: Never Smoker   . Smokeless tobacco: Never Used  . Alcohol Use: Yes  . Drug Use: No  . Sexual Activity: Not on file   Other Topics Concern  . Not on file   Social History Narrative  . No  narrative on file     BP 127/74  Pulse 62  Ht 5\' 2"  (1.575 m)  Wt 174 lb (78.926 kg)  BMI 31.82 kg/m2  Physical Exam:  Well appearing 58 year old woman, NAD HEENT: Unremarkable Neck:  6 cm JVD, no thyromegally Back:  No CVA tenderness Lungs:  Clear with no wheezes, rales, or rhonchi. HEART:  Regular rate rhythm, no murmurs, no rubs, no clicks Abd:  soft, positive bowel sounds, no organomegally, no rebound, no guarding Ext:  2 plus pulses, no edema, no cyanosis, no clubbing Skin:  No rashes no nodules Neuro:  CN II through XII intact, motor grossly intact   ECG - NSR with a narrow QRS  Assess/Plan:

## 2013-07-12 ENCOUNTER — Telehealth: Payer: Self-pay | Admitting: Internal Medicine

## 2013-07-12 DIAGNOSIS — I493 Ventricular premature depolarization: Secondary | ICD-10-CM

## 2013-07-12 MED ORDER — FLECAINIDE ACETATE 50 MG PO TABS: 75.0000 mg | ORAL_TABLET | Freq: Two times a day (BID) | ORAL | Status: DC

## 2013-07-12 NOTE — Telephone Encounter (Signed)
New problem   Pt is experiencing PVC'S and need to speak to nurse concerning this matter.

## 2013-07-12 NOTE — Telephone Encounter (Signed)
Spoke with patient and since stopping her BB she has noticed she has been having PVC's more frequently   Dr Lovena Le advised her to increase her Flecainide to 75mg  bid She will increase her Flecainide and call me back in a few weeks to let me know how she is feeling

## 2013-08-02 ENCOUNTER — Telehealth: Payer: Self-pay | Admitting: *Deleted

## 2013-08-02 NOTE — Telephone Encounter (Signed)
Message forwarded to Jenna, RN.  

## 2013-08-02 NOTE — Telephone Encounter (Signed)
Returned call and pt verified x 2.  Pt informed lab was ordered at last OV in November and if she does not have lab slip, she can present fasting to any [facility] lab to have drawn before appt.  Advised she have lab call office if any problems when she arrives. Pt verbalized understanding and agreed w/ plan.

## 2013-08-02 NOTE — Telephone Encounter (Signed)
Pt called to schedule her appointment with Dr. Debara Pickett and she is scheduled for April 7th and she needs to get blood work done and does not have a slip. She asked if we can mail her one.  [place]

## 2013-08-02 NOTE — Telephone Encounter (Signed)
Noted that NMR was ordered at last OV with Dr Debara Pickett and has not been drawn. Lab order requisition re-printed and mailed to patient.

## 2013-08-14 ENCOUNTER — Telehealth: Payer: Self-pay | Admitting: Internal Medicine

## 2013-08-14 NOTE — Telephone Encounter (Signed)
New message     Need to give nurse update on medications changes

## 2013-08-15 NOTE — Telephone Encounter (Signed)
Left message for patient to return my call.

## 2013-08-22 NOTE — Telephone Encounter (Signed)
Left message for her to return my call again.  Let her know if she still needs me to please call back and I will be glad to help

## 2013-09-12 LAB — NMR LIPOPROFILE WITH LIPIDS
Cholesterol, Total: 187 mg/dL (ref <200)
HDL Particle Number: 31.5 umol/L (ref >=30.5)
HDL Size: 8.4 nm — ABNORMAL LOW (ref >=9.2)
HDL-C: 36 mg/dL — ABNORMAL LOW (ref >=40)
LDL (calc): 116 mg/dL — ABNORMAL HIGH (ref <100)
LDL Particle Number: 1824 nmol/L — ABNORMAL HIGH (ref <1000)
LDL Size: 20.3 nm — ABNORMAL LOW (ref >20.5)
LP-IR Score: 100 — ABNORMAL HIGH (ref <=45)
Large HDL-P: 1.6 umol/L — ABNORMAL LOW (ref >=4.8)
Large VLDL-P: 11.2 nmol/L — ABNORMAL HIGH (ref <=2.7)
Small LDL Particle Number: 1124 nmol/L — ABNORMAL HIGH (ref <=527)
Triglycerides: 173 mg/dL — ABNORMAL HIGH (ref <150)
VLDL Size: 66.3 nm — ABNORMAL HIGH (ref <=46.6)

## 2013-09-18 ENCOUNTER — Ambulatory Visit (INDEPENDENT_AMBULATORY_CARE_PROVIDER_SITE_OTHER): Payer: BC Managed Care – PPO | Admitting: Internal Medicine

## 2013-09-18 ENCOUNTER — Encounter: Payer: Self-pay | Admitting: Internal Medicine

## 2013-09-18 VITALS — BP 110/68 | HR 63 | Ht 62.0 in | Wt 170.1 lb

## 2013-09-18 DIAGNOSIS — I4949 Other premature depolarization: Secondary | ICD-10-CM

## 2013-09-18 DIAGNOSIS — E785 Hyperlipidemia, unspecified: Secondary | ICD-10-CM

## 2013-09-18 DIAGNOSIS — I493 Ventricular premature depolarization: Secondary | ICD-10-CM

## 2013-09-18 MED ORDER — SIMVASTATIN 20 MG PO TABS: 20.0000 mg | ORAL_TABLET | Freq: Every day | ORAL | Status: DC

## 2013-09-18 NOTE — Patient Instructions (Addendum)
Your physician recommends that you return for lab work in: 6 months - fasting  START simvastatin 20mg  once daily.   Your physician wants you to follow-up in: 6 months with Dr. Debara Pickett. You will receive a reminder letter in the mail two months in advance. If you don't receive a letter, please call our office to schedule the follow-up appointment.

## 2013-09-18 NOTE — Progress Notes (Signed)
OFFICE NOTE  Chief Complaint:  Routine follow-up  Primary Care Physician: Tawanna Solo, MD  HPI:  [name] is a 58 year old female previously followed by Dr. Rex Kras with a history of lower extremity edema recently and chest discomfort in the spring which she underwent a stress test for that was negative for ischemia with a preserved EF of 71%. She has had no further episodes of chest pain, and her edema has all but resolved, however, she stayed on the hydrochlorothiazide 25 mg daily. She also has hypertension which has been well controlled and a history of breast cancer but is now cancer free. Recently she had a fall and fractured her right humerus and has not been as active from that but has since started to get back to some more activity. 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 She recently had laboratory work which shows a well controlled lipid profile, total cholesterol 185, triglycerides 286, HDL 36 and LDL 92. This is on fenofibrate. She has had a history of elevated triglycerides in the past, but is nondiabetic.  She was doing very well when I saw her last week and recommended a dental holiday. She was not having any significant palpitations or PVCs that she mentioned. Less than a week after her office visit, she reported a day where she felt "terrible all day". She reported having numerous PVCs throughout the day and she says that that completely "zaps her energy". The next day she felt very fatigued and today, 2 days later she still feels quite weakened. Her EKG in the office today shows no evidence of PVCs.  This is apparently been an ongoing problem for years for her, was advanced coming paroxysmally and being associated with extreme fatigue.  I had referred her to see Dr. Cristopher Peru with cardiac electrophysiology. He recommended starting her on low-dose flecainide. Since starting that she has reported feeling much better. She  was placed on a longer monitor which showed essentially sinus rhythm and 1 atrial couplet which she noted a skipped beat otherwise no paroxysmal atrial fibrillation, PVC's, nonsustained VT or other arrhythmias were noted. Dr. Lovena Le recommended weaning off atenolol and she is currently on flecainide 75 mg twice daily.  PMHx:  Past Medical History  Diagnosis Date  . PVC (premature ventricular contraction)   . Hyperchloremia   . Breast CA     right - radiation & surgery  . GERD (gastroesophageal reflux disease)   . Diverticulosis   . Gastroparesis     mild  . Hypertension   . Hyperlipidemia   . History of nuclear stress test 12/2011    bruce protocol; no evidence of ischemia, EF 79%, normal pattern of perfusion  . Family history of heart disease   . MVP (mitral valve prolapse)     last echo 07/1997 - normal LV size/function, mild MVP, mild MR    Past Surgical History  Procedure Laterality Date  . Mastectomy Right 2006    right lumpectomy  . Tonsillectomy  1969  . Appendectomy  01/2002  . Abdominal hysterectomy  03/2003  . Cardiac catheterization  10/13/2009    no significant fixed obstructive CAD, normral ramus intermediate/circumflex/RCA; normal LV function (Dr. Corky Downs)    FAMHx:  Family History  Problem Relation Age of Onset  . Alzheimer's disease Mother   . Heart failure Father   . Hypertension Father     also murmur, hyperlipidemia, MI, CABG age 60  .  Diabetes Brother   . Heart disease Brother 46    MI at 81  . Hypertension Brother   . Hyperlipidemia Sister   . Breast cancer Maternal Aunt     PVCs  . Breast cancer Paternal Grandmother   . Diabetes Maternal Grandmother   . Heart failure Maternal Grandmother   . Emphysema Maternal Grandfather   . Heart disease Paternal Grandfather   . Asthma Child     x2    SOCHx:   reports that she has never smoked. She has never used smokeless tobacco. She reports that she drinks alcohol. She reports that she does not use  illicit drugs.  ALLERGIES:  Allergies  Allergen Reactions  . Amoxicillin     REACTION: rash  . Chocolate   . Crestor [Rosuvastatin]     myalgias  . Erythromycin     REACTION: Nausea    ROS: A comprehensive review of systems was negative.  HOME MEDS: Current Outpatient Prescriptions  Medication Sig Dispense Refill  . aspirin EC 81 MG tablet Take 81 mg by mouth daily.        . Calcium Carbonate-Vitamin D (CALCIUM + D PO) Take 1 tablet by mouth at bedtime.       . cetirizine (ZYRTEC) 10 MG tablet Take 10 mg by mouth daily.        Marland Kitchen esomeprazole (NEXIUM) 40 MG capsule Take 40 mg by mouth daily.       Marland Kitchen ezetimibe (ZETIA) 10 MG tablet Take 1 tablet (10 mg total) by mouth daily.  28 tablet  0  . fenofibrate (TRICOR) 145 MG tablet Take 1 tablet (145 mg total) by mouth daily.  30 tablet  6  . fish oil-omega-3 fatty acids 1000 MG capsule Take 1 g by mouth 2 (two) times daily.        . flecainide (TAMBOCOR) 50 MG tablet Take 1.5 tablets (75 mg total) by mouth 2 (two) times daily.  90 tablet  3  . glucosamine-chondroitin (COSAMIN DS) 500-400 MG tablet Take 1 tablet by mouth 2 (two) times daily.        . hydrochlorothiazide (HYDRODIURIL) 25 MG tablet Take 1 tablet (25 mg total) by mouth daily.  30 tablet  11  . mometasone (NASONEX) 50 MCG/ACT nasal spray Place 1 spray into the nose daily.       Marland Kitchen PARoxetine (PAXIL-CR) 12.5 MG 24 hr tablet Take 12.5 mg by mouth every morning.        . zolpidem (AMBIEN) 10 MG tablet Take 10 mg by mouth at bedtime as needed for sleep.      . simvastatin (ZOCOR) 20 MG tablet Take 1 tablet (20 mg total) by mouth at bedtime.  30 tablet  5   No current facility-administered medications for this visit.    LABS/IMAGING: No results found for this or any previous visit (from the past 48 hour(s)). No results found.  VITALS: BP 110/68  Pulse 63  Ht 5\' 2"  (1.575 m)  Wt 170 lb 1.6 oz (77.157 kg)  BMI 31.10 kg/m2  EXAM: General appearance: alert and no  distress Neck: no adenopathy, no carotid bruit, no JVD, supple, symmetrical, trachea midline and thyroid not enlarged, symmetric, no tenderness/mass/nodules Heart: regular rate and rhythm, S1, S2 normal, no murmur, click, rub or gallop Extremities: extremities normal, atraumatic, no cyanosis or edema Pulses: 2+ and symmetric  EKG: Normal sinus rhythm at 63  ASSESSMENT: 1. PVCs-markedly improved on flecainde. 2. Hyperlipidemia-off statins due to intolerance  PLAN:  1.   Mrs. Mewborn has had marked improvement in her palpitations on flecainide. No PVCs, or other arrhythmias were noted. Will continue her on low-dose flecainide and her current dose of atenolol 25 mg daily. She is now off statins due to intolerance secondary to myalgias. Have been treating her with Zetia. We repeated her labs today which show a high particle number over 1800 and LDL greater than 120. Her triglycerides are improving on fenofibrate, but not quite at goal. She continues to work on diet and weight loss. She is willing to try another statin therefore I would recommend low-dose simvastatin 20 mg daily. If she is able to tolerate this, we may want to combine the medications and treat her with Vytorin 10/20.  On another note, she tells me that recently she was treated with antibiotics both for UTI and pulmonary infections. She was given ciprofloxacin for UTI and a Z-Pak for her upper respiratory infection. I urged her to use caution with these medications as they can significantly prolong the QT interval in a patient taking flecainide. She is advised to discuss with her prescribing physician about what antibiotics are safe to take with flecainide. A list of medications can be found on the website: QTdrugs.org.  Plan repeat lipid NMR in 6 months.  Pixie Casino, MD, [facility] Attending Cardiologist [facility]  Tinsley Everman C 09/18/2013, 7:25 PM

## 2013-09-21 ENCOUNTER — Other Ambulatory Visit: Payer: Self-pay | Admitting: Obstetrics and Gynecology

## 2013-10-16 ENCOUNTER — Other Ambulatory Visit: Payer: Self-pay | Admitting: *Deleted

## 2013-10-16 MED ORDER — FENOFIBRATE 145 MG PO TABS: 145.0000 mg | ORAL_TABLET | Freq: Every day | ORAL | Status: DC

## 2013-10-16 NOTE — Telephone Encounter (Signed)
Rx refill sent to patient pharmacy   

## 2013-11-11 ENCOUNTER — Other Ambulatory Visit: Payer: Self-pay | Admitting: Internal Medicine

## 2013-11-20 ENCOUNTER — Ambulatory Visit (INDEPENDENT_AMBULATORY_CARE_PROVIDER_SITE_OTHER): Payer: BC Managed Care – PPO | Admitting: Internal Medicine

## 2013-11-20 ENCOUNTER — Encounter: Payer: Self-pay | Admitting: Internal Medicine

## 2013-11-20 VITALS — BP 131/86 | HR 61 | Ht 62.0 in | Wt 166.8 lb

## 2013-11-20 DIAGNOSIS — I4949 Other premature depolarization: Secondary | ICD-10-CM

## 2013-11-20 DIAGNOSIS — I493 Ventricular premature depolarization: Secondary | ICD-10-CM

## 2013-11-20 NOTE — Patient Instructions (Signed)
Your physician recommends that you continue on your current medications as directed. Please refer to the Current Medication list given to you today.  Your physician wants you to follow-up in: 1 year with Dr. Taylor.  You will receive a reminder letter in the mail two months in advance. If you don't receive a letter, please call our office to schedule the follow-up appointment.  

## 2013-11-20 NOTE — Assessment & Plan Note (Signed)
Her symptoms appear to be well controlled. Will continue flecainide.

## 2013-11-20 NOTE — Progress Notes (Signed)
HPI [name] returns today for followup. She is a 58 year old woman with a history of symptomatic palpitations, hypertension, who I saw over 6 months ago. She has done well and her palpitations are markedly improved. She has tolerated her flecainide nicely. She denies syncope, chest pain, or shortness of breath. Allergies  Allergen Reactions  . Amoxicillin     REACTION: rash  . Chocolate   . Crestor [Rosuvastatin]     myalgias  . Erythromycin     REACTION: Nausea     Current Outpatient Prescriptions  Medication Sig Dispense Refill  . aspirin EC 81 MG tablet Take 81 mg by mouth daily.        . Calcium Carbonate-Vitamin D (CALCIUM + D PO) Take 1 tablet by mouth at bedtime.       . cetirizine (ZYRTEC) 10 MG tablet Take 10 mg by mouth daily.        Marland Kitchen esomeprazole (NEXIUM) 40 MG capsule Take 40 mg by mouth daily.       Marland Kitchen ezetimibe (ZETIA) 10 MG tablet Take 1 tablet (10 mg total) by mouth daily.  28 tablet  0  . fenofibrate (TRICOR) 145 MG tablet Take 1 tablet (145 mg total) by mouth daily.  30 tablet  6  . fish oil-omega-3 fatty acids 1000 MG capsule Take 1 g by mouth 2 (two) times daily.        . flecainide (TAMBOCOR) 50 MG tablet TAKE 1&1/2 TABLETS TWICE DAILY.  90 tablet  3  . glucosamine-chondroitin (COSAMIN DS) 500-400 MG tablet Take 1 tablet by mouth 2 (two) times daily.        . hydrochlorothiazide (HYDRODIURIL) 25 MG tablet Take 1 tablet (25 mg total) by mouth daily.  30 tablet  11  . mometasone (NASONEX) 50 MCG/ACT nasal spray Place 1 spray into the nose daily.       Marland Kitchen PARoxetine (PAXIL-CR) 12.5 MG 24 hr tablet Take 12.5 mg by mouth every morning.        . zolpidem (AMBIEN) 10 MG tablet Take 10 mg by mouth at bedtime as needed for sleep.       No current facility-administered medications for this visit.     Past Medical History  Diagnosis Date  . PVC (premature ventricular contraction)   . Hyperchloremia   . Breast CA     right - radiation & surgery  . GERD  (gastroesophageal reflux disease)   . Diverticulosis   . Gastroparesis     mild  . Hypertension   . Hyperlipidemia   . History of nuclear stress test 12/2011    bruce protocol; no evidence of ischemia, EF 79%, normal pattern of perfusion  . Family history of heart disease   . MVP (mitral valve prolapse)     last echo 07/1997 - normal LV size/function, mild MVP, mild MR    ROS:   All systems reviewed and negative except as noted in the HPI.   Past Surgical History  Procedure Laterality Date  . Mastectomy Right 2006    right lumpectomy  . Tonsillectomy  1969  . Appendectomy  01/2002  . Abdominal hysterectomy  03/2003  . Cardiac catheterization  10/13/2009    no significant fixed obstructive CAD, normral ramus intermediate/circumflex/RCA; normal LV function (Dr. Corky Downs)     Family History  Problem Relation Age of Onset  . Alzheimer's disease Mother   . Heart failure Father   . Hypertension Father  also murmur, hyperlipidemia, MI, CABG age 34  . Diabetes Brother   . Heart disease Brother 38    MI at 3  . Hypertension Brother   . Hyperlipidemia Sister   . Breast cancer Maternal Aunt     PVCs  . Breast cancer Paternal Grandmother   . Diabetes Maternal Grandmother   . Heart failure Maternal Grandmother   . Emphysema Maternal Grandfather   . Heart disease Paternal Grandfather   . Asthma Child     x2     History   Social History  . Marital Status: Married    Spouse Name: N/A    Number of Children: 2  . Years of Education: bachelors   Occupational History  .  [employer]   Social History Main Topics  . Smoking status: Never Smoker   . Smokeless tobacco: Never Used  . Alcohol Use: Yes  . Drug Use: No  . Sexual Activity: Not on file   Other Topics Concern  . Not on file   Social History Narrative  . No narrative on file     BP 131/86  Pulse 61  Ht 5\' 2"  (1.575 m)  Wt 166 lb 12.8 oz (75.66 kg)  BMI 30.50 kg/m2  Physical Exam:  Well appearing  58 year old woman, NAD HEENT: Unremarkable Neck:  6 cm JVD, no thyromegally Back:  No CVA tenderness Lungs:  Clear with no wheezes, rales, or rhonchi. HEART:  Regular rate rhythm, no murmurs, no rubs, no clicks Abd:  soft, positive bowel sounds, no organomegally, no rebound, no guarding Ext:  2 plus pulses, no edema, no cyanosis, no clubbing Skin:  No rashes no nodules Neuro:  CN II through XII intact, motor grossly intact   ECG - NSR with a narrow QRS  Assess/Plan:

## 2013-12-03 ENCOUNTER — Telehealth: Payer: Self-pay | Admitting: Internal Medicine

## 2013-12-03 NOTE — Telephone Encounter (Signed)
Pt had problems with abdominal pain and nausea. States the pain was much worse when she was lying down. Pt saw her PCP today and they called and made appt for her in July. Pt wanted to know if she could be seen sooner. Pt scheduled to see Dr. Henrene Pastor tomorrow at 8:30am. Pt aware of appt.

## 2013-12-04 ENCOUNTER — Encounter: Payer: Self-pay | Admitting: Internal Medicine

## 2013-12-04 ENCOUNTER — Other Ambulatory Visit (INDEPENDENT_AMBULATORY_CARE_PROVIDER_SITE_OTHER): Payer: BC Managed Care – PPO

## 2013-12-04 ENCOUNTER — Ambulatory Visit (INDEPENDENT_AMBULATORY_CARE_PROVIDER_SITE_OTHER): Payer: BC Managed Care – PPO | Admitting: Internal Medicine

## 2013-12-04 VITALS — BP 106/78 | HR 78 | Ht 62.0 in | Wt 168.0 lb

## 2013-12-04 DIAGNOSIS — K5730 Diverticulosis of large intestine without perforation or abscess without bleeding: Secondary | ICD-10-CM

## 2013-12-04 DIAGNOSIS — K219 Gastro-esophageal reflux disease without esophagitis: Secondary | ICD-10-CM

## 2013-12-04 DIAGNOSIS — R110 Nausea: Secondary | ICD-10-CM

## 2013-12-04 DIAGNOSIS — R1011 Right upper quadrant pain: Secondary | ICD-10-CM

## 2013-12-04 LAB — HEPATIC FUNCTION PANEL
ALT: 34 U/L (ref 0–35)
AST: 25 U/L (ref 0–37)
Albumin: 4.5 g/dL (ref 3.5–5.2)
Alkaline Phosphatase: 52 U/L (ref 39–117)
Bilirubin, Direct: 0.1 mg/dL (ref 0.0–0.3)
Total Bilirubin: 0.6 mg/dL (ref 0.2–1.2)
Total Protein: 7.2 g/dL (ref 6.0–8.3)

## 2013-12-04 LAB — AMYLASE: Amylase: 41 U/L (ref 27–131)

## 2013-12-04 LAB — LIPASE: Lipase: 29 U/L (ref 11.0–59.0)

## 2013-12-04 MED ORDER — HYOSCYAMINE SULFATE 0.125 MG SL SUBL: SUBLINGUAL_TABLET | SUBLINGUAL | Status: DC

## 2013-12-04 NOTE — Patient Instructions (Signed)
Your physician has requested that you go to the basement for lab work before leaving today  We have sent the following medications to your pharmacy for you to pick up at your convenience:  Levsin  You have been scheduled for an abdominal ultrasound at [facility] (1st floor of hospital) on 12/06/2013 at 8:00am. Please arrive 15 minutes prior to your appointment for registration. Make certain not to have anything to eat or drink 6 hours prior to your appointment. Should you need to reschedule your appointment, please contact radiology at [phone]. This test typically takes about 30 minutes to perform.

## 2013-12-04 NOTE — Progress Notes (Signed)
HISTORY OF PRESENT ILLNESS:  [name] is a 58 y.o. female with a history of hypertension, hyperlipidemia, obesity, breast cancer, GERD requiring PPI, and IBS. She was evaluated in 2011 with abdominal bloating,, abdominal discomfort, and nausea. She was subsequently evaluated April 2014 for epigastric and right upper quadrant pain. See that dictation. Evaluations include abdominal ultrasound, HIDA scan, CT scan of the abdomen and pelvis, colonoscopy, and upper endoscopy... No significant abnormalities identified. She was continued on PPI and treated with fiber. She reports doing well until the day before yesterday when she developed sharp lower chest pain with radiation to the right upper quadrant and right back. This was described as sharp lasting 45 minutes. There was some associated nausea and dizziness. Symptoms resolved only to recur several hours later. This time lasting 3 hours. Symptoms made worse with lying down. No improvement with an acid. Yesterday she felt somewhat sore and bloated. She continues on PPI. No different activities are meals. No new medications. She has had cardiac workup previously for chest discomfort, which was negative. Feeling better today. She does report that she may have had similar more mild episodes in recent months. She reports that her bowel habits have been fairly regular recently. She has had intentional weight loss, which she is pleased with, secondary to diet  REVIEW OF SYSTEMS:  All non-GI ROS negative upon review  Past Medical History  Diagnosis Date  . PVC (premature ventricular contraction)   . Hyperchloremia   . Breast CA     right - radiation & surgery  . GERD (gastroesophageal reflux disease)   . Diverticulosis   . Gastroparesis     mild  . Hypertension   . Hyperlipidemia   . History of nuclear stress test 12/2011    bruce protocol; no evidence of ischemia, EF 79%, normal pattern of perfusion  . Family history of heart disease   . MVP  (mitral valve prolapse)     last echo 07/1997 - normal LV size/function, mild MVP, mild MR    Past Surgical History  Procedure Laterality Date  . Mastectomy Right 2006    right lumpectomy  . Tonsillectomy  1969  . Appendectomy  01/2002  . Abdominal hysterectomy  03/2003  . Cardiac catheterization  10/13/2009    no significant fixed obstructive CAD, normral ramus intermediate/circumflex/RCA; normal LV function (Dr. Corky Downs)    Social History Mahsa Hanser Bernabei  reports that she has never smoked. She has never used smokeless tobacco. She reports that she drinks alcohol. She reports that she does not use illicit drugs.  family history includes Alzheimer's disease in her mother; Asthma in her child; Breast cancer in her maternal aunt and paternal grandmother; Diabetes in her brother and maternal grandmother; Emphysema in her maternal grandfather; Heart disease in her paternal grandfather; Heart disease (age of onset: 64) in her brother; Heart failure in her father and maternal grandmother; Hyperlipidemia in her sister; Hypertension in her brother and father.  Allergies  Allergen Reactions  . Amoxicillin     REACTION: rash  . Chocolate   . Crestor [Rosuvastatin]     myalgias  . Erythromycin     REACTION: Nausea       PHYSICAL EXAMINATION: Vital signs: BP 106/78  Pulse 78  Ht 5\' 2"  (1.575 m)  Wt 168 lb (76.204 kg)  BMI 30.72 kg/m2  SpO2 98% General: Well-developed, well-nourished, no acute distress HEENT: Sclerae are anicteric, conjunctiva pink. Oral mucosa intact Lungs: Clear Heart: Regular Abdomen: soft, superficial  tenderness in the right upper quadrant, nondistended, no obvious ascites, no peritoneal signs, normal bowel sounds. No organomegaly. Extremities: No edema Psychiatric: alert and oriented x3. Cooperative   ASSESSMENT:  #1. Acute right upper quadrant pain. Etiology unclear. Has some features of biliary colic, the negative workup previously #2. GERD. No active  classic symptoms on PPI. Negative EGD April 2014 #3. Colonoscopy April 2014 with diverticulosis only #4. General medical problems. Stable  PLAN:  #1. Obtain LFTs, amylase, lipase #2. Abdominal ultrasound rule out interval development of gallstones #3. Prescribe Levsin sublingual as needed for recurrent pain #4. If workup negative and severe pain returns, consider repeating advanced imaging such as CT #5. Routine repeat screening colonoscopy around 2024

## 2013-12-06 ENCOUNTER — Ambulatory Visit (HOSPITAL_COMMUNITY)
Admission: RE | Admit: 2013-12-06 | Discharge: 2013-12-06 | Disposition: A | Discharge status: Home / Self Care | Payer: BC Managed Care – PPO | Source: Ambulatory Visit | Attending: Internal Medicine | Admitting: Internal Medicine

## 2013-12-06 DIAGNOSIS — R1011 Right upper quadrant pain: Secondary | ICD-10-CM

## 2014-01-08 ENCOUNTER — Ambulatory Visit: Payer: BC Managed Care – PPO | Admitting: Internal Medicine

## 2014-03-11 ENCOUNTER — Other Ambulatory Visit: Payer: Self-pay | Admitting: Internal Medicine

## 2014-03-15 ENCOUNTER — Encounter: Payer: Self-pay | Admitting: *Deleted

## 2014-03-15 LAB — NMR LIPOPROFILE WITH LIPIDS
Cholesterol, Total: 151 mg/dL (ref 100–199)
HDL Particle Number: 28.5 umol/L — ABNORMAL LOW (ref >=30.5)
HDL Size: 8.4 nm — ABNORMAL LOW (ref >=9.2)
HDL-C: 33 mg/dL — ABNORMAL LOW (ref >39)
LDL (calc): 80 mg/dL (ref 0–99)
LDL Particle Number: 1480 nmol/L — ABNORMAL HIGH (ref <1000)
LDL Size: 20.6 nm (ref >=20.8)
LP-IR Score: 90 — ABNORMAL HIGH (ref <=45)
Large HDL-P: <1.3 umol/L — ABNORMAL LOW (ref >=4.8)
Large VLDL-P: 9.7 nmol/L — ABNORMAL HIGH (ref <=2.7)
Small LDL Particle Number: 819 nmol/L — ABNORMAL HIGH (ref <=527)
Triglycerides: 191 mg/dL — ABNORMAL HIGH (ref 0–149)
VLDL Size: 58.8 nm — ABNORMAL HIGH (ref <=46.6)

## 2014-03-18 ENCOUNTER — Encounter: Payer: Self-pay | Admitting: Internal Medicine

## 2014-03-18 ENCOUNTER — Ambulatory Visit (INDEPENDENT_AMBULATORY_CARE_PROVIDER_SITE_OTHER): Payer: BC Managed Care – PPO | Admitting: Internal Medicine

## 2014-03-18 VITALS — BP 126/88 | HR 61 | Ht 62.0 in | Wt 168.1 lb

## 2014-03-18 DIAGNOSIS — E785 Hyperlipidemia, unspecified: Secondary | ICD-10-CM

## 2014-03-18 DIAGNOSIS — I493 Ventricular premature depolarization: Secondary | ICD-10-CM

## 2014-03-18 DIAGNOSIS — J329 Chronic sinusitis, unspecified: Secondary | ICD-10-CM | POA: Insufficient documentation

## 2014-03-18 DIAGNOSIS — E119 Type 2 diabetes mellitus without complications: Secondary | ICD-10-CM

## 2014-03-18 DIAGNOSIS — J0101 Acute recurrent maxillary sinusitis: Secondary | ICD-10-CM

## 2014-03-18 MED ORDER — HYDROCHLOROTHIAZIDE 25 MG PO TABS: 25.0000 mg | ORAL_TABLET | Freq: Every day | ORAL | Status: DC

## 2014-03-18 NOTE — Progress Notes (Signed)
OFFICE NOTE  Chief Complaint:  Routine follow-up  Primary Care Physician: Tawanna Solo, MD  HPI:  [name] is a 58 year old female previously followed by Dr. Rex Kras with a history of lower extremity edema recently and chest discomfort in the spring which she underwent a stress test for that was negative for ischemia with a preserved EF of 71%. She has had no further episodes of chest pain, and her edema has all but resolved, however, she stayed on the hydrochlorothiazide 25 mg daily. She also has hypertension which has been well controlled and a history of breast cancer but is now cancer free. Recently she had a fall and fractured her right humerus and has not been as active from that but has since started to get back to some more activity. 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 She recently had laboratory work which shows a well controlled lipid profile, total cholesterol 185, triglycerides 286, HDL 36 and LDL 92. This is on fenofibrate. She has had a history of elevated triglycerides in the past, but is nondiabetic.  She was doing very well when I saw her last week and recommended a dental holiday. She was not having any significant palpitations or PVCs that she mentioned. Less than a week after her office visit, she reported a day where she felt "terrible all day". She reported having numerous PVCs throughout the day and she says that that completely "zaps her energy". The next day she felt very fatigued and today, 2 days later she still feels quite weakened. Her EKG in the office today shows no evidence of PVCs.  This is apparently been an ongoing problem for years for her, was advanced coming paroxysmally and being associated with extreme fatigue.  I had referred her to see Dr. Cristopher Peru with cardiac electrophysiology. He recommended starting her on low-dose flecainide. Since starting that she has reported feeling much better. She  was placed on a longer monitor which showed essentially sinus rhythm and 1 atrial couplet which she noted a skipped beat otherwise no paroxysmal atrial fibrillation, PVC's, nonsustained VT or other arrhythmias were noted. Dr. Lovena Le recommended weaning off atenolol and she is currently on flecainide 75 mg twice daily.  [name] returns today for followup. She reports her palpitations are essentially gone and flecainide. She denies any chest pain. Unfortunately she's had recurrent sinusitis and is currently on doxycycline. She's had issues with her sinuses for a long time and now is also having problems with her years. I recommended she see an ear nose and throat doctor. She has had improvement in her lipid profile on current medications. Her LDL particle number is down to 1480, from 1824. Calculated LDL is down to 80 from 116 and triglycerides are mildly elevated still at 191.  PMHx:  Past Medical History  Diagnosis Date  . PVC (premature ventricular contraction)   . Hyperchloremia   . Breast CA     right - radiation & surgery  . GERD (gastroesophageal reflux disease)   . Diverticulosis   . Gastroparesis     mild  . Hypertension   . Hyperlipidemia   . History of nuclear stress test 12/2011    bruce protocol; no evidence of ischemia, EF 79%, normal pattern of perfusion  . Family history of heart disease   . MVP (mitral valve prolapse)     last echo 07/1997 - normal LV size/function, mild MVP, mild MR  Past Surgical History  Procedure Laterality Date  . Mastectomy Right 2006    right lumpectomy  . Tonsillectomy  1969  . Appendectomy  01/2002  . Abdominal hysterectomy  03/2003  . Cardiac catheterization  10/13/2009    no significant fixed obstructive CAD, normral ramus intermediate/circumflex/RCA; normal LV function (Dr. Corky Downs)    FAMHx:  Family History  Problem Relation Age of Onset  . Alzheimer's disease Mother   . Heart failure Father   . Hypertension Father     also  murmur, hyperlipidemia, MI, CABG age 44  . Diabetes Brother   . Heart disease Brother 50    MI at 60  . Hypertension Brother   . Hyperlipidemia Sister   . Breast cancer Maternal Aunt     PVCs  . Breast cancer Paternal Grandmother   . Diabetes Maternal Grandmother   . Heart failure Maternal Grandmother   . Emphysema Maternal Grandfather   . Heart disease Paternal Grandfather   . Asthma Child     x2    SOCHx:   reports that she has never smoked. She has never used smokeless tobacco. She reports that she drinks alcohol. She reports that she does not use illicit drugs.  ALLERGIES:  Allergies  Allergen Reactions  . Amoxicillin     REACTION: rash  . Chocolate   . Crestor [Rosuvastatin]     myalgias  . Erythromycin     REACTION: Nausea    ROS: A comprehensive review of systems was negative except for: sinusitis  HOME MEDS: Current Outpatient Prescriptions  Medication Sig Dispense Refill  . aspirin EC 81 MG tablet Take 81 mg by mouth daily.        . Calcium Carbonate-Vitamin D (CALCIUM + D PO) Take 1 tablet by mouth at bedtime.       . cetirizine (ZYRTEC) 10 MG tablet Take 10 mg by mouth daily.        Marland Kitchen doxycycline (VIBRAMYCIN) 100 MG capsule Take 100 mg by mouth 2 (two) times daily.      Marland Kitchen esomeprazole (NEXIUM) 40 MG capsule Take 40 mg by mouth daily.       Marland Kitchen ezetimibe (ZETIA) 10 MG tablet Take 1 tablet (10 mg total) by mouth daily.  28 tablet  0  . fenofibrate (TRICOR) 145 MG tablet Take 1 tablet (145 mg total) by mouth daily.  30 tablet  6  . fish oil-omega-3 fatty acids 1000 MG capsule Take 1 g by mouth 2 (two) times daily.        . flecainide (TAMBOCOR) 50 MG tablet TAKE 1&1/2 TABLETS TWICE DAILY.  90 tablet  3  . glucosamine-chondroitin (COSAMIN DS) 500-400 MG tablet Take 1 tablet by mouth 2 (two) times daily.        . hydrochlorothiazide (HYDRODIURIL) 25 MG tablet Take 1 tablet (25 mg total) by mouth daily.  30 tablet  11  . hyoscyamine (LEVSIN SL) 0.125 MG SL tablet  Take 1-2 tablets sublingually every 4 hours as needed for abdominal pain  30 tablet  0  . mometasone (NASONEX) 50 MCG/ACT nasal spray Place 1 spray into the nose daily.       Marland Kitchen PARoxetine (PAXIL-CR) 12.5 MG 24 hr tablet Take 12.5 mg by mouth every morning.        . simvastatin (ZOCOR) 20 MG tablet Take 20 mg by mouth daily.      Marland Kitchen zolpidem (AMBIEN) 10 MG tablet Take 10 mg by mouth at bedtime as needed for  sleep.       No current facility-administered medications for this visit.    LABS/IMAGING: No results found for this or any previous visit (from the past 48 hour(s)). No results found.  VITALS: BP 126/88  Pulse 61  Ht 5\' 2"  (1.575 m)  Wt 168 lb 1.6 oz (76.25 kg)  BMI 30.74 kg/m2  EXAM: General appearance: alert and no distress Neck: no adenopathy, no carotid bruit, no JVD, supple, symmetrical, trachea midline and thyroid not enlarged, symmetric, no tenderness/mass/nodules Heart: regular rate and rhythm, S1, S2 normal, no murmur, click, rub or gallop Extremities: extremities normal, atraumatic, no cyanosis or edema Pulses: 2+ and symmetric  EKG: Normal sinus rhythm at 61  ASSESSMENT: 1. PVCs-markedly improved on flecainde. 2. Hyperlipidemia-taking Zocor 20 mg QOD 3. Recurrent sinusitis  PLAN: 1.   [name] has had marked improvement in her palpitations on flecainide. She is now taking Zocor every other day which she says is as much as she can tolerate secondary to myalgias. This is cause an improvement in her cholesterol profile and she is near where I would like her. She needs to continue to work on her triglycerides. She has recurrent sinus problems and has required antibiotics several times a year. I concerned about long-term resistance. It may be helpful for her to be seen by an ENT for further evaluation of her recurrent problems. I provided her with the name of Dr. Benjamine Mola with ENT - she may need a referral.  Plan repeat lipid NMR in 6 months.  Pixie Casino, MD,  [facility] Attending Cardiologist [facility]  Migel Hannis C 03/18/2014, 2:24 PM

## 2014-03-18 NOTE — Patient Instructions (Signed)
Your physician wants you to follow-up in: 6 months with Dr. Debara Pickett. You will receive a reminder letter in the mail two months in advance. If you don't receive a letter, please call our office to schedule the follow-up appointment.  ENT physician - Su Raynelle Bring, MD [phone]

## 2014-03-26 ENCOUNTER — Other Ambulatory Visit: Payer: Self-pay | Admitting: Internal Medicine

## 2014-04-18 ENCOUNTER — Ambulatory Visit
Admission: RE | Admit: 2014-04-18 | Discharge: 2014-04-18 | Disposition: A | Discharge status: Home / Self Care | Payer: BC Managed Care – PPO | Source: Ambulatory Visit | Attending: Otolaryngology | Admitting: Otolaryngology

## 2014-04-18 ENCOUNTER — Other Ambulatory Visit: Payer: Self-pay | Admitting: Otolaryngology

## 2014-04-18 DIAGNOSIS — R519 Headache, unspecified: Secondary | ICD-10-CM

## 2014-04-18 DIAGNOSIS — G8929 Other chronic pain: Secondary | ICD-10-CM

## 2014-04-18 DIAGNOSIS — R51 Headache: Principal | ICD-10-CM

## 2014-05-20 ENCOUNTER — Other Ambulatory Visit: Payer: Self-pay | Admitting: *Deleted

## 2014-05-20 ENCOUNTER — Other Ambulatory Visit: Payer: Self-pay

## 2014-05-20 MED ORDER — FENOFIBRATE 145 MG PO TABS: 145.0000 mg | ORAL_TABLET | Freq: Every day | ORAL | Status: DC

## 2014-05-20 MED ORDER — EZETIMIBE 10 MG PO TABS: 10.0000 mg | ORAL_TABLET | Freq: Every day | ORAL | Status: DC

## 2014-05-20 NOTE — Telephone Encounter (Signed)
Refilled electronically 

## 2014-05-22 ENCOUNTER — Other Ambulatory Visit: Payer: Self-pay | Admitting: *Deleted

## 2014-05-22 MED ORDER — EZETIMIBE 10 MG PO TABS: 10.0000 mg | ORAL_TABLET | Freq: Every day | ORAL | Status: DC

## 2014-05-22 NOTE — Telephone Encounter (Signed)
Rx was sent to pharmacy electronically. 

## 2014-07-09 ENCOUNTER — Other Ambulatory Visit: Payer: Self-pay | Admitting: Internal Medicine

## 2014-07-12 ENCOUNTER — Other Ambulatory Visit: Payer: Self-pay | Admitting: Internal Medicine

## 2014-07-15 ENCOUNTER — Other Ambulatory Visit: Payer: Self-pay

## 2014-07-15 MED ORDER — FLECAINIDE ACETATE 50 MG PO TABS: ORAL_TABLET | ORAL | Status: DC

## 2014-09-17 ENCOUNTER — Other Ambulatory Visit: Payer: Self-pay | Admitting: Internal Medicine

## 2014-09-19 LAB — NMR LIPOPROFILE WITH LIPIDS
Cholesterol, Total: 171 mg/dL (ref 100–199)
HDL Particle Number: 30.7 umol/L (ref >=30.5)
HDL Size: 8.4 nm — ABNORMAL LOW (ref >=9.2)
HDL-C: 36 mg/dL — ABNORMAL LOW (ref >39)
LDL (calc): 100 mg/dL — ABNORMAL HIGH (ref 0–99)
LDL Particle Number: 1754 nmol/L — ABNORMAL HIGH (ref <1000)
LDL Size: 20.3 nm (ref >=20.8)
LP-IR Score: 95 — ABNORMAL HIGH (ref <=45)
Large HDL-P: 1.8 umol/L — ABNORMAL LOW (ref >=4.8)
Large VLDL-P: 9.3 nmol/L — ABNORMAL HIGH (ref <=2.7)
Small LDL Particle Number: 1083 nmol/L — ABNORMAL HIGH (ref <=527)
Triglycerides: 177 mg/dL — ABNORMAL HIGH (ref 0–149)
VLDL Size: 61.9 nm — ABNORMAL HIGH (ref <=46.6)

## 2014-09-23 ENCOUNTER — Encounter: Payer: Self-pay | Admitting: Internal Medicine

## 2014-09-23 ENCOUNTER — Ambulatory Visit (INDEPENDENT_AMBULATORY_CARE_PROVIDER_SITE_OTHER): Payer: BC Managed Care – PPO | Admitting: Internal Medicine

## 2014-09-23 VITALS — BP 118/80 | HR 66 | Ht 62.0 in | Wt 171.8 lb

## 2014-09-23 DIAGNOSIS — Z79899 Other long term (current) drug therapy: Secondary | ICD-10-CM | POA: Diagnosis not present

## 2014-09-23 DIAGNOSIS — I493 Ventricular premature depolarization: Secondary | ICD-10-CM

## 2014-09-23 DIAGNOSIS — Z5181 Encounter for therapeutic drug level monitoring: Secondary | ICD-10-CM | POA: Diagnosis not present

## 2014-09-23 DIAGNOSIS — I4891 Unspecified atrial fibrillation: Secondary | ICD-10-CM | POA: Insufficient documentation

## 2014-09-23 NOTE — Progress Notes (Signed)
OFFICE NOTE  Chief Complaint:  Routine follow-up  Primary Care Physician: Tawanna Solo, MD  HPI:  [name] is a 59 year old female previously followed by Dr. Rex Kras with a history of lower extremity edema recently and chest discomfort in the spring which she underwent a stress test for that was negative for ischemia with a preserved EF of 71%. She has had no further episodes of chest pain, and her edema has all but resolved, however, she stayed on the hydrochlorothiazide 25 mg daily. She also has hypertension which has been well controlled and a history of breast cancer but is now cancer free. Recently she had a fall and fractured her right humerus and has not been as active from that but has since started to get back to some more activity. 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 She recently had laboratory work which shows a well controlled lipid profile, total cholesterol 185, triglycerides 286, HDL 36 and LDL 92. This is on fenofibrate. She has had a history of elevated triglycerides in the past, but is nondiabetic.  She was doing very well when I saw her last week and recommended a dental holiday. She was not having any significant palpitations or PVCs that she mentioned. Less than a week after her office visit, she reported a day where she felt "terrible all day". She reported having numerous PVCs throughout the day and she says that that completely "zaps her energy". The next day she felt very fatigued and today, 2 days later she still feels quite weakened. Her EKG in the office today shows no evidence of PVCs.  This is apparently been an ongoing problem for years for her, was advanced coming paroxysmally and being associated with extreme fatigue.  I had referred her to see Dr. Cristopher Peru with cardiac electrophysiology. He recommended starting her on low-dose flecainide. Since starting that she has reported feeling much better. She  was placed on a longer monitor which showed essentially sinus rhythm and 1 atrial couplet which she noted a skipped beat otherwise no paroxysmal atrial fibrillation, PVC's, nonsustained VT or other arrhythmias were noted. Dr. Lovena Le recommended weaning off atenolol and she is currently on flecainide 75 mg twice daily.  [name] returns today for followup. She reports her palpitations are essentially gone and flecainide. She denies any chest pain. Unfortunately she's had recurrent sinusitis and is currently on doxycycline. She's had issues with her sinuses for a long time and now is also having problems with her years. I recommended she see an ear nose and throat doctor. She has had improvement in her lipid profile on current medications. Her LDL particle number is down to 1480, from 1824. Calculated LDL is down to 80 from 116 and triglycerides are mildly elevated still at 191.  [name] returns today for follow-up of her palpitations. She reports a been generally well controlled on flecainide. She is currently on senna 5 mg twice daily. Her last stress test was in 2013. She denies any chest pain or worsening shortness of breath. On aspirin for anticoagulation, given her young age and infrequency of symptoms.  PMHx:  Past Medical History  Diagnosis Date  . PVC (premature ventricular contraction)   . Hyperchloremia   . Breast CA     right - radiation & surgery  . GERD (gastroesophageal reflux disease)   . Diverticulosis   . Gastroparesis     mild  . Hypertension   .  Hyperlipidemia   . History of nuclear stress test 12/2011    bruce protocol; no evidence of ischemia, EF 79%, normal pattern of perfusion  . Family history of heart disease   . MVP (mitral valve prolapse)     last echo 07/1997 - normal LV size/function, mild MVP, mild MR    Past Surgical History  Procedure Laterality Date  . Mastectomy Right 2006    right lumpectomy  . Tonsillectomy  1969  . Appendectomy  01/2002  .  Abdominal hysterectomy  03/2003  . Cardiac catheterization  10/13/2009    no significant fixed obstructive CAD, normral ramus intermediate/circumflex/RCA; normal LV function (Dr. Corky Downs)    FAMHx:  Family History  Problem Relation Age of Onset  . Alzheimer's disease Mother   . Heart failure Father   . Hypertension Father     also murmur, hyperlipidemia, MI, CABG age 50  . Diabetes Brother   . Heart disease Brother 71    MI at 4  . Hypertension Brother   . Hyperlipidemia Sister   . Breast cancer Maternal Aunt     PVCs  . Breast cancer Paternal Grandmother   . Diabetes Maternal Grandmother   . Heart failure Maternal Grandmother   . Emphysema Maternal Grandfather   . Heart disease Paternal Grandfather   . Asthma Child     x2    SOCHx:   reports that she has never smoked. She has never used smokeless tobacco. She reports that she drinks alcohol. She reports that she does not use illicit drugs.  ALLERGIES:  Allergies  Allergen Reactions  . Amoxicillin     REACTION: rash  . Chocolate   . Crestor [Rosuvastatin]     myalgias  . Erythromycin     REACTION: Nausea    ROS: A comprehensive review of systems was negative except for: sinusitis  HOME MEDS: Current Outpatient Prescriptions  Medication Sig Dispense Refill  . aspirin EC 81 MG tablet Take 81 mg by mouth daily.      . Calcium Carbonate-Vitamin D (CALCIUM + D PO) Take 1 tablet by mouth at bedtime.     . cetirizine (ZYRTEC) 10 MG tablet Take 10 mg by mouth daily.      Marland Kitchen esomeprazole (NEXIUM) 40 MG capsule Take 40 mg by mouth daily.     Marland Kitchen ezetimibe (ZETIA) 10 MG tablet Take 1 tablet (10 mg total) by mouth daily. 30 tablet 10  . fenofibrate (TRICOR) 145 MG tablet Take 1 tablet (145 mg total) by mouth daily. 30 tablet 6  . fish oil-omega-3 fatty acids 1000 MG capsule Take 1 g by mouth 2 (two) times daily.      . flecainide (TAMBOCOR) 50 MG tablet TAKE 1&1/2 TABLETS TWICE DAILY. 90 tablet 3  . glucosamine-chondroitin  (COSAMIN DS) 500-400 MG tablet Take 1 tablet by mouth 2 (two) times daily.      . hydrochlorothiazide (HYDRODIURIL) 25 MG tablet Take 1 tablet (25 mg total) by mouth daily. 30 tablet 11  . hyoscyamine (LEVSIN SL) 0.125 MG SL tablet PLACE 1-2 TABLETS UNDER TONGUE EVERY FOUR HOURS AS NEEDED FOR ABDOMINAL PAIN. 30 tablet 1  . mometasone (NASONEX) 50 MCG/ACT nasal spray Place 1 spray into the nose daily.     . Multiple Vitamins-Minerals (MULTIPLE VITAMINS/WOMENS PO) Take by mouth daily.    Marland Kitchen PARoxetine (PAXIL-CR) 12.5 MG 24 hr tablet Take 12.5 mg by mouth every morning.      Marland Kitchen Phenazopyridine HCl (AZO TABS PO) Take by  mouth daily.    . simvastatin (ZOCOR) 20 MG tablet Take 20 mg by mouth daily.     No current facility-administered medications for this visit.    LABS/IMAGING: No results found for this or any previous visit (from the past 48 hour(s)). No results found.  VITALS: BP 118/80 mmHg  Pulse 66  Ht 5\' 2"  (1.575 m)  Wt 171 lb 12.8 oz (77.928 kg)  BMI 31.41 kg/m2  EXAM: General appearance: alert and no distress Neck: no adenopathy, no carotid bruit, no JVD, supple, symmetrical, trachea midline and thyroid not enlarged, symmetric, no tenderness/mass/nodules Heart: regular rate and rhythm, S1, S2 normal, no murmur, click, rub or gallop Extremities: extremities normal, atraumatic, no cyanosis or edema Pulses: 2+ and symmetric  EKG: Normal sinus rhythm at 61  ASSESSMENT: 1. PVCs-markedly improved on flecainde. 2. Hyperlipidemia-taking Zocor 20 mg QOD 3. Recurrent sinusitis  PLAN: 1.   [name] has had marked improvement in her palpitations on flecainide. Her CHADSVASC score is 0.5. She feels that her heart rate is well-controlled. It's been 3 years since her last stress test. I like her to have an exercise treadmill stress test for ongoing evaluation of ischemia in the setting of Class IC antiarrhythmic therapy.  Plan follow-up in 6 months or sooner as necessary.  Pixie Casino, MD, [facility] Attending Cardiologist [facility]  HILTY,Kenneth C 09/23/2014, 4:54 PM

## 2014-09-23 NOTE — Patient Instructions (Signed)
Your physician has requested that you have an exercise tolerance test. For further information please visit [email or web address]. Please also follow instruction sheet, as given.  Your physician wants you to follow-up in: 6 months with Dr. Debara Pickett. You will receive a reminder letter in the mail two months in advance. If you don't receive a letter, please call our office to schedule the follow-up appointment.

## 2014-09-24 ENCOUNTER — Other Ambulatory Visit: Payer: Self-pay | Admitting: Obstetrics and Gynecology

## 2014-10-07 ENCOUNTER — Telehealth: Payer: Self-pay | Admitting: Internal Medicine

## 2014-10-07 DIAGNOSIS — E785 Hyperlipidemia, unspecified: Secondary | ICD-10-CM

## 2014-10-07 DIAGNOSIS — Z79899 Other long term (current) drug therapy: Secondary | ICD-10-CM

## 2014-10-07 NOTE — Telephone Encounter (Signed)
Pt called in stating that her insurance company denied some labs that she has had done before. She is confused and would like to speak with Dr.Hilty about this. Please f/u with pt  Thanks

## 2014-10-07 NOTE — Telephone Encounter (Signed)
Spoke with patient - her insurance is denying coverage of her NMR (?). She called her insurance company and informed them these are labs she has routinely for med management. She will fax over info regarding this and appeal info.   When patient was seen on 4/11, labs were not in EPIC and no outstanding NMR order was in EPIC. [facility] lab was contacted that day to have labs faxed to our office, but they were not. [employer] customer service and requested for labs to be faxed.

## 2014-10-09 NOTE — Telephone Encounter (Signed)
Requisition for NMR lipoprofile faxed to office from [facility] - ordered by Dr. Debara Pickett - lab was cancelled out of EPIC system due to 180 day limit of the order staying current. Will attempt to contact [employer] for appeal information

## 2014-10-14 NOTE — Telephone Encounter (Signed)
Yes .. 6 months with fasting labs prior. Dr. Lemmie Evens

## 2014-10-14 NOTE — Telephone Encounter (Signed)
Called [employer] and submitted claim for lab work on 09/17/14 Ref: [id number]  Claim will be processed and determined in 30-45 business days.  Patient aware.   Patient notified of lab results and will increase simvastatin to QD.   Patient to follow up in 6 months - have fasting labs prior?

## 2014-10-16 NOTE — Telephone Encounter (Signed)
Fasting labs ordered and will be mailed to patient to have done prior to 6 month ROV

## 2014-10-18 ENCOUNTER — Telehealth (HOSPITAL_COMMUNITY): Payer: Self-pay

## 2014-10-18 NOTE — Telephone Encounter (Signed)
Encounter complete. 

## 2014-10-22 ENCOUNTER — Ambulatory Visit (HOSPITAL_COMMUNITY)
Admission: RE | Admit: 2014-10-22 | Discharge: 2014-10-22 | Disposition: A | Discharge status: Home / Self Care | Payer: BC Managed Care – PPO | Source: Ambulatory Visit | Attending: Internal Medicine | Admitting: Internal Medicine

## 2014-10-22 ENCOUNTER — Encounter (HOSPITAL_COMMUNITY): Payer: BC Managed Care – PPO

## 2014-10-22 DIAGNOSIS — Z5181 Encounter for therapeutic drug level monitoring: Secondary | ICD-10-CM | POA: Diagnosis not present

## 2014-10-22 DIAGNOSIS — Z79899 Other long term (current) drug therapy: Secondary | ICD-10-CM | POA: Diagnosis not present

## 2014-10-22 DIAGNOSIS — I4891 Unspecified atrial fibrillation: Secondary | ICD-10-CM | POA: Diagnosis not present

## 2014-10-23 ENCOUNTER — Encounter: Payer: Self-pay | Admitting: Internal Medicine

## 2014-10-23 LAB — EXERCISE TOLERANCE TEST
Estimated workload: 8.5 METS
Exercise duration (min): 7 min
MPHR: 160 {beats}/min
Peak HR: 142 {beats}/min
Percent HR: 91 %
Percent of predicted max HR: 88 %
RPE: 16891
Rest HR: 71 {beats}/min
Stage 1 DBP: 86 mmHg
Stage 1 Grade: 0 %
Stage 1 HR: 73 {beats}/min
Stage 1 SBP: 120 mmHg
Stage 1 Speed: 0 mph
Stage 2 Grade: 0 %
Stage 2 HR: 71 {beats}/min
Stage 2 Speed: 1 mph
Stage 3 Grade: 0.1 %
Stage 3 HR: 72 {beats}/min
Stage 3 Speed: 1 mph
Stage 4 DBP: 73 mmHg
Stage 4 Grade: 10 %
Stage 4 HR: 100 {beats}/min
Stage 4 SBP: 139 mmHg
Stage 4 Speed: 1.7 mph
Stage 5 DBP: 95 mmHg
Stage 5 Grade: 12 %
Stage 5 HR: 133 {beats}/min
Stage 5 SBP: 127 mmHg
Stage 5 Speed: 2.5 mph
Stage 6 Grade: 14 %
Stage 6 HR: 142 {beats}/min
Stage 6 Speed: 3.4 mph
Stage 7 DBP: 92 mmHg
Stage 7 Grade: 0 %
Stage 7 HR: 127 {beats}/min
Stage 7 SBP: 111 mmHg
Stage 7 Speed: 0 mph
Stage 8 DBP: 83 mmHg
Stage 8 Grade: 0 %
Stage 8 HR: 87 {beats}/min
Stage 8 SBP: 139 mmHg
Stage 8 Speed: 0 mph

## 2014-10-24 ENCOUNTER — Encounter (HOSPITAL_COMMUNITY): Payer: Self-pay | Admitting: *Deleted

## 2014-11-05 ENCOUNTER — Other Ambulatory Visit: Payer: Self-pay

## 2014-11-05 MED ORDER — SIMVASTATIN 20 MG PO TABS: 20.0000 mg | ORAL_TABLET | Freq: Every day | ORAL | Status: DC

## 2014-11-05 NOTE — Telephone Encounter (Signed)
Rx(s) sent to pharmacy electronically.  

## 2014-11-15 ENCOUNTER — Other Ambulatory Visit: Payer: Self-pay | Admitting: Internal Medicine

## 2014-11-15 NOTE — Telephone Encounter (Signed)
Rx(s) sent to pharmacy electronically.  

## 2014-11-21 ENCOUNTER — Ambulatory Visit (INDEPENDENT_AMBULATORY_CARE_PROVIDER_SITE_OTHER): Payer: BC Managed Care – PPO | Admitting: Internal Medicine

## 2014-11-21 ENCOUNTER — Encounter: Payer: Self-pay | Admitting: Internal Medicine

## 2014-11-21 VITALS — BP 129/82 | HR 68 | Ht 62.0 in | Wt 169.0 lb

## 2014-11-21 DIAGNOSIS — J0110 Acute frontal sinusitis, unspecified: Secondary | ICD-10-CM

## 2014-11-21 DIAGNOSIS — I493 Ventricular premature depolarization: Secondary | ICD-10-CM | POA: Diagnosis not present

## 2014-11-21 NOTE — Progress Notes (Signed)
HPI [name] returns today for followup. She is a 59 year old woman with a history of symptomatic palpitations, hypertension, who I saw over 6 months ago. She has done well and her palpitations are markedly improved. She has tolerated her flecainide nicely. She denies syncope, chest pain, or shortness of breath. No edema. Allergies  Allergen Reactions  . Amoxicillin     REACTION: rash  . Chocolate   . Crestor [Rosuvastatin]     myalgias  . Erythromycin     REACTION: Nausea     Current Outpatient Prescriptions  Medication Sig Dispense Refill  . aspirin EC 81 MG tablet Take 81 mg by mouth daily.      . Calcium Carbonate-Vitamin D (CALCIUM + D PO) Take 1 tablet by mouth at bedtime.     . cetirizine (ZYRTEC) 10 MG tablet Take 10 mg by mouth daily.      Marland Kitchen esomeprazole (NEXIUM) 40 MG capsule Take 40 mg by mouth daily.     Marland Kitchen ezetimibe (ZETIA) 10 MG tablet Take 1 tablet (10 mg total) by mouth daily. 30 tablet 10  . fenofibrate (TRICOR) 145 MG tablet Take 1 tablet (145 mg total) by mouth daily. 30 tablet 6  . fish oil-omega-3 fatty acids 1000 MG capsule Take 1 g by mouth 2 (two) times daily.      . flecainide (TAMBOCOR) 50 MG tablet Take 1.5 tablets (75 mg total) by mouth 2 (two) times daily. 75 tablet 5  . hydrochlorothiazide (HYDRODIURIL) 25 MG tablet Take 1 tablet (25 mg total) by mouth daily. 30 tablet 11  . hyoscyamine (LEVSIN SL) 0.125 MG SL tablet PLACE 1-2 TABLETS UNDER TONGUE EVERY FOUR HOURS AS NEEDED FOR ABDOMINAL PAIN. 30 tablet 1  . mometasone (NASONEX) 50 MCG/ACT nasal spray Place 1 spray into the nose daily.     . Multiple Vitamins-Minerals (MULTIPLE VITAMINS/WOMENS PO) Take by mouth daily.    Marland Kitchen PARoxetine (PAXIL-CR) 12.5 MG 24 hr tablet Take 12.5 mg by mouth every morning.      . simvastatin (ZOCOR) 20 MG tablet Take 1 tablet (20 mg total) by mouth daily. 30 tablet 11   No current facility-administered medications for this visit.     Past Medical History   Diagnosis Date  . PVC (premature ventricular contraction)   . Hyperchloremia   . Breast CA     right - radiation & surgery  . GERD (gastroesophageal reflux disease)   . Diverticulosis   . Gastroparesis     mild  . Hypertension   . Hyperlipidemia   . History of nuclear stress test 12/2011    bruce protocol; no evidence of ischemia, EF 79%, normal pattern of perfusion  . Family history of heart disease   . MVP (mitral valve prolapse)     last echo 07/1997 - normal LV size/function, mild MVP, mild MR    ROS:   All systems reviewed and negative except as noted in the HPI.   Past Surgical History  Procedure Laterality Date  . Mastectomy Right 2006    right lumpectomy  . Tonsillectomy  1969  . Appendectomy  01/2002  . Abdominal hysterectomy  03/2003  . Cardiac catheterization  10/13/2009    no significant fixed obstructive CAD, normral ramus intermediate/circumflex/RCA; normal LV function (Dr. Corky Downs)     Family History  Problem Relation Age of Onset  . Alzheimer's disease Mother   . Heart failure Father   . Hypertension Father     also  murmur, hyperlipidemia, MI, CABG age 80  . Diabetes Brother   . Heart disease Brother 90    MI at 40  . Hypertension Brother   . Hyperlipidemia Sister   . Breast cancer Maternal Aunt     PVCs  . Breast cancer Paternal Grandmother   . Diabetes Maternal Grandmother   . Heart failure Maternal Grandmother   . Emphysema Maternal Grandfather   . Heart disease Paternal Grandfather   . Asthma Child     x2     History   Social History  . Marital Status: Married    Spouse Name: N/A  . Number of Children: 2  . Years of Education: bachelors   Occupational History  .  [employer]   Social History Main Topics  . Smoking status: Never Smoker   . Smokeless tobacco: Never Used  . Alcohol Use: Yes  . Drug Use: No  . Sexual Activity: Not on file   Other Topics Concern  . Not on file   Social History Narrative     BP 129/82 mmHg   Pulse 68  Ht 5\' 2"  (1.575 m)  Wt 169 lb (76.658 kg)  BMI 30.90 kg/m2  Physical Exam:  Well appearing 59 year old woman, NAD HEENT: Unremarkable Neck:  6 cm JVD, no thyromegally Back:  No CVA tenderness Lungs:  Clear with no wheezes, rales, or rhonchi. HEART:  Regular rate rhythm, no murmurs, no rubs, no clicks Abd:  soft, positive bowel sounds, no organomegally, no rebound, no guarding Ext:  2 plus pulses, no edema, no cyanosis, no clubbing Skin:  No rashes no nodules Neuro:  CN II through XII intact, motor grossly intact   ECG - NSR with a narrow QRS  Assess/Plan:

## 2014-11-21 NOTE — Assessment & Plan Note (Signed)
She has had almost no palpitations since starting flecainide. She will continue her current meds.

## 2014-11-21 NOTE — Assessment & Plan Note (Signed)
She had 6 weeks of symptoms which have gradually resolved. No change in meds.

## 2014-11-21 NOTE — Patient Instructions (Signed)
Medication Instructions:  Your physician recommends that you continue on your current medications as directed. Please refer to the Current Medication list given to you today.   Labwork: None ordered  Testing/Procedures: None ordered  Follow-Up: Your physician wants you to follow-up in: 6 months with Dr Knox Saliva will receive a reminder letter in the mail two months in advance. If you don't receive a letter, please call our office to schedule the follow-up appointment.   Any Other Special Instructions Will Be Listed Below (If Applicable).

## 2014-12-27 ENCOUNTER — Other Ambulatory Visit: Payer: Self-pay | Admitting: *Deleted

## 2014-12-27 MED ORDER — FENOFIBRATE 145 MG PO TABS: 145.0000 mg | ORAL_TABLET | Freq: Every day | ORAL | Status: DC

## 2015-03-19 ENCOUNTER — Other Ambulatory Visit: Payer: Self-pay | Admitting: Internal Medicine

## 2015-03-19 NOTE — Telephone Encounter (Signed)
REFILL 

## 2015-03-31 ENCOUNTER — Telehealth: Payer: Self-pay | Admitting: Internal Medicine

## 2015-03-31 DIAGNOSIS — E785 Hyperlipidemia, unspecified: Secondary | ICD-10-CM

## 2015-03-31 NOTE — Telephone Encounter (Signed)
Received a call from [place] at [facility].NMR Lipid panel no longer available.Order entered for [facility].

## 2015-04-02 ENCOUNTER — Ambulatory Visit (INDEPENDENT_AMBULATORY_CARE_PROVIDER_SITE_OTHER): Payer: PRIVATE HEALTH INSURANCE | Admitting: Internal Medicine

## 2015-04-02 ENCOUNTER — Encounter: Payer: Self-pay | Admitting: Internal Medicine

## 2015-04-02 VITALS — BP 118/62 | HR 65 | Ht 62.0 in | Wt 170.6 lb

## 2015-04-02 DIAGNOSIS — E785 Hyperlipidemia, unspecified: Secondary | ICD-10-CM

## 2015-04-02 DIAGNOSIS — I493 Ventricular premature depolarization: Secondary | ICD-10-CM

## 2015-04-02 DIAGNOSIS — I4891 Unspecified atrial fibrillation: Secondary | ICD-10-CM

## 2015-04-02 DIAGNOSIS — R5383 Other fatigue: Secondary | ICD-10-CM

## 2015-04-02 NOTE — Progress Notes (Signed)
OFFICE NOTE  Chief Complaint:  Routine follow-up  Primary Care Physician: Tawanna Solo, MD  HPI:  [name] is a 59 year old female previously followed by Dr. Rex Kras with a history of lower extremity edema recently and chest discomfort in the spring which she underwent a stress test for that was negative for ischemia with a preserved EF of 71%. She has had no further episodes of chest pain, and her edema has all but resolved, however, she stayed on the hydrochlorothiazide 25 mg daily. She also has hypertension which has been well controlled and a history of breast cancer but is now cancer free. Recently she had a fall and fractured her right humerus and has not been as active from that but has since started to get back to some more activity. 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 She recently had laboratory work which shows a well controlled lipid profile, total cholesterol 185, triglycerides 286, HDL 36 and LDL 92. This is on fenofibrate. She has had a history of elevated triglycerides in the past, but is nondiabetic.  She was doing very well when I saw her last week and recommended a dental holiday. She was not having any significant palpitations or PVCs that she mentioned. Less than a week after her office visit, she reported a day where she felt "terrible all day". She reported having numerous PVCs throughout the day and she says that that completely "zaps her energy". The next day she felt very fatigued and today, 2 days later she still feels quite weakened. Her EKG in the office today shows no evidence of PVCs.  This is apparently been an ongoing problem for years for her, was advanced coming paroxysmally and being associated with extreme fatigue.  I had referred her to see Dr. Cristopher Peru with cardiac electrophysiology. He recommended starting her on low-dose flecainide. Since starting that she has reported feeling much better. She  was placed on a longer monitor which showed essentially sinus rhythm and 1 atrial couplet which she noted a skipped beat otherwise no paroxysmal atrial fibrillation, PVC's, nonsustained VT or other arrhythmias were noted. Dr. Lovena Le recommended weaning off atenolol and she is currently on flecainide 75 mg twice daily.  [name] returns today for followup. She reports her palpitations are essentially gone and flecainide. She denies any chest pain. Unfortunately she's had recurrent sinusitis and is currently on doxycycline. She's had issues with her sinuses for a long time and now is also having problems with her years. I recommended she see an ear nose and throat doctor. She has had improvement in her lipid profile on current medications. Her LDL particle number is down to 1480, from 1824. Calculated LDL is down to 80 from 116 and triglycerides are mildly elevated still at 191.  [name] returns today for follow-up of her palpitations. She reports a been generally well controlled on flecainide. She is currently on senna 5 mg twice daily. Her last stress test was in 2013. She denies any chest pain or worsening shortness of breath. On aspirin for anticoagulation, given her young age and infrequency of symptoms.  I saw [name] back in the office today for follow-up. Overall she reports very good control of her palpitations. She denies any chest pain or shortness of breath. She reports her reflux symptoms are well controlled. She recently had a cholesterol check however that result is still pending. Were hoping to see an improvement in  cholesterol with the addition of fenofibrate. She is retired from [employer] job and now works full-time in [employer].   PMHx:  Past Medical History  Diagnosis Date  . PVC (premature ventricular contraction)   . Hyperchloremia   . Breast CA ([place])     right - radiation & surgery  . GERD (gastroesophageal reflux disease)   . Diverticulosis   . Gastroparesis      mild  . Hypertension   . Hyperlipidemia   . History of nuclear stress test 12/2011    bruce protocol; no evidence of ischemia, EF 79%, normal pattern of perfusion  . Family history of heart disease   . MVP (mitral valve prolapse)     last echo 07/1997 - normal LV size/function, mild MVP, mild MR    Past Surgical History  Procedure Laterality Date  . Mastectomy Right 2006    right lumpectomy  . Tonsillectomy  1969  . Appendectomy  01/2002  . Abdominal hysterectomy  03/2003  . Cardiac catheterization  10/13/2009    no significant fixed obstructive CAD, normral ramus intermediate/circumflex/RCA; normal LV function (Dr. Corky Downs)    FAMHx:  Family History  Problem Relation Age of Onset  . Alzheimer's disease Mother   . Heart failure Father   . Hypertension Father     also murmur, hyperlipidemia, MI, CABG age 72  . Diabetes Brother   . Heart disease Brother 73    MI at 47  . Hypertension Brother   . Hyperlipidemia Sister   . Breast cancer Maternal Aunt     PVCs  . Breast cancer Paternal Grandmother   . Diabetes Maternal Grandmother   . Heart failure Maternal Grandmother   . Emphysema Maternal Grandfather   . Heart disease Paternal Grandfather   . Asthma Child     x2    SOCHx:   reports that she has never smoked. She has never used smokeless tobacco. She reports that she drinks alcohol. She reports that she does not use illicit drugs.  ALLERGIES:  Allergies  Allergen Reactions  . Amoxicillin     REACTION: rash  . Chocolate   . Crestor [Rosuvastatin]     myalgias  . Erythromycin     REACTION: Nausea    ROS: A comprehensive review of systems was negative.  HOME MEDS: Current Outpatient Prescriptions  Medication Sig Dispense Refill  . aspirin EC 81 MG tablet Take 81 mg by mouth daily.      . Calcium Carbonate-Vitamin D (CALCIUM + D PO) Take 1 tablet by mouth at bedtime.     . cetirizine (ZYRTEC) 10 MG tablet Take 10 mg by mouth daily.      Marland Kitchen esomeprazole  (NEXIUM) 40 MG capsule Take 40 mg by mouth daily.     Marland Kitchen ezetimibe (ZETIA) 10 MG tablet Take 1 tablet (10 mg total) by mouth daily. 30 tablet 10  . fenofibrate (TRICOR) 145 MG tablet Take 1 tablet (145 mg total) by mouth daily. 30 tablet 6  . fish oil-omega-3 fatty acids 1000 MG capsule Take 1 g by mouth 2 (two) times daily.      . flecainide (TAMBOCOR) 50 MG tablet Take 1.5 tablets (75 mg total) by mouth 2 (two) times daily. 75 tablet 5  . hydrochlorothiazide (HYDRODIURIL) 25 MG tablet TAKE 1 TABLET DAILY. 30 tablet 1  . hyoscyamine (LEVSIN SL) 0.125 MG SL tablet PLACE 1-2 TABLETS UNDER TONGUE EVERY FOUR HOURS AS NEEDED FOR ABDOMINAL PAIN. 30 tablet 1  .  mometasone (NASONEX) 50 MCG/ACT nasal spray Place 1 spray into the nose daily.     . Multiple Vitamins-Minerals (MULTIPLE VITAMINS/WOMENS PO) Take by mouth daily.    Marland Kitchen PARoxetine (PAXIL-CR) 12.5 MG 24 hr tablet Take 12.5 mg by mouth every morning.      . simvastatin (ZOCOR) 20 MG tablet Take 1 tablet (20 mg total) by mouth daily. 30 tablet 11   No current facility-administered medications for this visit.    LABS/IMAGING: No results found for this or any previous visit (from the past 48 hour(s)). No results found.  VITALS: BP 118/62 mmHg  Pulse 65  Ht 5\' 2"  (1.575 m)  Wt 170 lb 9.6 oz (77.384 kg)  BMI 31.20 kg/m2  EXAM: General appearance: alert and no distress Neck: no adenopathy, no carotid bruit, no JVD, supple, symmetrical, trachea midline and thyroid not enlarged, symmetric, no tenderness/mass/nodules Heart: regular rate and rhythm, S1, S2 normal, no murmur, click, rub or gallop Extremities: extremities normal, atraumatic, no cyanosis or edema Pulses: 2+ and symmetric  EKG: Normal sinus rhythm at 65  ASSESSMENT: 1. PVCs-markedly improved on flecainde. 2. Hyperlipidemia 3. GERD  PLAN: 1.   Mrs. Sterba seems to have improved with regard to palpitations on flecainide. She takes low-dose aspirin for anticoagulation. She  recently had repeat cholesterol testing and we're waiting those results. Her reflux symptoms are improved as well.  Plan follow-up in 6 months or sooner as necessary.  Pixie Casino, MD, [facility] Attending Cardiologist [place] 04/02/2015, 2:49 PM

## 2015-04-02 NOTE — Patient Instructions (Signed)
Your physician wants you to follow-up in: 6 months with Dr. Debara Pickett. You will receive a reminder letter in the mail two months in advance. If you don't receive a letter, please call our office to schedule the follow-up appointment.    If you need a refill on your cardiac medications before your next appointment, please call your pharmacy.

## 2015-04-04 ENCOUNTER — Other Ambulatory Visit: Payer: Self-pay | Admitting: *Deleted

## 2015-04-04 ENCOUNTER — Encounter: Payer: Self-pay | Admitting: *Deleted

## 2015-04-04 DIAGNOSIS — E785 Hyperlipidemia, unspecified: Secondary | ICD-10-CM

## 2015-04-04 LAB — CARDIO IQ(R) ADVANCED LIPID PANEL
Apolipoprotein B: 85 mg/dL (ref 49–103)
Cholesterol, Total: 145 mg/dL (ref 125–200)
Cholesterol/HDL Ratio: 4.5 calc (ref <=5.0)
HDL Cholesterol: 32 mg/dL — ABNORMAL LOW (ref >=46)
LDL Large: 5117 nmol/L (ref 5038–17886)
LDL Medium: 298 nmol/L (ref 121–397)
LDL Particle Number: 1279 nmol/L (ref 1016–2185)
LDL Peak Size: 210.9 Angstrom — ABNORMAL LOW (ref >=218.2)
LDL Small: 329 nmol/L (ref 115–386)
LDL, Calculated: 78 mg/dL
Lipoprotein (a): 48 nmol/L (ref <75)
Non-HDL Cholesterol: 113 mg/dL
Triglycerides: 176 mg/dL — ABNORMAL HIGH

## 2015-04-16 ENCOUNTER — Encounter: Payer: Self-pay | Admitting: Internal Medicine

## 2015-04-18 ENCOUNTER — Other Ambulatory Visit: Payer: Self-pay | Admitting: Internal Medicine

## 2015-04-21 ENCOUNTER — Encounter: Payer: Self-pay | Admitting: Internal Medicine

## 2015-05-13 ENCOUNTER — Other Ambulatory Visit: Payer: Self-pay | Admitting: Internal Medicine

## 2015-05-19 ENCOUNTER — Other Ambulatory Visit: Payer: Self-pay | Admitting: Internal Medicine

## 2015-05-19 NOTE — Telephone Encounter (Signed)
REFILL 

## 2015-07-09 ENCOUNTER — Other Ambulatory Visit: Payer: Self-pay | Admitting: Internal Medicine

## 2015-07-13 ENCOUNTER — Other Ambulatory Visit: Payer: Self-pay | Admitting: Internal Medicine

## 2015-07-14 NOTE — Telephone Encounter (Signed)
Rx refill sent to pharmacy. 

## 2015-07-22 ENCOUNTER — Other Ambulatory Visit: Payer: Self-pay | Admitting: Internal Medicine

## 2015-07-23 NOTE — Telephone Encounter (Signed)
Rx(s) sent to pharmacy electronically.  

## 2015-09-10 ENCOUNTER — Other Ambulatory Visit: Payer: Self-pay | Admitting: Internal Medicine

## 2015-09-10 NOTE — Telephone Encounter (Signed)
Rx(s) sent to pharmacy electronically.  

## 2015-10-13 ENCOUNTER — Encounter: Payer: Self-pay | Admitting: Internal Medicine

## 2015-10-13 ENCOUNTER — Ambulatory Visit (INDEPENDENT_AMBULATORY_CARE_PROVIDER_SITE_OTHER): Payer: PRIVATE HEALTH INSURANCE | Admitting: Internal Medicine

## 2015-10-13 VITALS — BP 118/82 | HR 63 | Ht 62.0 in | Wt 167.0 lb

## 2015-10-13 DIAGNOSIS — Z79899 Other long term (current) drug therapy: Secondary | ICD-10-CM

## 2015-10-13 DIAGNOSIS — I493 Ventricular premature depolarization: Secondary | ICD-10-CM | POA: Diagnosis not present

## 2015-10-13 DIAGNOSIS — R12 Heartburn: Secondary | ICD-10-CM | POA: Diagnosis not present

## 2015-10-13 DIAGNOSIS — I4891 Unspecified atrial fibrillation: Secondary | ICD-10-CM

## 2015-10-13 DIAGNOSIS — Z5181 Encounter for therapeutic drug level monitoring: Secondary | ICD-10-CM | POA: Diagnosis not present

## 2015-10-13 NOTE — Patient Instructions (Signed)
Your physician wants you to follow-up in: 1 year with Dr. Hilty. You will receive a reminder letter in the mail two months in advance. If you don't receive a letter, please call our office to schedule the follow-up appointment.  

## 2015-10-14 LAB — CARDIO IQ(R) ADVANCED LIPID PANEL
Apolipoprotein B: 86 mg/dL (ref 49–103)
Cholesterol, Total: 152 mg/dL (ref 125–200)
Cholesterol/HDL Ratio: 4.3 calc (ref <=5.0)
HDL Cholesterol: 35 mg/dL — ABNORMAL LOW (ref >=46)
LDL Large: 4398 nmol/L — ABNORMAL LOW (ref 5038–17886)
LDL Medium: 301 nmol/L (ref 121–397)
LDL Particle Number: 1478 nmol/L (ref 1016–2185)
LDL Peak Size: 210.3 Angstrom — ABNORMAL LOW (ref >=218.2)
LDL Small: 407 nmol/L — ABNORMAL HIGH (ref 115–386)
LDL, Calculated: 79 mg/dL
Lipoprotein (a): 67 nmol/L (ref <75)
Non-HDL Cholesterol: 117 mg/dL
Triglycerides: 192 mg/dL — ABNORMAL HIGH

## 2015-10-17 NOTE — Progress Notes (Signed)
OFFICE NOTE  Chief Complaint:  Routine follow-up  Primary Care Physician: [name] Solo, MD  HPI:  [name] is a 60 year old female previously followed by [name] with a history of lower extremity edema recently and chest discomfort in the spring which she underwent a stress test for that was negative for ischemia with a preserved EF of 71%. She has had no further episodes of chest pain, and her edema has all but resolved, however, she stayed on the hydrochlorothiazide 25 mg daily. She also has hypertension which has been well controlled and a history of breast cancer but is now cancer free. Recently she had a fall and fractured her right humerus and has not been as active from that but has since started to get back to some more activity. 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 She recently had laboratory work which shows a well controlled lipid profile, total cholesterol 185, triglycerides 286, HDL 36 and LDL 92. This is on fenofibrate. She has had a history of elevated triglycerides in the past, but is nondiabetic.  She was doing very well when I saw her last week and recommended a dental holiday. She was not having any significant palpitations or PVCs that she mentioned. Less than a week after her office visit, she reported a day where she felt "terrible all day". She reported having numerous PVCs throughout the day and she says that that completely "zaps her energy". The next day she felt very fatigued and today, 2 days later she still feels quite weakened. Her EKG in the office today shows no evidence of PVCs.  This is apparently been an ongoing problem for years for her, was advanced coming paroxysmally and being associated with extreme fatigue.  I had referred her to see [name] with cardiac electrophysiology. He recommended starting her on low-dose flecainide. Since starting that she has reported feeling much better. She  was placed on a longer monitor which showed essentially sinus rhythm and 1 atrial couplet which she noted a skipped beat otherwise no paroxysmal atrial fibrillation, PVC's, nonsustained VT or other arrhythmias were noted. [name] recommended weaning off atenolol and she is currently on flecainide 75 mg twice daily.  [name] returns today for followup. She reports her palpitations are essentially gone and flecainide. She denies any chest pain. Unfortunately she's had recurrent sinusitis and is currently on doxycycline. She's had issues with her sinuses for a long time and now is also having problems with her years. I recommended she see an ear nose and throat doctor. She has had improvement in her lipid profile on current medications. Her LDL particle number is down to 1480, from 1824. Calculated LDL is down to 80 from 116 and triglycerides are mildly elevated still at 191.  [name] returns today for follow-up of her palpitations. She reports a been generally well controlled on flecainide. She is currently on senna 5 mg twice daily. Her last stress test was in 2013. She denies any chest pain or worsening shortness of breath. On aspirin for anticoagulation, given her young age and infrequency of symptoms.  I saw [name] back in the office today for follow-up. Overall she reports very good control of her palpitations. She denies any chest pain or shortness of breath. She reports her reflux symptoms are well controlled. She recently had a cholesterol check however that result is still pending. Were hoping to see an improvement in  cholesterol with the addition of fenofibrate. She is retired from [employer] job and now works full-time in [employer].   10/13/2015  [name] returns today for follow-up. She is without any complaints. She is overall doing well and continues to be active. She denies any recurrent atrial fibrillation. She is maintaining sinus rhythm at 63 with a QTC of 431 ms.  She is aware to avoid certain medications with her flecainide which is currently 75 mg twice a day. Cholesterol is been well controlled and is due for repeat check.  PMHx:  Past Medical History  Diagnosis Date  . PVC (premature ventricular contraction)   . Hyperchloremia   . Breast CA ([place])     right - radiation & surgery  . GERD (gastroesophageal reflux disease)   . Diverticulosis   . Gastroparesis     mild  . Hypertension   . Hyperlipidemia   . History of nuclear stress test 12/2011    bruce protocol; no evidence of ischemia, EF 79%, normal pattern of perfusion  . Family history of heart disease   . MVP (mitral valve prolapse)     last echo 07/1997 - normal LV size/function, mild MVP, mild MR    Past Surgical History  Procedure Laterality Date  . Mastectomy Right 2006    right lumpectomy  . Tonsillectomy  1969  . Appendectomy  01/2002  . Abdominal hysterectomy  03/2003  . Cardiac catheterization  10/13/2009    no significant fixed obstructive CAD, normral ramus intermediate/circumflex/RCA; normal LV function (Dr. Corky [name])    FAMHx:  Family History  Problem Relation Age of Onset  . Alzheimer's disease Mother   . Heart failure Father   . Hypertension Father     also murmur, hyperlipidemia, MI, CABG age 18  . Diabetes Brother   . Heart disease Brother 52    MI at 1  . Hypertension Brother   . Hyperlipidemia Sister   . Breast cancer Maternal Aunt     PVCs  . Breast cancer Paternal Grandmother   . Diabetes Maternal Grandmother   . Heart failure Maternal Grandmother   . Emphysema Maternal Grandfather   . Heart disease Paternal Grandfather   . Asthma Child     x2    SOCHx:   reports that she has never smoked. She has never used smokeless tobacco. She reports that she drinks alcohol. She reports that she does not use illicit drugs.  ALLERGIES:  Allergies  Allergen Reactions  . Amoxicillin     REACTION: rash  . Chocolate   . Crestor [Rosuvastatin]     myalgias    . Erythromycin     REACTION: Nausea    ROS: A comprehensive review of systems was negative.  HOME MEDS: Current Outpatient Prescriptions  Medication Sig Dispense Refill  . aspirin EC 81 MG tablet Take 81 mg by mouth daily.      . Calcium Carbonate-Vitamin D (CALCIUM + D PO) Take 1 tablet by mouth at bedtime.     . cetirizine (ZYRTEC) 10 MG tablet Take 10 mg by mouth daily.      Marland Kitchen esomeprazole (NEXIUM) 40 MG capsule Take 40 mg by mouth daily.     . fenofibrate (TRICOR) 145 MG tablet TAKE 1 TABLET ONCE DAILY. 30 tablet 6  . fish oil-omega-3 fatty acids 1000 MG capsule Take 1 g by mouth 2 (two) times daily.      . flecainide (TAMBOCOR) 50 MG tablet TAKE 1&1/2 TABLETS TWICE DAILY. [place]  tablet 1  . hydrochlorothiazide (HYDRODIURIL) 25 MG tablet TAKE 1 TABLET DAILY. 30 tablet 6  . hyoscyamine (LEVSIN SL) 0.125 MG SL tablet PLACE 1-2 TABLETS UNDER TONGUE EVERY FOUR HOURS AS NEEDED FOR ABDOMINAL PAIN. 30 tablet 3  . mometasone (NASONEX) 50 MCG/ACT nasal spray Place 1 spray into the nose daily.     . Multiple Vitamins-Minerals (MULTIPLE VITAMINS/WOMENS PO) Take by mouth daily.    Marland Kitchen PARoxetine (PAXIL-CR) 12.5 MG 24 hr tablet Take 12.5 mg by mouth every morning.      . simvastatin (ZOCOR) 20 MG tablet Take 1 tablet (20 mg total) by mouth daily. 30 tablet 11  . ZETIA 10 MG tablet TAKE 1 TABLET ONCE DAILY. 30 tablet 11   No current facility-administered medications for this visit.    LABS/IMAGING: No results found for this or any previous visit (from the past 48 hour(s)). No results found.  VITALS: BP 118/82 mmHg  Pulse 63  Ht 5\' 2"  (1.575 m)  Wt 167 lb (75.751 kg)  BMI 30.54 kg/m2  EXAM: General appearance: alert and no distress Neck: no adenopathy, no carotid bruit, no JVD, supple, symmetrical, trachea midline and thyroid not enlarged, symmetric, no tenderness/mass/nodules Heart: regular rate and rhythm, S1, S2 normal, no murmur, click, rub or gallop Extremities: extremities normal,  atraumatic, no cyanosis or edema Pulses: 2+ and symmetric  EKG: Normal sinus rhythm at 63, QTC 431 ms  ASSESSMENT: 1. PVCs-markedly improved on flecainde. 2. Hyperlipidemia 3. GERD  PLAN: 1.   Mrs. [name] is doing well with her palpitations and PVCs. She maintains on flecainide without any side effects. Cholesterol was recently rechecked however those results are not yet available. We will review those and contact her with any medication changes as necessary. Follow-up annually or sooner as needed.  Pixie Casino, MD, [facility] Attending Cardiologist [place] Tyse Auriemma 10/17/2015, 1:16 PM

## 2015-10-21 ENCOUNTER — Encounter: Payer: Self-pay | Admitting: Internal Medicine

## 2015-11-10 ENCOUNTER — Other Ambulatory Visit: Payer: Self-pay | Admitting: Internal Medicine

## 2015-12-12 ENCOUNTER — Other Ambulatory Visit: Payer: Self-pay | Admitting: Internal Medicine

## 2016-02-06 ENCOUNTER — Emergency Department (HOSPITAL_COMMUNITY)
Admission: EM | Admit: 2016-02-06 | Discharge: 2016-02-06 | Disposition: A | Discharge status: Home / Self Care | Payer: PRIVATE HEALTH INSURANCE | Attending: Emergency Medicine | Admitting: Emergency Medicine

## 2016-02-06 ENCOUNTER — Encounter (HOSPITAL_COMMUNITY): Payer: Self-pay | Admitting: *Deleted

## 2016-02-06 ENCOUNTER — Emergency Department (HOSPITAL_COMMUNITY): Payer: PRIVATE HEALTH INSURANCE

## 2016-02-06 DIAGNOSIS — Z7982 Long term (current) use of aspirin: Secondary | ICD-10-CM | POA: Insufficient documentation

## 2016-02-06 DIAGNOSIS — Z853 Personal history of malignant neoplasm of breast: Secondary | ICD-10-CM | POA: Diagnosis not present

## 2016-02-06 DIAGNOSIS — Z79899 Other long term (current) drug therapy: Secondary | ICD-10-CM | POA: Insufficient documentation

## 2016-02-06 DIAGNOSIS — R1013 Epigastric pain: Secondary | ICD-10-CM

## 2016-02-06 DIAGNOSIS — Z7984 Long term (current) use of oral hypoglycemic drugs: Secondary | ICD-10-CM | POA: Diagnosis not present

## 2016-02-06 DIAGNOSIS — I10 Essential (primary) hypertension: Secondary | ICD-10-CM | POA: Insufficient documentation

## 2016-02-06 DIAGNOSIS — E119 Type 2 diabetes mellitus without complications: Secondary | ICD-10-CM | POA: Diagnosis not present

## 2016-02-06 LAB — URINE MICROSCOPIC-ADD ON: RBC / HPF: NONE SEEN RBC/hpf (ref 0–5)

## 2016-02-06 LAB — URINALYSIS, ROUTINE W REFLEX MICROSCOPIC
Bilirubin Urine: NEGATIVE
Glucose, UA: NEGATIVE mg/dL
Hgb urine dipstick: NEGATIVE
Ketones, ur: NEGATIVE mg/dL
Nitrite: NEGATIVE
Protein, ur: NEGATIVE mg/dL
Specific Gravity, Urine: 1.015 (ref 1.005–1.030)
pH: 6 (ref 5.0–8.0)

## 2016-02-06 LAB — COMPREHENSIVE METABOLIC PANEL
ALT: 29 U/L (ref 14–54)
AST: 25 U/L (ref 15–41)
Albumin: 4.6 g/dL (ref 3.5–5.0)
Alkaline Phosphatase: 51 U/L (ref 38–126)
Anion gap: 6 (ref 5–15)
BUN: 23 mg/dL — ABNORMAL HIGH (ref 6–20)
CO2: 28 mmol/L (ref 22–32)
Calcium: 9.9 mg/dL (ref 8.9–10.3)
Chloride: 106 mmol/L (ref 101–111)
Creatinine, Ser: 1.1 mg/dL — ABNORMAL HIGH (ref 0.44–1.00)
GFR calc Af Amer: >60 mL/min (ref >60)
GFR calc non Af Amer: 53 mL/min — ABNORMAL LOW (ref >60)
Glucose, Bld: 91 mg/dL (ref 65–99)
Potassium: 3.4 mmol/L — ABNORMAL LOW (ref 3.5–5.1)
Sodium: 140 mmol/L (ref 135–145)
Total Bilirubin: 0.6 mg/dL (ref 0.3–1.2)
Total Protein: 7.3 g/dL (ref 6.5–8.1)

## 2016-02-06 LAB — CBC
HCT: 40.9 % (ref 36.0–46.0)
Hemoglobin: 13.7 g/dL (ref 12.0–15.0)
MCH: 31.6 pg (ref 26.0–34.0)
MCHC: 33.5 g/dL (ref 30.0–36.0)
MCV: 94.2 fL (ref 78.0–100.0)
Platelets: 347 10*3/uL (ref 150–400)
RBC: 4.34 MIL/uL (ref 3.87–5.11)
RDW: 13.4 % (ref 11.5–15.5)
WBC: 10 10*3/uL (ref 4.0–10.5)

## 2016-02-06 LAB — LIPASE, BLOOD: Lipase: 25 U/L (ref 11–51)

## 2016-02-06 LAB — I-STAT TROPONIN, ED: Troponin i, poc: 0 ng/mL (ref 0.00–0.08)

## 2016-02-06 MED ORDER — SODIUM CHLORIDE 0.9 % IV SOLN
INTRAVENOUS | Status: DC
  Administered 2016-02-06: 125 mL/h via INTRAVENOUS

## 2016-02-06 MED ORDER — HYDROMORPHONE HCL 1 MG/ML IJ SOLN
0.5000 mg | Freq: Once | INTRAMUSCULAR | Status: AC
  Administered 2016-02-06: 0.5 mg via INTRAVENOUS
  Filled 2016-02-06: qty 1

## 2016-02-06 MED ORDER — DIPHENHYDRAMINE HCL 50 MG/ML IJ SOLN
12.5000 mg | Freq: Once | INTRAMUSCULAR | Status: AC
  Administered 2016-02-06: 12.5 mg via INTRAVENOUS
  Filled 2016-02-06: qty 1

## 2016-02-06 MED ORDER — SUCRALFATE 1 G PO TABS: 1.0000 g | ORAL_TABLET | Freq: Four times a day (QID) | ORAL | 0 refills | Status: DC

## 2016-02-06 MED ORDER — METOCLOPRAMIDE HCL 5 MG/ML IJ SOLN
10.0000 mg | Freq: Once | INTRAMUSCULAR | Status: AC
  Administered 2016-02-06: 10 mg via INTRAVENOUS
  Filled 2016-02-06: qty 2

## 2016-02-06 NOTE — ED Provider Notes (Signed)
[facility] DEPT Provider Note   CSN: [id number] Arrival date & time: 02/06/16  1826     History   Chief Complaint Chief Complaint  Patient presents with  . Abdominal Pain  . Back Pain    HPI [name] is a 60 y.o. female.  60 year old female presents with one-day history of epigastric abdominal pain characterizes a heaviness and fullness with associated burping. Symptoms started after she ate pinto beans Got worse after she ate meatballs. Has had severe nausea but no vomiting. No fever or chills. No black or bloody stools. Pain radiates to her back. Does have a history of GERD. Denies any history of cholelithiasis. Nothing makes her symptoms better. No treatment use prior to arrival      Past Medical History:  Diagnosis Date  . Breast CA ([place])    right - radiation & surgery  . Diverticulosis   . Family history of heart disease   . Gastroparesis    mild  . GERD (gastroesophageal reflux disease)   . History of nuclear stress test 12/2011   bruce protocol; no evidence of ischemia, EF 79%, normal pattern of perfusion  . Hyperchloremia   . Hyperlipidemia   . Hypertension   . MVP (mitral valve prolapse)    last echo 07/1997 - normal LV size/function, mild MVP, mild MR  . PVC (premature ventricular contraction)     Patient Active Problem List   Diagnosis Date Noted  . Encounter for monitoring flecainide therapy 09/23/2014  . Atrial fibrillation, unspecified 09/23/2014  . Sinusitis 03/18/2014  . Fatigue 03/25/2013  . PVC's (premature ventricular contractions) 03/19/2013  . HYPERTHYROIDISM 11/07/2009  . DM2 (diabetes mellitus, type 2) ([place]) 11/07/2009  . ANXIETY 11/07/2009  . GERD 11/07/2009  . EARLY SATIETY 11/07/2009  . WEIGHT LOSS-ABNORMAL 11/07/2009  . HEARTBURN 11/07/2009  . ABDOMINAL PAIN-EPIGASTRIC 11/07/2009  . PERSONAL HX BREAST CANCER 11/07/2009  . ADENOCARCINOMA, BREAST 08/04/2007  . Hyperlipidemia 08/04/2007  . IRRITABLE BOWEL SYNDROME  08/04/2007  . OVARIAN CYST 08/04/2007  . ABDOMINAL BLOATING 08/04/2007  . GASTRIC POLYP 03/30/2007  . GASTROPARESIS 03/28/2007  . DIVERTICULOSIS, COLON 06/26/2004    Past Surgical History:  Procedure Laterality Date  . ABDOMINAL HYSTERECTOMY  03/2003  . APPENDECTOMY  01/2002  . CARDIAC CATHETERIZATION  10/13/2009   no significant fixed obstructive CAD, normral ramus intermediate/circumflex/RCA; normal LV function (Dr. Corky Downs)  . MASTECTOMY Right 2006   right lumpectomy  . TONSILLECTOMY  1969    OB History    No data available       Home Medications    Prior to Admission medications   Medication Sig Start Date End Date Taking? Authorizing Provider  aspirin EC 81 MG tablet Take 81 mg by mouth daily.      Historical Provider, MD  Calcium Carbonate-Vitamin D (CALCIUM + D PO) Take 1 tablet by mouth at bedtime.     Historical Provider, MD  cetirizine (ZYRTEC) 10 MG tablet Take 10 mg by mouth daily.      Historical Provider, MD  esomeprazole (NEXIUM) 40 MG capsule Take 40 mg by mouth daily.     Historical Provider, MD  fenofibrate (TRICOR) 145 MG tablet TAKE 1 TABLET ONCE DAILY. 07/23/15   Pixie Casino, MD  fish oil-omega-3 fatty acids 1000 MG capsule Take 1 g by mouth 2 (two) times daily.      Historical Provider, MD  flecainide (TAMBOCOR) 50 MG tablet Take 1.5 tablets (75 mg total) by mouth 2 (two) times daily.  11/11/15   Pixie Casino, MD  hydrochlorothiazide (HYDRODIURIL) 25 MG tablet TAKE 1 TABLET DAILY. 12/12/15   Pixie Casino, MD  hyoscyamine (LEVSIN SL) 0.125 MG SL tablet PLACE 1-2 TABLETS UNDER TONGUE EVERY FOUR HOURS AS NEEDED FOR ABDOMINAL PAIN. 07/11/15   Irene Shipper, MD  mometasone (NASONEX) 50 MCG/ACT nasal spray Place 1 spray into the nose daily.     Historical Provider, MD  Multiple Vitamins-Minerals (MULTIPLE VITAMINS/WOMENS PO) Take by mouth daily.    Historical Provider, MD  PARoxetine (PAXIL-CR) 12.5 MG 24 hr tablet Take 12.5 mg by mouth every morning.       Historical Provider, MD  simvastatin (ZOCOR) 20 MG tablet Take 1 tablet (20 mg total) by mouth daily at 6 PM. 11/11/15   Pixie Casino, MD  ZETIA 10 MG tablet TAKE 1 TABLET ONCE DAILY. 04/18/15   Pixie Casino, MD    Family History Family History  Problem Relation Age of Onset  . Diabetes Maternal Grandmother   . Heart failure Maternal Grandmother   . Emphysema Maternal Grandfather   . Breast cancer Paternal Grandmother   . Heart disease Paternal Grandfather   . Alzheimer's disease Mother   . Heart failure Father   . Hypertension Father     also murmur, hyperlipidemia, MI, CABG age 32  . Diabetes Brother   . Heart disease Brother 61    MI at 82  . Hypertension Brother   . Hyperlipidemia Sister   . Breast cancer Maternal Aunt     PVCs  . Asthma Child     x2    Social History Social History  Substance Use Topics  . Smoking status: Never Smoker  . Smokeless tobacco: Never Used  . Alcohol use Yes     Allergies   Amoxicillin; Chocolate; Crestor [rosuvastatin]; and Erythromycin   Review of Systems Review of Systems  All other systems reviewed and are negative.    Physical Exam Updated Vital Signs BP (!) 133/109 (BP Location: Left Arm)   Pulse 64   Temp 97.9 F (36.6 C) (Oral)   Resp 18   SpO2 97%   Physical Exam  Constitutional: She is oriented to person, place, and time. She appears well-developed and well-nourished.  Non-toxic appearance. No distress.  HENT:  Head: Normocephalic and atraumatic.  Eyes: Conjunctivae, EOM and lids are normal. Pupils are equal, round, and reactive to light.  Neck: Normal range of motion. Neck supple. No tracheal deviation present. No thyroid mass present.  Cardiovascular: Normal rate, regular rhythm and normal heart sounds.  Exam reveals no gallop.   No murmur heard. Pulmonary/Chest: Effort normal and breath sounds normal. No stridor. No respiratory distress. She has no decreased breath sounds. She has no wheezes. She has no  rhonchi. She has no rales.  Abdominal: Soft. Normal appearance and bowel sounds are normal. She exhibits no distension. There is tenderness in the epigastric area. There is no rebound and no CVA tenderness.  Musculoskeletal: Normal range of motion. She exhibits no edema or tenderness.  Neurological: She is alert and oriented to person, place, and time. She has normal strength. No cranial nerve deficit or sensory deficit. GCS eye subscore is 4. GCS verbal subscore is 5. GCS motor subscore is 6.  Skin: Skin is warm and dry. No abrasion and no rash noted.  Psychiatric: She has a normal mood and affect. Her speech is normal and behavior is normal.  Nursing note and vitals reviewed.    ED  Treatments / Results  Labs (all labs ordered are listed, but only abnormal results are displayed) Labs Reviewed  COMPREHENSIVE METABOLIC PANEL - Abnormal; Notable for the following:       Result Value   Potassium 3.4 (*)    BUN 23 (*)    Creatinine, Ser 1.10 (*)    GFR calc non Af Amer 53 (*)    All other components within normal limits  LIPASE, BLOOD  CBC  URINALYSIS, ROUTINE W REFLEX MICROSCOPIC (NOT AT [facility])  I-STAT TROPOININ, ED    EKG  EKG Interpretation  Date/Time:  Friday February 06 2016 18:44:05 EDT Ventricular Rate:  62 PR Interval:    QRS Duration: 94 QT Interval:  431 QTC Calculation: 438 R Axis:   34 Text Interpretation:  Sinus rhythm Low voltage, precordial leads No significant change since last tracing Confirmed by Evgenia Merriman  MD, Brice Kossman ([id number]) on 02/06/2016 7:48:32 PM       Radiology No results found.  Procedures Procedures (including critical care time)  Medications Ordered in ED Medications  0.9 %  sodium chloride infusion (not administered)  metoCLOPramide (REGLAN) injection 10 mg (not administered)  diphenhydrAMINE (BENADRYL) injection 12.5 mg (not administered)  HYDROmorphone (DILAUDID) injection 0.5 mg (not administered)     Initial Impression / Assessment and Plan  / ED Course  I have reviewed the triage vital signs and the nursing notes.  Pertinent labs & imaging results that were available during my care of the patient were reviewed by me and considered in my medical decision making (see chart for details).  Clinical Course    Patient medicated here feels better. Suspect that she has reflux. She was informed of the importance of follow-up for her possible left kidney lesion. She states that she has had a history of this in the past  Final Clinical Impressions(s) / ED Diagnoses   Final diagnoses:  None    New Prescriptions New Prescriptions   No medications on file     Lacretia Leigh, MD 02/06/16 2246

## 2016-02-06 NOTE — ED Notes (Signed)
MD at bedside. 

## 2016-02-06 NOTE — ED Notes (Signed)
Patient d/c'd self care.  F/U and medications reviewed.  Patient verbalized understanding. 

## 2016-02-06 NOTE — ED Triage Notes (Signed)
Pt complains of epigastric pain radiating to between her shoulder blades and nausea since last night. Pt states "I feel like I swallowed a basketball".

## 2016-02-06 NOTE — Discharge Instructions (Signed)
A possible mass on your left kidney was seen on the ultrasound and he will need to have an outpatient abdominal CT to rule out this out.  Follow-up with your doctor for this

## 2016-02-06 NOTE — ED Notes (Signed)
Patient ambulatory to restroom  ?

## 2016-02-23 ENCOUNTER — Other Ambulatory Visit: Payer: Self-pay | Admitting: Internal Medicine

## 2016-02-23 NOTE — Telephone Encounter (Signed)
REFILL 

## 2016-03-22 ENCOUNTER — Other Ambulatory Visit: Payer: Self-pay | Admitting: Internal Medicine

## 2016-04-01 ENCOUNTER — Encounter: Payer: Self-pay | Admitting: Internal Medicine

## 2016-04-01 ENCOUNTER — Ambulatory Visit (INDEPENDENT_AMBULATORY_CARE_PROVIDER_SITE_OTHER): Payer: PRIVATE HEALTH INSURANCE | Admitting: Internal Medicine

## 2016-04-01 ENCOUNTER — Other Ambulatory Visit (INDEPENDENT_AMBULATORY_CARE_PROVIDER_SITE_OTHER): Payer: PRIVATE HEALTH INSURANCE

## 2016-04-01 ENCOUNTER — Encounter (INDEPENDENT_AMBULATORY_CARE_PROVIDER_SITE_OTHER): Payer: Self-pay

## 2016-04-01 VITALS — BP 120/80 | HR 64 | Ht 61.5 in | Wt 173.0 lb

## 2016-04-01 DIAGNOSIS — N289 Disorder of kidney and ureter, unspecified: Secondary | ICD-10-CM

## 2016-04-01 DIAGNOSIS — R1011 Right upper quadrant pain: Secondary | ICD-10-CM

## 2016-04-01 DIAGNOSIS — R938 Abnormal findings on diagnostic imaging of other specified body structures: Secondary | ICD-10-CM

## 2016-04-01 DIAGNOSIS — R9389 Abnormal findings on diagnostic imaging of other specified body structures: Secondary | ICD-10-CM

## 2016-04-01 LAB — BASIC METABOLIC PANEL
BUN: 23 mg/dL (ref 6–23)
CO2: 30 mEq/L (ref 19–32)
Calcium: 10.4 mg/dL (ref 8.4–10.5)
Chloride: 105 mEq/L (ref 96–112)
Creatinine, Ser: 1.03 mg/dL (ref 0.40–1.20)
GFR: 57.65 mL/min — ABNORMAL LOW (ref >60.00)
Glucose, Bld: 97 mg/dL (ref 70–99)
Potassium: 3.9 mEq/L (ref 3.5–5.1)
Sodium: 143 mEq/L (ref 135–145)

## 2016-04-01 MED ORDER — HYOSCYAMINE SULFATE 0.125 MG SL SUBL: SUBLINGUAL_TABLET | SUBLINGUAL | 3 refills | Status: DC

## 2016-04-01 NOTE — Progress Notes (Signed)
HISTORY OF PRESENT ILLNESS:  [name] is a 60 y.o. female with a history of hypertension, hyperlipidemia, obesity, breast cancer, GERD requiring PPI, and IBS. She has been previously evaluated for abdominal bloating with abdominal discomfort and nausea (2011) and epigastric/right upper quadrant pain (2014). Evaluations have included abdominal ultrasound, HIDA scan, CT scan of the abdomen and pelvis, colonoscopy, and upper endoscopy. These were unremarkable are unrevealing. She did have abnormal gastric emptying scan remotely (2008). For problems with recurrent right upper quadrant pain she has used Levsin sublingual. Generally has discomfort about once every 2 months. Levsin results of discomfort within 30-60 minutes typically. However on 02/06/2016 she developed pain in the back with radiation into the epigastric region and subsequently right upper quadrant. This was severe and did not respond to Levsin. She presented herself to the emergency room. Comprehensive metabolic panel was unremarkable including normal liver tests. Normal troponin. Unremarkable CBC. Abdominal ultrasound was performed and revealed hepatic steatosis and a questionable mass in the upper pole of the left kidney. No other abnormalities. No gallbladder abnormalities. She was treated symptomatically and discharged. She has use Levsin on one occasion since. The patient denies having problems with her bowel habits currently. Still with some bloating. No weight loss.  REVIEW OF SYSTEMS:  All non-GI ROS negative except for sinus allergy, arthritis, irregular heartbeats, sore throat  Past Medical History:  Diagnosis Date  . Breast CA ([place])    right - radiation & surgery  . Diverticulosis   . Family history of heart disease   . Gastroparesis    mild  . GERD (gastroesophageal reflux disease)   . History of nuclear stress test 12/2011   bruce protocol; no evidence of ischemia, EF 79%, normal pattern of perfusion  . Hyperchloremia    . Hyperlipidemia   . Hypertension   . MVP (mitral valve prolapse)    last echo 07/1997 - normal LV size/function, mild MVP, mild MR  . PVC (premature ventricular contraction)     Past Surgical History:  Procedure Laterality Date  . ABDOMINAL HYSTERECTOMY  03/2003  . APPENDECTOMY  01/2002  . CARDIAC CATHETERIZATION  10/13/2009   no significant fixed obstructive CAD, normral ramus intermediate/circumflex/RCA; normal LV function (Dr. Corky Downs)  . MASTECTOMY Right 2006   right lumpectomy  . [place]  reports that she has never smoked. She has never used smokeless tobacco. She reports that she drinks alcohol. She reports that she does not use drugs.  family history includes Alzheimer's disease in her mother; Asthma in her child; Breast cancer in her maternal aunt and paternal grandmother; Diabetes in her brother and maternal grandmother; Emphysema in her maternal grandfather; Heart disease in her paternal grandfather; Heart disease (age of onset: 65) in her brother; Heart failure in her father and maternal grandmother; Hyperlipidemia in her sister; Hypertension in her brother and father.  Allergies  Allergen Reactions  . Amoxicillin     REACTION: rash  . Chocolate   . Crestor [Rosuvastatin]     myalgias  . Erythromycin     REACTION: Nausea       PHYSICAL EXAMINATION: Vital signs: BP 120/80 (BP Location: Left Arm, Patient Position: Sitting, Cuff Size: Normal)   Pulse 64   Ht 5' 1.5" (1.562 m) Comment: height measured without shoes  Wt 173 lb (78.5 kg)   BMI 32.16 kg/m   Constitutional: Pleasant, obese, generally well-appearing, no acute distress Psychiatric: alert and oriented x3, cooperative  Eyes: extraocular movements intact, anicteric, conjunctiva pink Mouth: oral pharynx moist, no lesions Neck: supple no lymphadenopathy Cardiovascular: heart regular rate and rhythm, no murmur Lungs: clear to auscultation bilaterally Abdomen:  soft, obese, tenderness over the muscle wall the right upper quadrant which does not reproduce her pain, otherwise nontender, nondistended, no obvious ascites, no peritoneal signs, normal bowel sounds, no organomegaly Rectal: Omitted Extremities: no clubbing cyanosis or lower extremity edema bilaterally Skin: no lesions on visible extremities Neuro: No focal deficits. Normal DTRs. Cranial nerves intact  ASSESSMENT:  #1. Recurrent right upper quadrant pain as described. Extensive negative workups. Question interval gallbladder dysfunction. Question spasm. Question related to remote gastroparesis though somewhat atypical certainly #2. Fatty liver on imaging. Normal liver tests. No clinical evidence for relevant liver disease #3. Obesity #4. GERD. No active symptoms on PPI. Significant symptoms off PPI #5. Question left kidney lesion on ultrasound. Needs clarified #6. Colonoscopy with ileal intubation 2014 with diverticulosis only #7. EGD 2014 unremarkable #8. History of gastroparesis 2008  PLAN:  #1. Refill Levsin sublingual area continue to use as needed for pain, as this helps #2. Weight loss to help with GERD and fatty liver #3. Reflux precautions #4. Continue PPI #5. Schedule contrast-enhanced CT scan of the abdomen and pelvis to clarify ultrasound abnormality of left kidney  #6. HIDA scan. Rule out interval change #7. Could consider repeat gastric emptying scan if symptoms related to gastroparesis become a question #8. Screening colonoscopy 2024  Ongoing general medical care with Dr. Sabra Heck

## 2016-04-01 NOTE — Patient Instructions (Addendum)
Your physician has requested that you go to the basement for the following lab work before leaving today:  BMET  You have been scheduled for a CT scan of the abdomen and pelvis at [facility] ([address]---this is in the same building as [facility]).   You are scheduled on  at 04/06/2016 at 11:00am You should arrive 15 minutes prior to your appointment time for registration. Please follow the written instructions below on the day of your exam:  WARNING: IF YOU ARE ALLERGIC TO IODINE/X-RAY DYE, PLEASE NOTIFY RADIOLOGY IMMEDIATELY AT [phone]! YOU WILL BE GIVEN A 13 HOUR PREMEDICATION PREP.  1) Do not eat or drink anything after 7:00am (4 hours prior to your test) 2) You have been given 2 bottles of oral contrast to drink. The solution may taste               better if refrigerated, but do NOT add ice or any other liquid to this solution. Shake             well before drinking.    Drink 1 bottle of contrast @ 9:00am (2 hours prior to your exam)  Drink 1 bottle of contrast @ 10:00am (1 hour prior to your exam)  You may take any medications as prescribed with a small amount of water except for the following: Metformin, Glucophage, Glucovance, Avandamet, Riomet, Fortamet, Actoplus Met, Janumet, Glumetza or Metaglip. The above medications must be held the day of the exam AND 48 hours after the exam.  The purpose of you drinking the oral contrast is to aid in the visualization of your intestinal tract. The contrast solution may cause some diarrhea. Before your exam is started, you will be given a small amount of fluid to drink. Depending on your individual set of symptoms, you may also receive an intravenous injection of x-ray contrast/dye. Plan on being at [facility] for 30 minutes or long, depending on the type of exam you are having performed.  If you have any questions regarding your exam or if you need to reschedule, you may call the CT department at [phone]  between the hours of 8:00 am and 5:00 pm, Monday-Friday.  ________________________________________________________________________  Dennis Bast have been scheduled for a HIDA scan at [facility] (1st floor) on 04/19/2016. Please arrive 15 minutes prior to your scheduled appointment at  7:79TJ. Make certain not to have anything to eat or drink after midnight prior to your test. Should this appointment date or time not work well for you, please call radiology scheduling at [phone].  _____________________________________________________________________ hepatobiliary (HIDA) scan is an imaging procedure used to diagnose problems in the liver, gallbladder and bile ducts. In the HIDA scan, a radioactive chemical or tracer is injected into a vein in your arm. The tracer is handled by the liver like bile. Bile is a fluid produced and excreted by your liver that helps your digestive system break down fats in the foods you eat. Bile is stored in your gallbladder and the gallbladder releases the bile when you eat a meal. A special nuclear medicine scanner (gamma camera) tracks the flow of the tracer from your liver into your gallbladder and small intestine.  During your HIDA scan  You'll be asked to change into a hospital gown before your HIDA scan begins. Your health care team will position you on a table, usually on your back. The radioactive tracer is then injected into a vein in your arm.The tracer travels through your bloodstream  to your liver, where it's taken up by the bile-producing cells. The radioactive tracer travels with the bile from your liver into your gallbladder and through your bile ducts to your small intestine.You may feel some pressure while the radioactive tracer is injected into your vein. As you lie on the table, a special gamma camera is positioned over your abdomen taking pictures of the tracer as it moves through your body. The gamma camera takes pictures continually for about an hour.  You'll need to keep still during the HIDA scan. This can become uncomfortable, but you may find that you can lessen the discomfort by taking deep breaths and thinking about other things. Tell your health care team if you're uncomfortable. The radiologist will watch on a computer the progress of the radioactive tracer through your body. The HIDA scan may be stopped when the radioactive tracer is seen in the gallbladder and enters your small intestine. This typically takes about an hour. In some cases extra imaging will be performed if original images aren't satisfactory, if morphine is given to help visualize the gallbladder or if the medication CCK is given to look at the contraction of the gallbladder. This test typically takes 2 hours to complete. ________________________________________________________________________

## 2016-04-06 ENCOUNTER — Ambulatory Visit (INDEPENDENT_AMBULATORY_CARE_PROVIDER_SITE_OTHER)
Admission: RE | Admit: 2016-04-06 | Discharge: 2016-04-06 | Disposition: A | Discharge status: Home / Self Care | Payer: PRIVATE HEALTH INSURANCE | Source: Ambulatory Visit | Attending: Internal Medicine | Admitting: Internal Medicine

## 2016-04-06 DIAGNOSIS — R938 Abnormal findings on diagnostic imaging of other specified body structures: Secondary | ICD-10-CM

## 2016-04-06 DIAGNOSIS — N289 Disorder of kidney and ureter, unspecified: Secondary | ICD-10-CM | POA: Diagnosis not present

## 2016-04-06 DIAGNOSIS — R9389 Abnormal findings on diagnostic imaging of other specified body structures: Secondary | ICD-10-CM

## 2016-04-06 DIAGNOSIS — R1011 Right upper quadrant pain: Secondary | ICD-10-CM

## 2016-04-06 MED ORDER — IOPAMIDOL (ISOVUE-300) INJECTION 61%
100.0000 mL | Freq: Once | INTRAVENOUS | Status: AC | PRN
  Administered 2016-04-06: 100 mL via INTRAVENOUS

## 2016-04-19 ENCOUNTER — Ambulatory Visit (HOSPITAL_COMMUNITY)
Admission: RE | Admit: 2016-04-19 | Discharge: 2016-04-19 | Disposition: A | Discharge status: Home / Self Care | Payer: PRIVATE HEALTH INSURANCE | Source: Ambulatory Visit | Attending: Internal Medicine | Admitting: Internal Medicine

## 2016-04-19 DIAGNOSIS — R1011 Right upper quadrant pain: Secondary | ICD-10-CM

## 2016-04-19 DIAGNOSIS — R938 Abnormal findings on diagnostic imaging of other specified body structures: Secondary | ICD-10-CM | POA: Insufficient documentation

## 2016-04-19 DIAGNOSIS — R9389 Abnormal findings on diagnostic imaging of other specified body structures: Secondary | ICD-10-CM

## 2016-04-19 DIAGNOSIS — N289 Disorder of kidney and ureter, unspecified: Secondary | ICD-10-CM | POA: Diagnosis present

## 2016-04-19 MED ORDER — TECHNETIUM TC 99M MEBROFENIN IV KIT: 5.0000 | PACK | Freq: Once | INTRAVENOUS | Status: DC | PRN

## 2016-04-19 MED ORDER — SINCALIDE 5 MCG IJ SOLR
0.0200 ug/kg | Freq: Once | INTRAMUSCULAR | Status: AC
  Administered 2016-04-19: 1.6 ug via INTRAVENOUS

## 2016-04-22 ENCOUNTER — Other Ambulatory Visit: Payer: Self-pay | Admitting: Internal Medicine

## 2016-07-08 ENCOUNTER — Other Ambulatory Visit: Payer: Self-pay | Admitting: Internal Medicine

## 2016-07-08 NOTE — Telephone Encounter (Signed)
Rx request sent to pharmacy.  

## 2016-08-12 ENCOUNTER — Other Ambulatory Visit: Payer: Self-pay | Admitting: *Deleted

## 2016-08-12 DIAGNOSIS — E785 Hyperlipidemia, unspecified: Secondary | ICD-10-CM

## 2016-10-08 LAB — CARDIO IQ(R) ADVANCED LIPID PANEL
Apolipoprotein B: 82 mg/dL (ref 49–103)
Cholesterol, Total: 144 mg/dL (ref <200)
Cholesterol/HDL Ratio: 4.8 calc (ref <5.0)
HDL Cholesterol: 30 mg/dL — ABNORMAL LOW (ref >50)
LDL Large: 4299 nmol/L — ABNORMAL LOW (ref 5038–17886)
LDL Medium: 213 nmol/L (ref 121–397)
LDL Particle Number: 1194 nmol/L (ref 1016–2185)
LDL Peak Size: 208.6 Angstrom — ABNORMAL LOW (ref >=218.2)
LDL Small: 311 nmol/L (ref 115–386)
LDL, Calculated: 86 mg/dL (ref <100)
Lipoprotein (a): 40 nmol/L (ref <75)
Non-HDL Cholesterol: 114 mg/dL (calc) (ref <130)
Triglycerides: 188 mg/dL — ABNORMAL HIGH (ref <150)

## 2016-10-14 ENCOUNTER — Ambulatory Visit (INDEPENDENT_AMBULATORY_CARE_PROVIDER_SITE_OTHER): Payer: PRIVATE HEALTH INSURANCE | Admitting: Internal Medicine

## 2016-10-14 ENCOUNTER — Encounter: Payer: Self-pay | Admitting: Internal Medicine

## 2016-10-14 VITALS — BP 118/88 | HR 63 | Ht 61.5 in | Wt 171.0 lb

## 2016-10-14 DIAGNOSIS — Z5181 Encounter for therapeutic drug level monitoring: Secondary | ICD-10-CM

## 2016-10-14 DIAGNOSIS — Z79899 Other long term (current) drug therapy: Secondary | ICD-10-CM

## 2016-10-14 DIAGNOSIS — I493 Ventricular premature depolarization: Secondary | ICD-10-CM | POA: Diagnosis not present

## 2016-10-14 DIAGNOSIS — E785 Hyperlipidemia, unspecified: Secondary | ICD-10-CM | POA: Diagnosis not present

## 2016-10-14 NOTE — Progress Notes (Signed)
OFFICE NOTE  Chief Complaint:  Routine follow-up  Primary Care Physician: Tawanna Solo, MD  HPI:  [name] is a 61 year old female previously followed by Dr. Rex Kras with a history of lower extremity edema recently and chest discomfort in the spring which she underwent a stress test for that was negative for ischemia with a preserved EF of 71%. She has had no further episodes of chest pain, and her edema has all but resolved, however, she stayed on the hydrochlorothiazide 25 mg daily. She also has hypertension which has been well controlled and a history of breast cancer but is now cancer free. Recently she had a fall and fractured her right humerus and has not been as active from that but has since started to get back to some more activity. 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 She recently had laboratory work which shows a well controlled lipid profile, total cholesterol 185, triglycerides 286, HDL 36 and LDL 92. This is on fenofibrate. She has had a history of elevated triglycerides in the past, but is nondiabetic.  She was doing very well when I saw her last week and recommended a dental holiday. She was not having any significant palpitations or PVCs that she mentioned. Less than a week after her office visit, she reported a day where she felt "terrible all day". She reported having numerous PVCs throughout the day and she says that that completely "zaps her energy". The next day she felt very fatigued and today, 2 days later she still feels quite weakened. Her EKG in the office today shows no evidence of PVCs.  This is apparently been an ongoing problem for years for her, was advanced coming paroxysmally and being associated with extreme fatigue.  I had referred her to see Dr. Cristopher Peru with cardiac electrophysiology. He recommended starting her on low-dose flecainide. Since starting that she has reported feeling much better. She  was placed on a longer monitor which showed essentially sinus rhythm and 1 atrial couplet which she noted a skipped beat otherwise no paroxysmal atrial fibrillation, PVC's, nonsustained VT or other arrhythmias were noted. Dr. Lovena Le recommended weaning off atenolol and she is currently on flecainide 75 mg twice daily.  [name] returns today for followup. She reports her palpitations are essentially gone and flecainide. She denies any chest pain. Unfortunately she's had recurrent sinusitis and is currently on doxycycline. She's had issues with her sinuses for a long time and now is also having problems with her years. I recommended she see an ear nose and throat doctor. She has had improvement in her lipid profile on current medications. Her LDL particle number is down to 1480, from 1824. Calculated LDL is down to 80 from 116 and triglycerides are mildly elevated still at 191.  [name] returns today for follow-up of her palpitations. She reports a been generally well controlled on flecainide. She is currently on senna 5 mg twice daily. Her last stress test was in 2013. She denies any chest pain or worsening shortness of breath. On aspirin for anticoagulation, given her young age and infrequency of symptoms.b  I saw [name] back in the office today for follow-up. Overall she reports very good control of her palpitations. She denies any chest pain or shortness of breath. She reports her reflux symptoms are well controlled. She recently had a cholesterol check however that result is still pending. Were hoping to see an improvement in  cholesterol with the addition of fenofibrate. She is retired from [employer] job and now works full-time in [employer].   10/13/2015  [name] returns today for follow-up. She is without any complaints. She is overall doing well and continues to be active. She denies any recurrent atrial fibrillation. She is maintaining sinus rhythm at 63 with a QTC of 431  ms. She is aware to avoid certain medications with her flecainide which is currently 75 mg twice a day. Cholesterol is been well controlled and is due for repeat check.  10/14/2016  [name] returns today for follow-up. She recently broke the great toe on her right foot. She is currently in a walking boot. Hopefully that will come off next week. She denies any chest pain or worsening shortness of breath. She says her PVCs are well controlled on flecainide. We reviewed her most recent lipid profile which demonstrates total cholesterol of 144, HDL-C 30, triglycerides 188, LDL-C of 86. LDL-P of 1194, non-HDL-C of 114 and ApoB of 82.    PMHx:  Past Medical History:  Diagnosis Date  . Breast CA ([place])    right - radiation & surgery  . Diverticulosis   . Family history of heart disease   . Gastroparesis    mild  . GERD (gastroesophageal reflux disease)   . History of nuclear stress test 12/2011   bruce protocol; no evidence of ischemia, EF 79%, normal pattern of perfusion  . Hyperchloremia   . Hyperlipidemia   . Hypertension   . MVP (mitral valve prolapse)    last echo 07/1997 - normal LV size/function, mild MVP, mild MR  . PVC (premature ventricular contraction)     Past Surgical History:  Procedure Laterality Date  . ABDOMINAL HYSTERECTOMY  03/2003  . APPENDECTOMY  01/2002  . CARDIAC CATHETERIZATION  10/13/2009   no significant fixed obstructive CAD, normral ramus intermediate/circumflex/RCA; normal LV function (Dr. Corky Downs)  . MASTECTOMY Right 2006   right lumpectomy  . TONSILLECTOMY  1969    FAMHx:  Family History  Problem Relation Age of Onset  . Diabetes Maternal Grandmother   . Heart failure Maternal Grandmother   . Emphysema Maternal Grandfather   . Breast cancer Paternal Grandmother   . Heart disease Paternal Grandfather   . Alzheimer's disease Mother   . Heart failure Father   . Hypertension Father     also murmur, hyperlipidemia, MI, CABG age 89  . Diabetes Brother    . Heart disease Brother 8    MI at 73  . Hypertension Brother   . Hyperlipidemia Sister   . Breast cancer Maternal Aunt     PVCs  . Asthma Child     x2    SOCHx:   reports that she has never smoked. She has never used smokeless tobacco. She reports that she drinks alcohol. She reports that she does not use drugs.  ALLERGIES:  Allergies  Allergen Reactions  . Amoxicillin     REACTION: rash  . Chocolate   . Crestor [Rosuvastatin]     myalgias  . Erythromycin     REACTION: Nausea    ROS: A comprehensive review of systems was negative.  HOME MEDS: Current Outpatient Prescriptions  Medication Sig Dispense Refill  . aspirin EC 81 MG tablet Take 81 mg by mouth daily.      Abel Presto CRANBERRY GUMMIES PO Take 2 tablets by mouth daily.    Marland Kitchen esomeprazole (NEXIUM) 40 MG capsule Take 40 mg by mouth  daily.     . ezetimibe (ZETIA) 10 MG tablet Take 1 tablet (10 mg total) by mouth daily. 30 tablet 5  . fenofibrate (TRICOR) 145 MG tablet TAKE 1 TABLET ONCE DAILY. 30 tablet 6  . fexofenadine (ALLEGRA) 180 MG tablet Take 180 mg by mouth every evening.    . fish oil-omega-3 fatty acids 1000 MG capsule Take 1 g by mouth 2 (two) times daily.      . flecainide (TAMBOCOR) 50 MG tablet Take 1.5 tablets (75 mg total) by mouth 2 (two) times daily. 270 tablet 3  . fluticasone (FLONASE) 50 MCG/ACT nasal spray Place 1 spray into both nostrils daily.    . Glucosamine-Chondroitin (COSAMIN DS PO) Take 1 tablet by mouth daily.    . hydrochlorothiazide (HYDRODIURIL) 25 MG tablet TAKE 1 TABLET DAILY. 30 tablet 6  . hyoscyamine (LEVSIN SL) 0.125 MG SL tablet PLACE 1-2 TABLETS UNDER TONGUE EVERY FOUR HOURS AS NEEDED FOR ABDOMINAL PAIN. 30 tablet 3  . Multiple Vitamins-Minerals (MULTIPLE VITAMINS/WOMENS PO) Take 2 tablets by mouth daily.     Marland Kitchen PARoxetine (PAXIL-CR) 12.5 MG 24 hr tablet Take 12.5 mg by mouth every morning.      . simvastatin (ZOCOR) 20 MG tablet Take 1 tablet (20 mg total) by mouth daily at 6  PM. 30 tablet 11   No current facility-administered medications for this visit.     LABS/IMAGING: No results found for this or any previous visit (from the past 48 hour(s)). No results found.  VITALS: BP 118/88   Pulse 63   Ht 5' 1.5" (1.562 m)   Wt 171 lb (77.6 kg)   BMI 31.79 kg/m   EXAM: General appearance: alert and no distress Neck: no adenopathy, no carotid bruit, no JVD, supple, symmetrical, trachea midline and thyroid not enlarged, symmetric, no tenderness/mass/nodules Heart: regular rate and rhythm, S1, S2 normal, no murmur, click, rub or gallop Extremities: extremities normal, atraumatic, no cyanosis or edema Pulses: 2+ and symmetric  EKG: Normal sinus rhythm at 63, QTC 435 ms  ASSESSMENT: 1. PVCs-markedly improved on flecainde. 2. Hyperlipidemia 3. GERD  PLAN: 1.   Mrs. Skoczylas is feeling well other than recent fracture of her right great toe. She is not able to exercise at this time. She will be due for repeat exercise treadmill testing when she can run on a treadmill. This is for ongoing evaluation on flecainide. It's been 2 years since her last stress test. She denies any chest pain or shortness of breath. Cholesterol is at goal. Continue her current medications. Will plan to see her back annually or sooner as necessary.  Pixie Casino, MD, [facility] Attending Cardiologist [place] 10/14/2016, 8:38 AM

## 2016-10-14 NOTE — Patient Instructions (Signed)
Your physician wants you to follow-up in: ONE YEAR with Dr. Debara Pickett. You will receive a reminder letter in the mail two months in advance. If you don't receive a letter, please call our office to schedule the follow-up appointment.  -- you will need fasting lab work and an exercise tolerance test prior to your next visit - you will be contacted to schedule this test when it is due

## 2016-10-18 ENCOUNTER — Other Ambulatory Visit: Payer: Self-pay | Admitting: Internal Medicine

## 2016-10-26 ENCOUNTER — Other Ambulatory Visit: Payer: Self-pay | Admitting: Obstetrics and Gynecology

## 2016-10-27 LAB — CYTOLOGY - PAP

## 2016-11-02 ENCOUNTER — Other Ambulatory Visit: Payer: Self-pay | Admitting: Internal Medicine

## 2016-11-02 NOTE — Telephone Encounter (Signed)
Rx request sent to pharmacy.  

## 2016-11-09 ENCOUNTER — Encounter: Payer: Self-pay | Admitting: Dietician

## 2016-11-09 ENCOUNTER — Encounter: Payer: PRIVATE HEALTH INSURANCE | Attending: Family Medicine | Admitting: Dietician

## 2016-11-09 DIAGNOSIS — E118 Type 2 diabetes mellitus with unspecified complications: Secondary | ICD-10-CM

## 2016-11-09 DIAGNOSIS — E119 Type 2 diabetes mellitus without complications: Secondary | ICD-10-CM | POA: Insufficient documentation

## 2016-11-09 DIAGNOSIS — Z713 Dietary counseling and surveillance: Secondary | ICD-10-CM | POA: Insufficient documentation

## 2016-11-09 NOTE — Progress Notes (Signed)

## 2016-11-16 ENCOUNTER — Encounter: Payer: PRIVATE HEALTH INSURANCE | Attending: Family Medicine | Admitting: Dietician

## 2016-11-16 DIAGNOSIS — Z713 Dietary counseling and surveillance: Secondary | ICD-10-CM | POA: Diagnosis not present

## 2016-11-16 DIAGNOSIS — E119 Type 2 diabetes mellitus without complications: Secondary | ICD-10-CM | POA: Diagnosis not present

## 2016-11-16 DIAGNOSIS — E118 Type 2 diabetes mellitus with unspecified complications: Secondary | ICD-10-CM

## 2016-11-16 NOTE — Progress Notes (Signed)

## 2016-11-23 ENCOUNTER — Encounter: Payer: PRIVATE HEALTH INSURANCE | Admitting: Dietician

## 2016-11-23 DIAGNOSIS — E118 Type 2 diabetes mellitus with unspecified complications: Secondary | ICD-10-CM

## 2016-11-23 DIAGNOSIS — Z713 Dietary counseling and surveillance: Secondary | ICD-10-CM | POA: Diagnosis not present

## 2016-11-23 NOTE — Progress Notes (Signed)
Patient was seen on 11/23/16 for the third of a series of three diabetes self-management courses at [facility].   Catalina Gravel the amount of activity recommended for healthy living . Describe activities suitable for individual needs . Identify ways to regularly incorporate activity into daily life . Identify barriers to activity and ways to over come these barriers  Identify diabetes medications being personally used and their primary action for lowering glucose and possible side effects . Describe role of stress on blood glucose and develop strategies to address psychosocial issues . Identify diabetes complications and ways to prevent them  Explain how to manage diabetes during illness . Evaluate success in meeting personal goal . Establish 2-3 goals that they will plan to diligently work on until they return for the  73-month follow-up visit  Goals:   I will count my carb choices at most meals and snacks  I will be active 30 minutes or more 5 times a week  To help manage stress I will  relax at least 3 times a week  Your patient has identified these potential barriers to change:  Motivation Time  Your patient has identified their diabetes self-care support plan as  Family Support On-line Resources Plan:  Attend Support Group as desired

## 2017-02-08 ENCOUNTER — Other Ambulatory Visit: Payer: Self-pay | Admitting: Internal Medicine

## 2017-03-02 ENCOUNTER — Other Ambulatory Visit: Payer: Self-pay | Admitting: Internal Medicine

## 2017-07-04 ENCOUNTER — Other Ambulatory Visit: Payer: Self-pay | Admitting: Internal Medicine

## 2017-08-15 ENCOUNTER — Other Ambulatory Visit: Payer: Self-pay | Admitting: *Deleted

## 2017-08-15 DIAGNOSIS — E785 Hyperlipidemia, unspecified: Secondary | ICD-10-CM

## 2017-08-15 DIAGNOSIS — Z5181 Encounter for therapeutic drug level monitoring: Secondary | ICD-10-CM

## 2017-08-15 DIAGNOSIS — Z79899 Other long term (current) drug therapy: Secondary | ICD-10-CM

## 2017-08-26 ENCOUNTER — Other Ambulatory Visit: Payer: Self-pay | Admitting: Internal Medicine

## 2017-08-26 NOTE — Telephone Encounter (Signed)
REFILL 

## 2017-08-31 ENCOUNTER — Telehealth: Payer: Self-pay | Admitting: Internal Medicine

## 2017-08-31 MED ORDER — HYOSCYAMINE SULFATE 0.125 MG SL SUBL: SUBLINGUAL_TABLET | SUBLINGUAL | 3 refills | Status: DC

## 2017-08-31 NOTE — Telephone Encounter (Signed)
Patient states she is about to run out of medication hyoscyamine and would like a refill. Patient states her upper abd spasms have been increasing and so she has had to take the medication more. Patient scheduled for F/U on 5.3.19.

## 2017-08-31 NOTE — Telephone Encounter (Signed)
Refilled Levsin

## 2017-09-02 ENCOUNTER — Other Ambulatory Visit: Payer: Self-pay | Admitting: Internal Medicine

## 2017-09-02 NOTE — Telephone Encounter (Signed)
REFILL 

## 2017-09-27 ENCOUNTER — Other Ambulatory Visit: Payer: Self-pay | Admitting: Internal Medicine

## 2017-10-07 ENCOUNTER — Telehealth (HOSPITAL_COMMUNITY): Payer: Self-pay

## 2017-10-07 NOTE — Telephone Encounter (Signed)
Encounter complete. 

## 2017-10-12 ENCOUNTER — Ambulatory Visit (HOSPITAL_COMMUNITY)
Admission: RE | Admit: 2017-10-12 | Discharge: 2017-10-12 | Disposition: A | Discharge status: Home / Self Care | Payer: PRIVATE HEALTH INSURANCE | Source: Ambulatory Visit | Attending: Cardiovascular Disease | Admitting: Cardiovascular Disease

## 2017-10-12 DIAGNOSIS — Z5181 Encounter for therapeutic drug level monitoring: Secondary | ICD-10-CM | POA: Diagnosis present

## 2017-10-12 DIAGNOSIS — I493 Ventricular premature depolarization: Secondary | ICD-10-CM | POA: Diagnosis not present

## 2017-10-12 DIAGNOSIS — Z79899 Other long term (current) drug therapy: Secondary | ICD-10-CM

## 2017-10-12 LAB — EXERCISE TOLERANCE TEST
Estimated workload: 10.1 METS
Exercise duration (min): 9 min
Exercise duration (sec): 0 s
MPHR: 157 {beats}/min
Peak HR: 139 {beats}/min
Percent HR: 88 %
RPE: 17
Rest HR: 63 {beats}/min

## 2017-10-14 ENCOUNTER — Ambulatory Visit (INDEPENDENT_AMBULATORY_CARE_PROVIDER_SITE_OTHER): Payer: PRIVATE HEALTH INSURANCE | Admitting: Internal Medicine

## 2017-10-14 ENCOUNTER — Encounter: Payer: Self-pay | Admitting: Internal Medicine

## 2017-10-14 VITALS — BP 132/70 | HR 64 | Ht 62.0 in | Wt 166.2 lb

## 2017-10-14 DIAGNOSIS — R1011 Right upper quadrant pain: Secondary | ICD-10-CM | POA: Diagnosis not present

## 2017-10-14 MED ORDER — HYOSCYAMINE SULFATE 0.125 MG SL SUBL: SUBLINGUAL_TABLET | SUBLINGUAL | 11 refills | Status: DC

## 2017-10-14 NOTE — Patient Instructions (Signed)
We have sent the following medications to your pharmacy for you to pick up at your convenience:  Levsin  Please follow up as needed

## 2017-10-14 NOTE — Progress Notes (Signed)
HISTORY OF PRESENT ILLNESS:  [name] is a 62 y.o. female with a history of hypertension, hyperlipidemia, obesity, breast cancer, GERD requiring PPI, and IBS. She has been evaluated on multiple occasions for various GI complaints with extensive workups as nicely outlined in her last office visit 04/01/2016. Please see that dictation. At that time the issues were recurrent right upper quadrant pain, fatty liver, GERD, questionable left kidney lesion, and a history of gastroparesis. For her pain she was using Levsin with good results. She's done fairly well since that time except for mid-March should she develop recurrent problems with severe right upper quadrant pain requiring antispasmodics for control. Problems resolved by the end of March. No recurrence since. Contact the office who recommended office evaluation. No vomiting. Bowel habits are okay. No bleeding. Her last upper endoscopy 2014. Last complete colonoscopy 2014.  REVIEW OF SYSTEMS:  All non-GI ROS negative Unless otherwise stated in the history of present illness except for arthritis, heart rhythm change, heart murmur, fatigue  Past Medical History:  Diagnosis Date  . Breast CA ([place])    right - radiation & surgery  . Diabetes ([facility])    Type II, diet control  . Diverticulosis   . Family history of heart disease   . Gastroparesis    mild  . GERD (gastroesophageal reflux disease)   . History of nuclear stress test 12/2011   bruce protocol; no evidence of ischemia, EF 79%, normal pattern of perfusion  . Hyperchloremia   . Hyperlipidemia   . Hypertension   . MVP (mitral valve prolapse)    last echo 07/1997 - normal LV size/function, mild MVP, mild MR  . PVC (premature ventricular contraction)   . Vitamin D deficiency     Past Surgical History:  Procedure Laterality Date  . ABDOMINAL HYSTERECTOMY  03/2003  . APPENDECTOMY  01/2002  . CARDIAC CATHETERIZATION  10/13/2009   no significant fixed obstructive CAD, normral ramus  intermediate/circumflex/RCA; normal LV function (Dr. Corky Downs)  . MASTECTOMY Right 2006   right lumpectomy  . [place]  reports that she has never smoked. She has never used smokeless tobacco. She reports that she drinks alcohol. She reports that she does not use drugs.  family history includes Alzheimer's disease in her mother; Asthma in her child; Breast cancer in her maternal aunt and paternal grandmother; Diabetes in her brother and maternal grandmother; Emphysema in her maternal grandfather; Heart disease in her paternal grandfather; Heart disease (age of onset: 17) in her brother; Heart failure in her father and maternal grandmother; Hyperlipidemia in her sister; Hypertension in her brother and father.  Allergies  Allergen Reactions  . Amoxicillin     REACTION: rash  . Chocolate   . Crestor [Rosuvastatin]     myalgias  . Erythromycin     REACTION: Nausea       PHYSICAL EXAMINATION: Vital signs: BP 132/70   Pulse 64   Ht 5\' 2"  (1.575 m)   Wt 166 lb 4 oz (75.4 kg)   BMI 30.41 kg/m   Constitutional: pleasant,generally well-appearing, no acute distress Psychiatric: alert and oriented x3, cooperative Eyes: extraocular movements intact, anicteric, conjunctiva pink Mouth: oral pharynx moist, no lesions Neck: supple no lymphadenopathy Cardiovascular: heart regular rate and rhythm, no murmur Lungs: clear to auscultation bilaterally Abdomen: soft, nontender, nondistended, no obvious ascites, no peritoneal signs, normal bowel sounds, no organomegaly Rectal:omitted Extremities: no lower extremity edema bilaterally Skin: no lesions on  visible extremities Neuro: No focal deficits. Cranial nerves intact  ASSESSMENT:  #1. Chronic recurrent right upper quadrant pain. Seems to respond antispasmodics. No problems in greater than one month #2. GERD. Controlled with Nexium #3. Colon cancer screening 2014. Negative for  neoplasia   PLAN:  #1. Prescribed Levsin sublingual. Use as needed. Multiple refills #2. Reflux precautions #3. Weight loss #4. Continue PPI #5. Surveillance colonoscopy around 2024 #6. Interval follow-up as needed

## 2017-10-20 LAB — LIPOPROTEIN ANALYSIS BY NMR
HDL Particle Number: 30.8 umol/L (ref >=30.5)
LDL Particle Number: 1151 nmol/L — ABNORMAL HIGH (ref <1000)
LDL Size: 19.8 nm — ABNORMAL LOW (ref >20.5)
LP-IR Score: 87 — ABNORMAL HIGH (ref <=45)
Small LDL Particle Number: 853 nmol/L — ABNORMAL HIGH (ref <=527)

## 2017-10-21 ENCOUNTER — Other Ambulatory Visit: Payer: Self-pay | Admitting: Internal Medicine

## 2017-10-21 NOTE — Telephone Encounter (Signed)
Rx(s) sent to pharmacy electronically.  

## 2017-10-26 ENCOUNTER — Encounter: Payer: Self-pay | Admitting: Internal Medicine

## 2017-10-26 ENCOUNTER — Ambulatory Visit (INDEPENDENT_AMBULATORY_CARE_PROVIDER_SITE_OTHER): Payer: PRIVATE HEALTH INSURANCE | Admitting: Internal Medicine

## 2017-10-26 VITALS — BP 126/87 | HR 54 | Ht 62.0 in | Wt 167.2 lb

## 2017-10-26 DIAGNOSIS — Z5181 Encounter for therapeutic drug level monitoring: Secondary | ICD-10-CM

## 2017-10-26 DIAGNOSIS — R0602 Shortness of breath: Secondary | ICD-10-CM | POA: Diagnosis not present

## 2017-10-26 DIAGNOSIS — I493 Ventricular premature depolarization: Secondary | ICD-10-CM

## 2017-10-26 DIAGNOSIS — Z79899 Other long term (current) drug therapy: Secondary | ICD-10-CM | POA: Diagnosis not present

## 2017-10-26 DIAGNOSIS — R9431 Abnormal electrocardiogram [ECG] [EKG]: Secondary | ICD-10-CM

## 2017-10-26 NOTE — Progress Notes (Signed)
OFFICE NOTE  Chief Complaint:  Follow-up treadmill stress testing  Primary Care Physician: [name] Lass, MD  HPI:  [name] is a 62 year old female previously followed by Dr. Rex [name] with a history of lower extremity edema recently and chest discomfort in the spring which she underwent a stress test for that was negative for ischemia with a preserved EF of 71%. She has had no further episodes of chest pain, and her edema has all but resolved, however, she stayed on the hydrochlorothiazide 25 mg daily. She also has hypertension which has been well controlled and a history of breast cancer but is now cancer free. Recently she had a fall and fractured her right humerus and has not been as active from that but has since started to get back to some more activity. 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 She recently had laboratory work which shows a well controlled lipid profile, total cholesterol 185, triglycerides 286, HDL 36 and LDL 92. This is on fenofibrate. She has had a history of elevated triglycerides in the past, but is nondiabetic.  She was doing very well when I saw her last week and recommended a dental holiday. She was not having any significant palpitations or PVCs that she mentioned. Less than a week after her office visit, she reported a day where she felt "terrible all day". She reported having numerous PVCs throughout the day and she says that that completely "zaps her energy". The next day she felt very fatigued and today, 2 days later she still feels quite weakened. Her EKG in the office today shows no evidence of PVCs.  This is apparently been an ongoing problem for years for her, was advanced coming paroxysmally and being associated with extreme fatigue.  I had referred her to see Dr. Cristopher [name] with cardiac electrophysiology. He recommended starting her on low-dose flecainide. Since starting that she has reported feeling  much better. She was placed on a longer monitor which showed essentially sinus rhythm and 1 atrial couplet which she noted a skipped beat otherwise no paroxysmal atrial fibrillation, PVC's, nonsustained VT or other arrhythmias were noted. Dr. Lovena [name] recommended weaning off atenolol and she is currently on flecainide 75 mg twice daily.  [name] returns today for followup. She reports her palpitations are essentially gone and flecainide. She denies any chest pain. Unfortunately she's had recurrent sinusitis and is currently on doxycycline. She's had issues with her sinuses for a long time and now is also having problems with her years. I recommended she see an ear nose and throat doctor. She has had improvement in her lipid profile on current medications. Her LDL particle number is down to 1480, from 1824. Calculated LDL is down to 80 from 116 and triglycerides are mildly elevated still at 191.  [name] returns today for follow-up of her palpitations. She reports a been generally well controlled on flecainide. She is currently on senna 5 mg twice daily. Her last stress test was in 2013. She denies any chest pain or worsening shortness of breath. On aspirin for anticoagulation, given her young age and infrequency of symptoms.b  I saw [name] back in the office today for follow-up. Overall she reports very good control of her palpitations. She denies any chest pain or shortness of breath. She reports her reflux symptoms are well controlled. She recently had a cholesterol check however that result is still pending. Were hoping to see an  improvement in cholesterol with the addition of fenofibrate. She is retired from [employer] job and now works full-time in [employer].   10/13/2015  [name] returns today for follow-up. She is without any complaints. She is overall doing well and continues to be active. She denies any recurrent atrial fibrillation. She is maintaining sinus rhythm at 63  with a QTC of 431 ms. She is aware to avoid certain medications with her flecainide which is currently 75 mg twice a day. Cholesterol is been well controlled and is due for repeat check.  10/14/2016  [name] returns today for follow-up. She recently broke the great toe on her right foot. She is currently in a walking boot. Hopefully that will come off next week. She denies any chest pain or worsening shortness of breath. She says her PVCs are well controlled on flecainide. We reviewed her most recent lipid profile which demonstrates total cholesterol of 144, HDL-C 30, triglycerides 188, LDL-C of 86. LDL-P of 1194, non-HDL-C of 114 and ApoB of 82.    10/26/2017  [name] returns today for follow-up.  She underwent repeat lipid particle testing which showed LDL P of 1151, which is stable compared to her values last year.  She reports recently an increase in some PVCs, particularly exertionally and some recent shortness of breath when she was moving chairs at a funeral.  Blood pressure is well controlled.  She underwent a routine treadmill stress test given her flecainide use.  This did show some borderline abnormalities including 2 mm of J-point depression with upsloping depression at peak exercise and some mild horizontal ST segment depression less than 1 mm.  She was able to exercise for 9 minutes and 10 metabolic equivalents.  She did report shortness of breath and fatigue during the study.  These are new symptoms for her.  Is not clear whether this could be angina on the study is borderline abnormal.  PMHx:  Past Medical History:  Diagnosis Date  . Breast CA ([place])    right - radiation & surgery  . Diabetes ([facility])    Type II, diet control  . Diverticulosis   . Family history of heart disease   . Gastroparesis    mild  . GERD (gastroesophageal reflux disease)   . History of nuclear stress test 12/2011   bruce protocol; no evidence of ischemia, EF 79%, normal pattern of perfusion  .  Hyperchloremia   . Hyperlipidemia   . Hypertension   . MVP (mitral valve prolapse)    last echo 07/1997 - normal LV size/function, mild MVP, mild MR  . PVC (premature ventricular contraction)   . Vitamin D deficiency     Past Surgical History:  Procedure Laterality Date  . ABDOMINAL HYSTERECTOMY  03/2003  . APPENDECTOMY  01/2002  . CARDIAC CATHETERIZATION  10/13/2009   no significant fixed obstructive CAD, normral ramus intermediate/circumflex/RCA; normal LV function (Dr. Corky Downs)  . MASTECTOMY Right 2006   right lumpectomy  . TONSILLECTOMY  1969    FAMHx:  Family History  Problem Relation Age of Onset  . Diabetes Maternal Grandmother   . Heart failure Maternal Grandmother   . Emphysema Maternal Grandfather   . Breast cancer Paternal Grandmother   . Heart disease Paternal Grandfather   . Alzheimer's disease Mother   . Heart failure Father   . Hypertension Father        also murmur, hyperlipidemia, MI, CABG age 71  . Diabetes Brother   . Heart disease  Brother 2       MI at 44  . Hypertension Brother   . Hyperlipidemia Sister   . Breast cancer Maternal Aunt        PVCs  . Asthma Child        x2    SOCHx:   reports that she has never smoked. She has never used smokeless tobacco. She reports that she drinks alcohol. She reports that she does not use drugs.  ALLERGIES:  Allergies  Allergen Reactions  . Amoxicillin     REACTION: rash  . Chocolate   . Crestor [Rosuvastatin]     myalgias  . Erythromycin     REACTION: Nausea    ROS: A comprehensive review of systems was negative.  HOME MEDS: Current Outpatient Medications  Medication Sig Dispense Refill  . aspirin EC 81 MG tablet Take 81 mg by mouth daily.      Abel Presto CRANBERRY GUMMIES PO Take 2 tablets by mouth daily.    . cholecalciferol (VITAMIN D) 1000 units tablet Take 1,000 Units by mouth daily.    Marland Kitchen esomeprazole (NEXIUM) 40 MG capsule Take 40 mg by mouth daily.     Marland Kitchen ezetimibe (ZETIA) 10 MG tablet TAKE 1  TABLET ONCE DAILY. 30 tablet 0  . fenofibrate (TRICOR) 145 MG tablet TAKE 1 TABLET ONCE DAILY. 30 tablet 0  . fexofenadine (ALLEGRA) 180 MG tablet Take 180 mg by mouth every evening.    . fish oil-omega-3 fatty acids 1000 MG capsule Take 1 g by mouth 2 (two) times daily.      . flecainide (TAMBOCOR) 50 MG tablet TAKE 1&1/2 TABLETS TWICE DAILY. 90 tablet 0  . fluticasone (FLONASE) 50 MCG/ACT nasal spray Place 1 spray into both nostrils daily.    . Glucosamine-Chondroitin (COSAMIN DS PO) Take 1 tablet by mouth daily.    . hydrochlorothiazide (HYDRODIURIL) 25 MG tablet Take 1 tablet (25 mg total) by mouth daily. 90 tablet 2  . hyoscyamine (LEVSIN SL) 0.125 MG SL tablet PLACE 1-2 TABLETS UNDER TONGUE EVERY FOUR HOURS AS NEEDED FOR ABDOMINAL PAIN. 30 tablet 11  . Multiple Vitamins-Minerals (MULTIPLE VITAMINS/WOMENS PO) Take 2 tablets by mouth daily.     Marland Kitchen PARoxetine (PAXIL-CR) 12.5 MG 24 hr tablet Take 12.5 mg by mouth every morning.      . simvastatin (ZOCOR) 20 MG tablet Take 1 tablet (20 mg total) by mouth daily at 6 PM. KEEP OV. 90 tablet 0   No current facility-administered medications for this visit.     LABS/IMAGING: No results found for this or any previous visit (from the past 48 hour(s)). No results found.  VITALS: BP (!) 126/8   Pulse (!) 54   Ht 5\' 2"  (1.575 m)   Wt 167 lb 3.2 oz (75.8 kg)   BMI 30.58 kg/m   EXAM: General appearance: alert and no distress Neck: no carotid bruit, no JVD and thyroid not enlarged, symmetric, no tenderness/mass/nodules Lungs: clear to auscultation bilaterally Heart: regular rate and rhythm, S1, S2 normal, no murmur, click, rub or gallop Abdomen: soft, non-tender; bowel sounds normal; no masses,  no organomegaly and obese Extremities: extremities normal, atraumatic, no cyanosis or edema Pulses: 2+ and symmetric Skin: Skin color, texture, turgor normal. No rashes or lesions Neurologic: Grossly normal Psych: Pleasant  EKG: Bradycardia 58, low  voltage QRS-personally reviewed  ASSESSMENT: 1. Progressive dyspnea on exertion and fatigue 2. Borderline abnormal exercise treadmill stress test 3. PVCs-markedly improved on flecainde. 4. Hyperlipidemia 5. GERD  PLAN:  1.   Mrs. Taha had a recent increase in her PVCs with shortness of breath and fatigue on exertion.  Her exercise treadmill stress test is borderline abnormal and may indicate underlying ischemia.  Based on her new symptom pattern I recommend further evaluation for ischemia, especially since she is on flecainide therapy.  I would recommend a CT coronary angiogram with FFR.  If this is abnormal further follow-up with heart catheterization is recommended.  If this is negative, given her recent increase in PVCs, will likely uptitrate her flecainide to see if this improves her symptoms.  She is encouraged to continue to work on exercise and weight loss.  Follow-up with me after studies.  Pixie Casino, MD, [facility], [place] Director of the [facility]  Cardiovascular [facility] Clinic Diplomate of the American Board of Clinical Lipidology Attending Cardiologist  Direct Dial: [phone]  Fax: [phone]  Website:  [email or web address] Raheem Kolbe 10/26/2017, 9:05 AM

## 2017-10-26 NOTE — Patient Instructions (Addendum)
Dr. Debara Pickett has ordered a coronary CT with FFR - this is done at [facility] (instructions below).  You will be contacted to schedule this once it is approved w/your insurance   Your physician recommends that you schedule a follow-up appointment with Dr. Debara Pickett after your test.      Please arrive at the [facility] main entrance of [facility] at ________ AM/PM (30-45 minutes prior to test start time)  [facility] [place], [place] [address] [phone]  Proceed to the [facility] Radiology Department (First Floor).  Please follow these instructions carefully (unless otherwise directed):  You will need blood work Artist) one week prior to test.  On the Night Before the Test: . Drink plenty of water. . Do not consume any caffeinated/decaffeinated beverages or chocolate 12 hours prior to your test. . Do not take any antihistamines 12 hours prior to your test.  On the Day of the Test: . Drink plenty of water. Do not drink any water within one hour of the test. . Do not eat any food 4 hours prior to the test. . You may take your regular medications prior to the test.  After the Test: . Drink plenty of water. . After receiving IV contrast, you may experience a mild flushed feeling. This is normal. . On occasion, you may experience a mild rash up to 24 hours after the test. This is not dangerous. If this occurs, you can take Benadryl 25 mg and increase your fluid intake. . If you experience trouble breathing, this can be serious. If it is severe call 911 IMMEDIATELY. If it is mild, please call our office. . If you take any of these medications: Glipizide/Metformin, Avandament, Glucavance, please do not take 48 hours after completing test.

## 2017-10-28 ENCOUNTER — Other Ambulatory Visit: Payer: Self-pay | Admitting: Internal Medicine

## 2017-11-08 ENCOUNTER — Other Ambulatory Visit: Payer: Self-pay | Admitting: Internal Medicine

## 2017-11-08 NOTE — Telephone Encounter (Signed)
Rx sent to pharmacy   

## 2017-11-09 ENCOUNTER — Telehealth: Payer: Self-pay | Admitting: Internal Medicine

## 2017-11-09 MED ORDER — HYDROCHLOROTHIAZIDE 25 MG PO TABS: 25.0000 mg | ORAL_TABLET | Freq: Every day | ORAL | 2 refills | Status: DC

## 2017-11-09 MED ORDER — EZETIMIBE 10 MG PO TABS: 10.0000 mg | ORAL_TABLET | Freq: Every day | ORAL | 2 refills | Status: DC

## 2017-11-09 MED ORDER — FLECAINIDE ACETATE 50 MG PO TABS: 75.0000 mg | ORAL_TABLET | Freq: Two times a day (BID) | ORAL | 2 refills | Status: DC

## 2017-11-09 MED ORDER — SIMVASTATIN 20 MG PO TABS: 20.0000 mg | ORAL_TABLET | Freq: Every day | ORAL | 2 refills | Status: DC

## 2017-11-09 NOTE — Telephone Encounter (Signed)
Spoke with patient and she voiced understanding; she stated she does not need her medicine this minute but would like for a few refills to be placed on file.

## 2017-11-09 NOTE — Telephone Encounter (Signed)
New message    Patient is requesting additional refills be added to her medications.   ezetimibe (ZETIA) 10 MG tablet flecainide (TAMBOCOR) 50 MG tablet hydrochlorothiazide (HYDRODIURIL) 25 MG tablet simvastatin (ZOCOR) 20 MG tablet

## 2017-11-21 ENCOUNTER — Other Ambulatory Visit: Payer: Self-pay | Admitting: Internal Medicine

## 2017-12-06 LAB — BASIC METABOLIC PANEL
BUN/Creatinine Ratio: 21 (ref 12–28)
BUN: 20 mg/dL (ref 8–27)
CO2: 25 mmol/L (ref 20–29)
Calcium: 10.1 mg/dL (ref 8.7–10.3)
Chloride: 104 mmol/L (ref 96–106)
Creatinine, Ser: 0.94 mg/dL (ref 0.57–1.00)
GFR calc Af Amer: 75 mL/min/{1.73_m2} (ref >59)
GFR calc non Af Amer: 65 mL/min/{1.73_m2} (ref >59)
Glucose: 152 mg/dL — ABNORMAL HIGH (ref 65–99)
Potassium: 4.1 mmol/L (ref 3.5–5.2)
Sodium: 142 mmol/L (ref 134–144)

## 2017-12-13 ENCOUNTER — Encounter (HOSPITAL_COMMUNITY): Payer: Self-pay

## 2017-12-13 ENCOUNTER — Ambulatory Visit (HOSPITAL_COMMUNITY)
Admission: RE | Admit: 2017-12-13 | Discharge: 2017-12-13 | Disposition: A | Discharge status: Home / Self Care | Payer: PRIVATE HEALTH INSURANCE | Source: Ambulatory Visit | Attending: Internal Medicine | Admitting: Internal Medicine

## 2017-12-13 DIAGNOSIS — R0609 Other forms of dyspnea: Secondary | ICD-10-CM

## 2017-12-13 DIAGNOSIS — I493 Ventricular premature depolarization: Secondary | ICD-10-CM | POA: Diagnosis not present

## 2017-12-13 DIAGNOSIS — R9431 Abnormal electrocardiogram [ECG] [EKG]: Secondary | ICD-10-CM | POA: Diagnosis not present

## 2017-12-13 DIAGNOSIS — R0602 Shortness of breath: Secondary | ICD-10-CM | POA: Diagnosis not present

## 2017-12-13 DIAGNOSIS — R9439 Abnormal result of other cardiovascular function study: Secondary | ICD-10-CM

## 2017-12-13 MED ORDER — NITROGLYCERIN 0.4 MG SL SUBL
SUBLINGUAL_TABLET | SUBLINGUAL | Status: AC
  Filled 2017-12-13: qty 2

## 2017-12-13 MED ORDER — NITROGLYCERIN 0.4 MG SL SUBL
0.8000 mg | SUBLINGUAL_TABLET | Freq: Once | SUBLINGUAL | Status: AC
Start: 2017-12-13 — End: 2017-12-13
  Administered 2017-12-13: 0.8 mg via SUBLINGUAL
  Filled 2017-12-13: qty 25

## 2017-12-13 MED ORDER — IOPAMIDOL (ISOVUE-370) INJECTION 76%
INTRAVENOUS | Status: AC
  Administered 2017-12-13: 80 mL
  Filled 2017-12-13: qty 100

## 2017-12-13 NOTE — Progress Notes (Signed)
CT complete. Patient eating crackers and drinking coke. Complains of headache.

## 2017-12-13 NOTE — Progress Notes (Signed)
Patient denies any complaints at this time. Patient ambulatory out of department with steady gait noted. Family with patient.

## 2017-12-30 ENCOUNTER — Telehealth: Payer: Self-pay | Admitting: Internal Medicine

## 2017-12-30 NOTE — Telephone Encounter (Signed)
New Message    Pt is calling with questions about her ct scan and wanting to know the next steps that needs to be taken. Please call

## 2017-12-30 NOTE — Telephone Encounter (Signed)
Patient has several questions and wanted for her husband to come with her to appointment. Scheduled appointment with Dr Debara Pickett lipid clinic 01/12/18.

## 2018-01-12 ENCOUNTER — Encounter: Payer: Self-pay | Admitting: Internal Medicine

## 2018-01-12 ENCOUNTER — Ambulatory Visit (INDEPENDENT_AMBULATORY_CARE_PROVIDER_SITE_OTHER): Payer: PRIVATE HEALTH INSURANCE | Admitting: Internal Medicine

## 2018-01-12 VITALS — BP 132/70 | HR 58 | Ht 62.0 in | Wt 168.0 lb

## 2018-01-12 DIAGNOSIS — I25119 Atherosclerotic heart disease of native coronary artery with unspecified angina pectoris: Secondary | ICD-10-CM

## 2018-01-12 DIAGNOSIS — E785 Hyperlipidemia, unspecified: Secondary | ICD-10-CM

## 2018-01-12 DIAGNOSIS — R0789 Other chest pain: Secondary | ICD-10-CM | POA: Diagnosis not present

## 2018-01-12 DIAGNOSIS — I493 Ventricular premature depolarization: Secondary | ICD-10-CM

## 2018-01-12 MED ORDER — NITROGLYCERIN 0.4 MG SL SUBL: 0.4000 mg | SUBLINGUAL_TABLET | SUBLINGUAL | 3 refills | Status: DC | PRN

## 2018-01-12 MED ORDER — FLECAINIDE ACETATE 100 MG PO TABS: 100.0000 mg | ORAL_TABLET | Freq: Two times a day (BID) | ORAL | 3 refills | Status: DC

## 2018-01-12 NOTE — Patient Instructions (Addendum)
Medication Instructions:   INCREASE flecainide to 100mg  twice daily USE nitroglycerin as needed for chest pain  Dr. Debara Pickett recommends Repatha (PCSK9). This is an injectable cholesterol medication. This medication will need prior approval with your insurance company, which we will work on. If the medication is not approved initially, we may need to do an appeal with your insurance. We will keep you updated on this process. This medication can be provided at some local pharmacies or be shipped to your from a specialty pharmacy.    Labwork:  FASTING lab work today  Testing/Procedures:  NONE  Follow-Up:  Your physician recommends that you schedule a follow-up appointment in: [place] for nurse visit EKG check  Your physician recommends that you schedule a follow-up appointment in: 2-3 months with Dr. Debara Pickett (lipid clinic)   If you need a refill on your cardiac medications before your next appointment, please call your pharmacy.  Any Other Special Instructions Will Be Listed Below (If Applicable).

## 2018-01-12 NOTE — Progress Notes (Signed)
OFFICE NOTE  Chief Complaint:  Follow-up CTA  Primary Care Physician: [name] Lass, MD  HPI:  [name] is a 62 year old female previously followed by Dr. Rex [name] with a history of lower extremity edema recently and chest discomfort in the spring which she underwent a stress test for that was negative for ischemia with a preserved EF of 71%. She has had no further episodes of chest pain, and her edema has all but resolved, however, she stayed on the hydrochlorothiazide 25 mg daily. She also has hypertension which has been well controlled and a history of breast cancer but is now cancer free. Recently she had a fall and fractured her right humerus and has not been as active from that but has since started to get back to some more activity. 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 She recently had laboratory work which shows a well controlled lipid profile, total cholesterol 185, triglycerides 286, HDL 36 and LDL 92. This is on fenofibrate. She has had a history of elevated triglycerides in the past, but is nondiabetic.  She was doing very well when I saw her last week and recommended a dental holiday. She was not having any significant palpitations or PVCs that she mentioned. Less than a week after her office visit, she reported a day where she felt "terrible all day". She reported having numerous PVCs throughout the day and she says that that completely "zaps her energy". The next day she felt very fatigued and today, 2 days later she still feels quite weakened. Her EKG in the office today shows no evidence of PVCs.  This is apparently been an ongoing problem for years for her, was advanced coming paroxysmally and being associated with extreme fatigue.  I had referred her to see Dr. Cristopher [name] with cardiac electrophysiology. He recommended starting her on low-dose flecainide. Since starting that she has reported feeling much better. She was  placed on a longer monitor which showed essentially sinus rhythm and 1 atrial couplet which she noted a skipped beat otherwise no paroxysmal atrial fibrillation, PVC's, nonsustained VT or other arrhythmias were noted. Dr. Lovena [name] recommended weaning off atenolol and she is currently on flecainide 75 mg twice daily.  [name] returns today for followup. She reports her palpitations are essentially gone and flecainide. She denies any chest pain. Unfortunately she's had recurrent sinusitis and is currently on doxycycline. She's had issues with her sinuses for a long time and now is also having problems with her years. I recommended she see an ear nose and throat doctor. She has had improvement in her lipid profile on current medications. Her LDL particle number is down to 1480, from 1824. Calculated LDL is down to 80 from 116 and triglycerides are mildly elevated still at 191.  [name] returns today for follow-up of her palpitations. She reports a been generally well controlled on flecainide. She is currently on senna 5 mg twice daily. Her last stress test was in 2013. She denies any chest pain or worsening shortness of breath. On aspirin for anticoagulation, given her young age and infrequency of symptoms.b  I saw [name] back in the office today for follow-up. Overall she reports very good control of her palpitations. She denies any chest pain or shortness of breath. She reports her reflux symptoms are well controlled. She recently had a cholesterol check however that result is still pending. Were hoping to see an improvement in  cholesterol with the addition of fenofibrate. She is retired from [employer] job and now works full-time in [employer].   10/13/2015  [name] returns today for follow-up. She is without any complaints. She is overall doing well and continues to be active. She denies any recurrent atrial fibrillation. She is maintaining sinus rhythm at 63 with a QTC of 431 ms.  She is aware to avoid certain medications with her flecainide which is currently 75 mg twice a day. Cholesterol is been well controlled and is due for repeat check.  10/14/2016  [name] returns today for follow-up. She recently broke the great toe on her right foot. She is currently in a walking boot. Hopefully that will come off next week. She denies any chest pain or worsening shortness of breath. She says her PVCs are well controlled on flecainide. We reviewed her most recent lipid profile which demonstrates total cholesterol of 144, HDL-C 30, triglycerides 188, LDL-C of 86. LDL-P of 1194, non-HDL-C of 114 and ApoB of 82.    10/26/2017  [name] returns today for follow-up.  She underwent repeat lipid particle testing which showed LDL P of 1151, which is stable compared to her values last year.  She reports recently an increase in some PVCs, particularly exertionally and some recent shortness of breath when she was moving chairs at a funeral.  Blood pressure is well controlled.  She underwent a routine treadmill stress test given her flecainide use.  This did show some borderline abnormalities including 2 mm of J-point depression with upsloping depression at peak exercise and some mild horizontal ST segment depression less than 1 mm.  She was able to exercise for 9 minutes and 10 metabolic equivalents.  She did report shortness of breath and fatigue during the study.  These are new symptoms for her.  Is not clear whether this could be angina on the study is borderline abnormal.  01/12/2018  [name] returns for follow-up.  Recently she underwent exercise treadmill stress testing for flecainide and was found to have some borderline abnormalities including some J-point depression and horizontal ST segment depression.  She did report shortness of breath and fatigue during the study.  Subsequently she has had 3 separate episodes of acute onset chest discomfort which lasted only for a few minutes  then followed by PVCs.  All of these were associated with minimal exertion, such as pushing her granddaughter around in the stroller.  She was sent for a coronary CT angiogram.  12/13/2017 and demonstrated mild nonobstructive predominantly calcified plaque in the distal left main and ostial/proximal LAD between 25 and 50%.  Aggressive risk factor modification was recommended.  There is no obstructive coronary disease.  Coronary calcium score was 161.  Continues to have concerns about chest pain, although I try to reassure based on these findings.  I suspect she may be having symptomatic PVCs.  This was well controlled previously on flecainide however is more prevalent.  In addition today we discussed her lipid profile.  Her LDL-P remains elevated at 1151.  Her goal LDL-P is less than 700 and given her coronary artery disease, I think she is a good candidate to add PCSK9 inhibitor therapy.  Is currently on maximally tolerated therapy with simvastatin 20 mg, fenofibrate 145 and ezetimibe 10 mg daily.  PMHx:  Past Medical History:  Diagnosis Date  . Breast CA ([place])    right - radiation & surgery  . Diabetes ([facility])    Type II, diet control  .  Diverticulosis   . Family history of heart disease   . Gastroparesis    mild  . GERD (gastroesophageal reflux disease)   . History of nuclear stress test 12/2011   bruce protocol; no evidence of ischemia, EF 79%, normal pattern of perfusion  . Hyperchloremia   . Hyperlipidemia   . Hypertension   . MVP (mitral valve prolapse)    last echo 07/1997 - normal LV size/function, mild MVP, mild MR  . PVC (premature ventricular contraction)   . Vitamin D deficiency     Past Surgical History:  Procedure Laterality Date  . ABDOMINAL HYSTERECTOMY  03/2003  . APPENDECTOMY  01/2002  . CARDIAC CATHETERIZATION  10/13/2009   no significant fixed obstructive CAD, normral ramus intermediate/circumflex/RCA; normal LV function (Dr. Corky Downs)  . MASTECTOMY Right 2006   right  lumpectomy  . TONSILLECTOMY  1969    FAMHx:  Family History  Problem Relation Age of Onset  . Diabetes Maternal Grandmother   . Heart failure Maternal Grandmother   . Emphysema Maternal Grandfather   . Breast cancer Paternal Grandmother   . Heart disease Paternal Grandfather   . Alzheimer's disease Mother   . Heart failure Father   . Hypertension Father        also murmur, hyperlipidemia, MI, CABG age 96  . Diabetes Brother   . Heart disease Brother 4       MI at 42  . Hypertension Brother   . Hyperlipidemia Sister   . Breast cancer Maternal Aunt        PVCs  . Asthma Child        x2    SOCHx:   reports that she has never smoked. She has never used smokeless tobacco. She reports that she drinks alcohol. She reports that she does not use drugs.  ALLERGIES:  Allergies  Allergen Reactions  . Amoxicillin     REACTION: rash  . Chocolate   . Crestor [Rosuvastatin]     myalgias  . Erythromycin     REACTION: Nausea    ROS: A comprehensive review of systems was negative.  HOME MEDS: Current Outpatient Medications  Medication Sig Dispense Refill  . aspirin EC 81 MG tablet Take 81 mg by mouth daily.      Abel Presto CRANBERRY GUMMIES PO Take 2 tablets by mouth daily.    . cholecalciferol (VITAMIN D) 1000 units tablet Take 1,000 Units by mouth daily.    Marland Kitchen esomeprazole (NEXIUM) 40 MG capsule Take 40 mg by mouth daily.     . Evolocumab (REPATHA SURECLICK) 710 MG/ML SOAJ Inject 1 Dose into the skin every 14 (fourteen) days.    Marland Kitchen ezetimibe (ZETIA) 10 MG tablet Take 1 tablet (10 mg total) by mouth daily. 90 tablet 2  . fenofibrate (TRICOR) 145 MG tablet TAKE 1 TABLET ONCE DAILY. 30 tablet 10  . fexofenadine (ALLEGRA) 180 MG tablet Take 180 mg by mouth every evening.    . fish oil-omega-3 fatty acids 1000 MG capsule Take 1 g by mouth 2 (two) times daily.      . flecainide (TAMBOCOR) 100 MG tablet Take 1 tablet (100 mg total) by mouth 2 (two) times daily. 180 tablet 3  . fluticasone  (FLONASE) 50 MCG/ACT nasal spray Place 1 spray into both nostrils daily.    . Glucosamine-Chondroitin (COSAMIN DS PO) Take 1 tablet by mouth daily.    . hydrochlorothiazide (HYDRODIURIL) 25 MG tablet Take 1 tablet (25 mg total) by mouth daily. 90 tablet  2  . hyoscyamine (LEVSIN SL) 0.125 MG SL tablet PLACE 1-2 TABLETS UNDER TONGUE EVERY FOUR HOURS AS NEEDED FOR ABDOMINAL PAIN. 30 tablet 11  . Multiple Vitamins-Minerals (MULTIPLE VITAMINS/WOMENS PO) Take 2 tablets by mouth daily.     Marland Kitchen NITROFURANTOIN PO Take by mouth as needed.    Marland Kitchen PARoxetine (PAXIL-CR) 12.5 MG 24 hr tablet Take 12.5 mg by mouth every morning.      . simvastatin (ZOCOR) 20 MG tablet Take 1 tablet (20 mg total) by mouth daily at 6 PM. KEEP OV. 90 tablet 2  . nitroGLYCERIN (NITROSTAT) 0.4 MG SL tablet Place 1 tablet (0.4 mg total) under the tongue every 5 (five) minutes as needed for chest pain. MAX 3 doses 25 tablet 3   No current facility-administered medications for this visit.     LABS/IMAGING: No results found for this or any previous visit (from the past 48 hour(s)). No results found.  VITALS: BP 132/70   Pulse (!) 58   Ht 5\' 2"  (1.575 m)   Wt 168 lb (76.2 kg)   BMI 30.73 kg/m   EXAM: General appearance: alert and no distress Neck: no carotid bruit, no JVD and thyroid not enlarged, symmetric, no tenderness/mass/nodules Lungs: clear to auscultation bilaterally Heart: regular rate and rhythm, S1, S2 normal, no murmur, click, rub or gallop Abdomen: soft, non-tender; bowel sounds normal; no masses,  no organomegaly and obese Extremities: extremities normal, atraumatic, no cyanosis or edema Pulses: 2+ and symmetric Skin: Skin color, texture, turgor normal. No rashes or lesions Neurologic: Grossly normal Psych: Pleasant  EKG:  N/A  ASSESSMENT: 1. Progressive dyspnea, chest pressure on exertion and fatigue 2. Borderline abnormal exercise treadmill stress test 3. PVCs-markedly improved on  flecainde. 4. Hyperlipidemia 5. GERD  PLAN: 1.   Mrs. Lanagan is recently had progressive dyspnea, chest pressure and fatigue.  Exercise treadmill stress testing was mildly abnormal and then she underwent coronary CT angiography which showed a mild nonobstructive plaque of the distal left main/proximal LAD.  Aggressive medical therapy is recommended.  I do not believe her symptoms are anginal in nature.  Perhaps they are related to more frequent PVCs.  I am recommending we increase her flecainide 200 mg twice daily.  She will need a repeat EKG in about a week.  In addition we will start paperwork for PCSK9 inhibitors as she is not a goal LDL.  Her LDL particle number remains greater than the thousand.  We will repeat a lipid profile.  Follow-up with me in a couple of months.  I provided her nitroglycerin to use as needed if she should have chest pressure that does not improve after a few minutes and rest.  Pixie Casino, MD, [facility], [place] Director of the [facility]  [facility] Diplomate of the American Board of Clinical Lipidology Attending Cardiologist  Direct Dial: [phone]  Fax: [phone]  Website:  [email or web address] [email or web address] 01/12/2018, 9:48 AM

## 2018-01-13 LAB — LIPID PANEL
Chol/HDL Ratio: 3.7 ratio (ref 0.0–4.4)
Cholesterol, Total: 125 mg/dL (ref 100–199)
HDL: 34 mg/dL — ABNORMAL LOW (ref >39)
LDL Calculated: 59 mg/dL (ref 0–99)
Triglycerides: 161 mg/dL — ABNORMAL HIGH (ref 0–149)
VLDL Cholesterol Cal: 32 mg/dL (ref 5–40)

## 2018-01-19 ENCOUNTER — Ambulatory Visit (INDEPENDENT_AMBULATORY_CARE_PROVIDER_SITE_OTHER): Payer: PRIVATE HEALTH INSURANCE | Admitting: *Deleted

## 2018-01-19 VITALS — HR 69 | Ht 62.0 in | Wt 168.0 lb

## 2018-01-19 DIAGNOSIS — Z79899 Other long term (current) drug therapy: Secondary | ICD-10-CM | POA: Diagnosis not present

## 2018-01-19 DIAGNOSIS — I493 Ventricular premature depolarization: Secondary | ICD-10-CM | POA: Diagnosis not present

## 2018-01-19 NOTE — Progress Notes (Signed)
Pt came in for EKG per Dr Debara Pickett for Flecainide increase .Adonis Housekeeper

## 2018-03-16 ENCOUNTER — Ambulatory Visit: Payer: PRIVATE HEALTH INSURANCE | Admitting: Internal Medicine

## 2018-08-23 ENCOUNTER — Other Ambulatory Visit: Payer: Self-pay | Admitting: Internal Medicine

## 2018-09-01 ENCOUNTER — Other Ambulatory Visit: Payer: Self-pay

## 2018-09-01 ENCOUNTER — Encounter (HOSPITAL_COMMUNITY): Payer: Self-pay | Admitting: Emergency Medicine

## 2018-09-01 ENCOUNTER — Emergency Department (HOSPITAL_COMMUNITY)
Admission: EM | Admit: 2018-09-01 | Discharge: 2018-09-01 | Disposition: A | Discharge status: Home / Self Care | Payer: 59 | Attending: Emergency Medicine | Admitting: Emergency Medicine

## 2018-09-01 ENCOUNTER — Emergency Department (HOSPITAL_COMMUNITY): Payer: 59

## 2018-09-01 DIAGNOSIS — K529 Noninfective gastroenteritis and colitis, unspecified: Secondary | ICD-10-CM

## 2018-09-01 DIAGNOSIS — E119 Type 2 diabetes mellitus without complications: Secondary | ICD-10-CM | POA: Diagnosis not present

## 2018-09-01 DIAGNOSIS — Z79899 Other long term (current) drug therapy: Secondary | ICD-10-CM | POA: Diagnosis not present

## 2018-09-01 DIAGNOSIS — Z7982 Long term (current) use of aspirin: Secondary | ICD-10-CM | POA: Insufficient documentation

## 2018-09-01 DIAGNOSIS — E876 Hypokalemia: Secondary | ICD-10-CM | POA: Diagnosis not present

## 2018-09-01 DIAGNOSIS — I10 Essential (primary) hypertension: Secondary | ICD-10-CM | POA: Insufficient documentation

## 2018-09-01 DIAGNOSIS — R509 Fever, unspecified: Secondary | ICD-10-CM | POA: Diagnosis present

## 2018-09-01 LAB — URINALYSIS, ROUTINE W REFLEX MICROSCOPIC
Bilirubin Urine: NEGATIVE
Glucose, UA: >=500 mg/dL — AB
Ketones, ur: NEGATIVE mg/dL
Nitrite: NEGATIVE
Protein, ur: NEGATIVE mg/dL
Specific Gravity, Urine: 1.024 (ref 1.005–1.030)
pH: 5 (ref 5.0–8.0)

## 2018-09-01 LAB — COMPREHENSIVE METABOLIC PANEL
ALT: 31 U/L (ref 0–44)
AST: 32 U/L (ref 15–41)
Albumin: 4 g/dL (ref 3.5–5.0)
Alkaline Phosphatase: 72 U/L (ref 38–126)
Anion gap: 12 (ref 5–15)
BUN: 15 mg/dL (ref 8–23)
CO2: 21 mmol/L — ABNORMAL LOW (ref 22–32)
Calcium: 9.8 mg/dL (ref 8.9–10.3)
Chloride: 100 mmol/L (ref 98–111)
Creatinine, Ser: 1.15 mg/dL — ABNORMAL HIGH (ref 0.44–1.00)
GFR calc Af Amer: 58 mL/min — ABNORMAL LOW (ref >60)
GFR calc non Af Amer: 50 mL/min — ABNORMAL LOW (ref >60)
Glucose, Bld: 281 mg/dL — ABNORMAL HIGH (ref 70–99)
Potassium: 3 mmol/L — ABNORMAL LOW (ref 3.5–5.1)
Sodium: 133 mmol/L — ABNORMAL LOW (ref 135–145)
Total Bilirubin: 0.4 mg/dL (ref 0.3–1.2)
Total Protein: 6.9 g/dL (ref 6.5–8.1)

## 2018-09-01 LAB — LACTIC ACID, PLASMA
Lactic Acid, Venous: 1.9 mmol/L (ref 0.5–1.9)
Lactic Acid, Venous: 1.1 mmol/L (ref 0.5–1.9)

## 2018-09-01 LAB — I-STAT TROPONIN, ED: Troponin i, poc: 0 ng/mL (ref 0.00–0.08)

## 2018-09-01 LAB — CBC WITH DIFFERENTIAL/PLATELET
Abs Immature Granulocytes: 0.05 10*3/uL (ref 0.00–0.07)
Basophils Absolute: 0 10*3/uL (ref 0.0–0.1)
Basophils Relative: 0 %
Eosinophils Absolute: 0.1 10*3/uL (ref 0.0–0.5)
Eosinophils Relative: 0 %
HCT: 39.8 % (ref 36.0–46.0)
Hemoglobin: 14 g/dL (ref 12.0–15.0)
Immature Granulocytes: 0 %
Lymphocytes Relative: 8 %
Lymphs Abs: 1 10*3/uL (ref 0.7–4.0)
MCH: 32.3 pg (ref 26.0–34.0)
MCHC: 35.2 g/dL (ref 30.0–36.0)
MCV: 91.9 fL (ref 80.0–100.0)
Monocytes Absolute: 0.6 10*3/uL (ref 0.1–1.0)
Monocytes Relative: 5 %
Neutro Abs: 10.2 10*3/uL — ABNORMAL HIGH (ref 1.7–7.7)
Neutrophils Relative %: 87 %
Platelets: 298 10*3/uL (ref 150–400)
RBC: 4.33 MIL/uL (ref 3.87–5.11)
RDW: 12.3 % (ref 11.5–15.5)
WBC: 12 10*3/uL — ABNORMAL HIGH (ref 4.0–10.5)
nRBC: 0 % (ref 0.0–0.2)

## 2018-09-01 LAB — INFLUENZA PANEL BY PCR (TYPE A & B)
Influenza A By PCR: NEGATIVE
Influenza B By PCR: NEGATIVE

## 2018-09-01 LAB — LIPASE, BLOOD: Lipase: 25 U/L (ref 11–51)

## 2018-09-01 MED ORDER — HYDROMORPHONE HCL 1 MG/ML IJ SOLN
1.0000 mg | Freq: Once | INTRAMUSCULAR | Status: AC
  Administered 2018-09-01: 1 mg via INTRAVENOUS
  Filled 2018-09-01: qty 1

## 2018-09-01 MED ORDER — FENTANYL CITRATE (PF) 100 MCG/2ML IJ SOLN
25.0000 ug | Freq: Once | INTRAMUSCULAR | Status: AC
  Administered 2018-09-01: 25 ug via INTRAVENOUS
  Filled 2018-09-01: qty 2

## 2018-09-01 MED ORDER — VANCOMYCIN HCL 10 G IV SOLR
1500.0000 mg | Freq: Once | INTRAVENOUS | Status: AC
  Administered 2018-09-01: 1500 mg via INTRAVENOUS
  Filled 2018-09-01: qty 1500

## 2018-09-01 MED ORDER — POTASSIUM CHLORIDE CRYS ER 20 MEQ PO TBCR
40.0000 meq | EXTENDED_RELEASE_TABLET | Freq: Once | ORAL | Status: AC
  Administered 2018-09-01: 40 meq via ORAL
  Filled 2018-09-01: qty 2

## 2018-09-01 MED ORDER — ONDANSETRON 4 MG PO TBDP: 4.0000 mg | ORAL_TABLET | Freq: Three times a day (TID) | ORAL | 0 refills | Status: DC | PRN

## 2018-09-01 MED ORDER — OXYCODONE-ACETAMINOPHEN 5-325 MG PO TABS: 1.0000 | ORAL_TABLET | Freq: Four times a day (QID) | ORAL | 0 refills | Status: DC | PRN

## 2018-09-01 MED ORDER — SODIUM CHLORIDE 0.9 % IV SOLN: 2.0000 g | INTRAVENOUS | Status: DC

## 2018-09-01 MED ORDER — PROMETHAZINE HCL 25 MG/ML IJ SOLN
12.5000 mg | Freq: Once | INTRAMUSCULAR | Status: AC
  Administered 2018-09-01: 12.5 mg via INTRAVENOUS
  Filled 2018-09-01: qty 1

## 2018-09-01 MED ORDER — VANCOMYCIN HCL IN DEXTROSE 1-5 GM/200ML-% IV SOLN: 1000.0000 mg | INTRAVENOUS | Status: DC

## 2018-09-01 MED ORDER — ONDANSETRON HCL 4 MG/2ML IJ SOLN
4.0000 mg | Freq: Once | INTRAMUSCULAR | Status: AC
  Administered 2018-09-01: 4 mg via INTRAVENOUS
  Filled 2018-09-01: qty 2

## 2018-09-01 MED ORDER — SODIUM CHLORIDE 0.9 % IV BOLUS
1000.0000 mL | Freq: Once | INTRAVENOUS | Status: AC
  Administered 2018-09-01: 1000 mL via INTRAVENOUS

## 2018-09-01 MED ORDER — CIPROFLOXACIN HCL 500 MG PO TABS: 500.0000 mg | ORAL_TABLET | Freq: Two times a day (BID) | ORAL | 0 refills | Status: DC

## 2018-09-01 MED ORDER — ACETAMINOPHEN 325 MG PO TABS
325.0000 mg | ORAL_TABLET | Freq: Once | ORAL | Status: AC
  Administered 2018-09-01: 325 mg via ORAL
  Filled 2018-09-01: qty 1

## 2018-09-01 MED ORDER — METRONIDAZOLE IN NACL 5-0.79 MG/ML-% IV SOLN
500.0000 mg | Freq: Once | INTRAVENOUS | Status: AC
  Administered 2018-09-01: 500 mg via INTRAVENOUS
  Filled 2018-09-01: qty 100

## 2018-09-01 MED ORDER — VANCOMYCIN HCL IN DEXTROSE 1-5 GM/200ML-% IV SOLN: 1000.0000 mg | Freq: Once | INTRAVENOUS | Status: DC

## 2018-09-01 MED ORDER — POTASSIUM CHLORIDE CRYS ER 20 MEQ PO TBCR: 20.0000 meq | EXTENDED_RELEASE_TABLET | Freq: Every day | ORAL | 0 refills | Status: DC

## 2018-09-01 MED ORDER — CEFEPIME HCL 2 G IJ SOLR
2.0000 g | Freq: Once | INTRAMUSCULAR | Status: AC
  Administered 2018-09-01: 2 g via INTRAVENOUS
  Filled 2018-09-01: qty 2

## 2018-09-01 MED ORDER — METRONIDAZOLE 500 MG PO TABS: 500.0000 mg | ORAL_TABLET | Freq: Three times a day (TID) | ORAL | 0 refills | Status: DC

## 2018-09-01 MED ORDER — IOHEXOL 300 MG/ML  SOLN
100.0000 mL | Freq: Once | INTRAMUSCULAR | Status: AC | PRN
  Administered 2018-09-01: 100 mL via INTRAVENOUS

## 2018-09-01 NOTE — ED Provider Notes (Signed)
[facility] EMERGENCY DEPARTMENT Provider Note   CSN: [id number] Arrival date & time: 09/01/18  1701    History   Chief Complaint Chief Complaint  Patient presents with  . Fever  . Chills    HPI [name] is a 63 y.o. female with a hx of breast cancer in remission, T2DM w/ gastroparesis, GERD, HTN, hyperlipidemia, diverticulosis, IBS, and atrial fibrillation who presents to the ER with chief complaint of fever x 3 days. Patient notes she woke up at 00:30 03/18 with subjective fever & chills, ultimately became nausea with a few episodes of emesis same day. She has not vomited subsequent days. She has however had continued nausea, chills, fevers w/ temp max of 101 & has developed abdominal discomfort (R sided, constant, cramping- worse with eating, alleviated w/ diarrhea), diarrhea (non bloody/non mucousy), dysuria, urgency, and some chest tightness. Taking tylenol with some improvement- last dose was 650 mg 1 hour PTA. Denies congestion, sore throat, significant cough, dyspnea, hematemesis, melena, hematochezia, recent abx or travel, or covid 19 exposures.      HPI  Past Medical History:  Diagnosis Date  . Breast CA ([place])    right - radiation & surgery  . Diabetes ([facility])    Type II, diet control  . Diverticulosis   . Family history of heart disease   . Gastroparesis    mild  . GERD (gastroesophageal reflux disease)   . History of nuclear stress test 12/2011   bruce protocol; no evidence of ischemia, EF 79%, normal pattern of perfusion  . Hyperchloremia   . Hyperlipidemia   . Hypertension   . MVP (mitral valve prolapse)    last echo 07/1997 - normal LV size/function, mild MVP, mild MR  . PVC (premature ventricular contraction)   . Vitamin D deficiency     Patient Active Problem List   Diagnosis Date Noted  . Chest pressure 01/12/2018  . Coronary artery disease involving native coronary artery of native heart with angina pectoris ([place]) 01/12/2018  .  Abnormal ECG during exercise stress test 10/26/2017  . Shortness of breath 10/26/2017  . Encounter for monitoring flecainide therapy 09/23/2014  . Atrial fibrillation, unspecified 09/23/2014  . Sinusitis 03/18/2014  . Fatigue 03/25/2013  . PVC's (premature ventricular contractions) 03/19/2013  . HYPERTHYROIDISM 11/07/2009  . DM2 (diabetes mellitus, type 2) ([place]) 11/07/2009  . ANXIETY 11/07/2009  . GERD 11/07/2009  . EARLY SATIETY 11/07/2009  . WEIGHT LOSS-ABNORMAL 11/07/2009  . HEARTBURN 11/07/2009  . ABDOMINAL PAIN-EPIGASTRIC 11/07/2009  . PERSONAL HX BREAST CANCER 11/07/2009  . ADENOCARCINOMA, BREAST 08/04/2007  . Hyperlipidemia 08/04/2007  . IRRITABLE BOWEL SYNDROME 08/04/2007  . OVARIAN CYST 08/04/2007  . ABDOMINAL BLOATING 08/04/2007  . GASTRIC POLYP 03/30/2007  . GASTROPARESIS 03/28/2007  . DIVERTICULOSIS, COLON 06/26/2004    Past Surgical History:  Procedure Laterality Date  . ABDOMINAL HYSTERECTOMY  03/2003  . APPENDECTOMY  01/2002  . CARDIAC CATHETERIZATION  10/13/2009   no significant fixed obstructive CAD, normral ramus intermediate/circumflex/RCA; normal LV function (Dr. Corky Downs)  . MASTECTOMY Right 2006   right lumpectomy  . TONSILLECTOMY  1969     OB History   No obstetric history on file.      Home Medications    Prior to Admission medications   Medication Sig Start Date End Date Taking? Authorizing Provider  aspirin EC 81 MG tablet Take 81 mg by mouth daily.      [provider]  AZO CRANBERRY GUMMIES PO Take 2 tablets  by mouth daily.    [provider]  cholecalciferol (VITAMIN D) 1000 units tablet Take 1,000 Units by mouth daily.    [provider]  esomeprazole (NEXIUM) 40 MG capsule Take 40 mg by mouth daily.     [provider]  Evolocumab (REPATHA SURECLICK) 606 MG/ML SOAJ Inject 1 Dose into the skin every 14 (fourteen) days.    [provider]  ezetimibe (ZETIA) 10 MG tablet TAKE 1 TABLET ONCE DAILY.  08/23/18   Hilty, Nadean Corwin, MD  fenofibrate (TRICOR) 145 MG tablet TAKE 1 TABLET ONCE DAILY. 11/21/17   Hilty, Nadean Corwin, MD  fexofenadine (ALLEGRA) 180 MG tablet Take 180 mg by mouth every evening.    [provider]  fish oil-omega-3 fatty acids 1000 MG capsule Take 1 g by mouth 2 (two) times daily.      [provider]  flecainide (TAMBOCOR) 100 MG tablet Take 1 tablet (100 mg total) by mouth 2 (two) times daily. 01/12/18   Hilty, Nadean Corwin, MD  fluticasone (FLONASE) 50 MCG/ACT nasal spray Place 1 spray into both nostrils daily.    [provider]  Glucosamine-Chondroitin (COSAMIN DS PO) Take 1 tablet by mouth daily.    [provider]  hydrochlorothiazide (HYDRODIURIL) 25 MG tablet Take 1 tablet (25 mg total) by mouth daily. 11/09/17   Hilty, Nadean Corwin, MD  hyoscyamine (LEVSIN SL) 0.125 MG SL tablet PLACE 1-2 TABLETS UNDER TONGUE EVERY FOUR HOURS AS NEEDED FOR ABDOMINAL PAIN. 10/14/17   Irene Shipper, MD  Multiple Vitamins-Minerals (MULTIPLE VITAMINS/WOMENS PO) Take 2 tablets by mouth daily.     [provider]  NITROFURANTOIN PO Take by mouth as needed.    [provider]  nitroGLYCERIN (NITROSTAT) 0.4 MG SL tablet Place 1 tablet (0.4 mg total) under the tongue every 5 (five) minutes as needed for chest pain. MAX 3 doses 01/12/18 04/12/18  Pixie Casino, MD  PARoxetine (PAXIL-CR) 12.5 MG 24 hr tablet Take 12.5 mg by mouth every morning.      [provider]  simvastatin (ZOCOR) 20 MG tablet Take 1 tablet (20 mg total) by mouth daily at 6 PM. KEEP OV. 11/09/17   Hilty, Nadean Corwin, MD    Family History Family History  Problem Relation Age of Onset  . Diabetes Maternal Grandmother   . Heart failure Maternal Grandmother   . Emphysema Maternal Grandfather   . Breast cancer Paternal Grandmother   . Heart disease Paternal Grandfather   . Alzheimer's disease Mother   . Heart failure Father   . Hypertension Father        also murmur,  hyperlipidemia, MI, CABG age 46  . Diabetes Brother   . Heart disease Brother 41       MI at 73  . Hypertension Brother   . Hyperlipidemia Sister   . Breast cancer Maternal Aunt        PVCs  . Asthma Child        x2    Social History Social History   Tobacco Use  . Smoking status: Never Smoker  . Smokeless tobacco: Never Used  Substance Use Topics  . Alcohol use: Yes  . Drug use: No     Allergies   Amoxicillin; Chocolate; Crestor [rosuvastatin]; and Erythromycin   Review of Systems Review of Systems  Constitutional: Positive for chills and fever.  HENT: Negative for congestion, ear pain and sore throat.   Respiratory: Positive for chest tightness. Negative for cough and  shortness of breath.   Cardiovascular: Positive for chest pain. Negative for palpitations and leg swelling.  Gastrointestinal: Positive for abdominal pain, diarrhea, nausea and vomiting. Negative for anal bleeding, blood in stool and constipation.  Genitourinary: Positive for dysuria and urgency. Negative for vaginal bleeding and vaginal discharge.  Neurological: Negative for syncope.  All other systems reviewed and are negative.  Physical Exam Updated Vital Signs BP (!) 153/91 (BP Location: Right Arm)   Pulse 96   Temp (!) 101.4 F (38.6 C) (Oral)   Ht 5\' 2"  (1.575 m)   Wt 74.8 kg   SpO2 97%   BMI 30.18 kg/m   Physical Exam Vitals signs and nursing note reviewed.  Constitutional:      General: She is in acute distress (mild, appears uncomfortable).     Appearance: She is well-developed.  HENT:     Head: Normocephalic and atraumatic.     Right Ear: Ear canal normal. Tympanic membrane is not perforated, erythematous, retracted or bulging.     Left Ear: Ear canal normal. Tympanic membrane is not perforated, erythematous, retracted or bulging.     Ears:     Comments: No mastoid erythema/swelling/tenderness.     Nose:     Right Sinus: No maxillary sinus tenderness or frontal sinus tenderness.      Left Sinus: No maxillary sinus tenderness or frontal sinus tenderness.     Mouth/Throat:     Pharynx: Uvula midline. No oropharyngeal exudate or posterior oropharyngeal erythema.  Eyes:     General:        Right eye: No discharge.        Left eye: No discharge.     Conjunctiva/sclera: Conjunctivae normal.     Pupils: Pupils are equal, round, and reactive to light.  Neck:     Musculoskeletal: Normal range of motion and neck supple. No edema or neck rigidity.  Cardiovascular:     Rate and Rhythm: Normal rate and regular rhythm.     Heart sounds: No murmur.  Pulmonary:     Effort: Pulmonary effort is normal. No respiratory distress.     Breath sounds: Normal breath sounds. No wheezing, rhonchi or rales.  Abdominal:     General: There is no distension.     Palpations: Abdomen is soft.     Tenderness: There is abdominal tenderness (generalized, most prominent in RLQ). There is guarding (mild). There is no right CVA tenderness, left CVA tenderness or rebound.  Lymphadenopathy:     Cervical: No cervical adenopathy.  Skin:    General: Skin is warm and dry.     Findings: No rash.  Neurological:     General: No focal deficit present.     Mental Status: She is alert.  Psychiatric:        Behavior: Behavior normal.    ED Treatments / Results  Labs (all labs ordered are listed, but only abnormal results are displayed) Labs Reviewed  CBC WITH DIFFERENTIAL/PLATELET - Abnormal; Notable for the following components:      Result Value   WBC 12.0 (*)    Neutro Abs 10.2 (*)    All other components within normal limits  COMPREHENSIVE METABOLIC PANEL - Abnormal; Notable for the following components:   Sodium 133 (*)    Potassium 3.0 (*)    CO2 21 (*)    Glucose, Bld 281 (*)    Creatinine, Ser 1.15 (*)    GFR calc non Af Amer 50 (*)    GFR calc Af  Amer 58 (*)    All other components within normal limits  URINALYSIS, ROUTINE W REFLEX MICROSCOPIC - Abnormal; Notable for the following  components:   Glucose, UA >=500 (*)    Hgb urine dipstick SMALL (*)    Leukocytes,Ua SMALL (*)    Bacteria, UA RARE (*)    All other components within normal limits  CULTURE, BLOOD (ROUTINE X 2)  CULTURE, BLOOD (ROUTINE X 2)  URINE CULTURE  LIPASE, BLOOD  LACTIC ACID, PLASMA  LACTIC ACID, PLASMA  INFLUENZA PANEL BY PCR (TYPE A & B)  I-STAT TROPONIN, ED    EKG EKG Interpretation  Date/Time:  Friday September 01 2018 17:35:34 EDT Ventricular Rate:  88 PR Interval:    QRS Duration: 94 QT Interval:  393 QTC Calculation: 476 R Axis:   26 Text Interpretation:  Sinus rhythm Nonspecific T abnormalities, anterior leads Confirmed by Pattricia Boss [phone]) on 09/01/2018 7:10:57 PM   Radiology Dg Chest 1 View  Result Date: 09/01/2018 CLINICAL DATA:  Chest tightness EXAM: CHEST  1 VIEW COMPARISON:  07/27/2015 FINDINGS: The heart size and mediastinal contours are within normal limits. Both lungs are clear. Probable resection of the distal right clavicle. IMPRESSION: No active disease. Electronically Signed   By: Donavan Foil M.D.   On: 09/01/2018 17:37   Ct Abdomen Pelvis W Contrast  Result Date: 09/01/2018 CLINICAL DATA:  Fever, chills, abdominal discomfort after eating, diarrhea, and back pain since Wednesday, ureteral pain for couple weeks, history diabetes mellitus, hypertension, GERD, hyperlipidemia, PVCs, breast cancer post surgery and radiation therapy, irritable bowel syndrome EXAM: CT ABDOMEN AND PELVIS WITH CONTRAST TECHNIQUE: Multidetector CT imaging of the abdomen and pelvis was performed using the standard protocol following bolus administration of intravenous contrast. Sagittal and coronal MPR images reconstructed from axial data set. CONTRAST:  174mL OMNIPAQUE IOHEXOL 300 MG/ML SOLN IV. No oral contrast. COMPARISON:  04/06/2016 FINDINGS: Lower chest: Minimal dependent bibasilar atelectasis Hepatobiliary: Diffuse fatty infiltration of liver. Gallbladder and liver otherwise normal  appearance. Pancreas: Normal appearance Spleen: Normal appearance Adrenals/Urinary Tract: Adrenal glands, kidneys, ureters, and bladder normal appearance Stomach/Bowel: Appendix surgically absent. Stomach and small bowel loops unremarkable. Minimal distal colonic diverticulosis without evidence of diverticulitis. Subtle stranding identified lateral to the ascending colon, though colon does not appear definitely thickened nor show evidence of ascending colonic diverticula. This could represent sequela/fibrosis at site of prior inflammation or subtle acute nonspecific inflammatory process. Remainder of colon unremarkable. Vascular/Lymphatic: Atherosclerotic calcifications aorta without aneurysm. Scattered pelvic phleboliths. No adenopathy. Reproductive: Uterus surgically absent with nonvisualization of ovaries. Other: No free air or free fluid.  No hernia. Musculoskeletal: Bones demineralized. Degenerative disc disease changes lumbar spine with mild retrolisthesis at L3-L4. IMPRESSION: Minimal infiltrative change at the RIGHT pericolic gutter lateral to the ascending colon though no definite colonic wall thickening or ascending colonic diverticula are noted. This could represent fibrosis/sequela of prior inflammation or a subtle nonspecific acute inflammatory process. Fatty infiltration of liver. Minimal distal colonic diverticulosis without evidence of diverticulitis. Electronically Signed   By: Lavonia Dana M.D.   On: 09/01/2018 21:26    Procedures Procedures (including critical care time)  Medications Ordered in ED Medications  ceFEPIme (MAXIPIME) 2 g in sodium chloride 0.9 % 100 mL IVPB (has no administration in time range)  vancomycin (VANCOCIN) IVPB 1000 mg/200 mL premix (has no administration in time range)  HYDROmorphone (DILAUDID) injection 1 mg (1 mg Intravenous Given 09/01/18 1747)  ondansetron (ZOFRAN) injection 4 mg (4 mg Intravenous Given 09/01/18 1746)  sodium chloride 0.9 % bolus 1,000 mL (0  mLs Intravenous Stopped 09/01/18 1921)  acetaminophen (TYLENOL) tablet 325 mg (325 mg Oral Given 09/01/18 1746)  sodium chloride 0.9 % bolus 1,000 mL (0 mLs Intravenous Stopped 09/01/18 2100)  ceFEPIme (MAXIPIME) 2 g in sodium chloride 0.9 % 100 mL IVPB (0 g Intravenous Stopped 09/01/18 2036)  metroNIDAZOLE (FLAGYL) IVPB 500 mg (0 mg Intravenous Stopped 09/01/18 2100)  vancomycin (VANCOCIN) 1,500 mg in sodium chloride 0.9 % 500 mL IVPB (0 mg Intravenous Stopped 09/01/18 2320)  fentaNYL (SUBLIMAZE) injection 25 mcg (25 mcg Intravenous Given 09/01/18 2027)  promethazine (PHENERGAN) injection 12.5 mg (12.5 mg Intravenous Given 09/01/18 2043)  iohexol (OMNIPAQUE) 300 MG/ML solution 100 mL (100 mLs Intravenous Contrast Given 09/01/18 2054)  potassium chloride SA (K-DUR,KLOR-CON) CR tablet 40 mEq (40 mEq Oral Given 09/01/18 2220)     Initial Impression / Assessment and Plan / ED Course  I have reviewed the triage vital signs and the nursing notes.  Pertinent labs & imaging results that were available during my care of the patient were reviewed by me and considered in my medical decision making (see chart for details).    Patient presents with multiple complaints including fever, chills, abdominal pain, and some chest tightness. Patient nontoxic appearing, noted to be febrile upon arrival, BP mildly elevated, not tachycardic. Exam w/ clear lungs, generalized abdominal tenderness most prominent to RLQ with mild guarding. Proceed w/ labs, EKG, CXR, & CT abdomen/pelvis. Pain control, fluids.   Work-up reviewed:  CBC: Mild leukocytosis at 12.0 w/ left shift. No anemia.  CMP: mild electrolyte disturbances including hyponatremia @ 133, hypokalemia @ 3.0. Hyperglycemia at 281- hx of DM, mild acidosis, anion gap normal, no ketonuria- not in DKA currently. Creatinine mildly elevated but similar to prior ranges, receiving fludis.  Lipase: WNL UA: Leuks, rare bacteria, ketonuria, small hgb- not overly convincing for  UTI, but patient is symptomatic so remains of consideration, will culture.  Trop: negative EKG: No STEMI, hx does not seem consistent with ACS.  CXR: Negative for infiltrate, edema, effusion, pneumothorax.  Lactic acid: WNL    19:20: Fever w/ leukocytosis, considering broad spectrum abx, she does not appear toxic, suspicion for sepsis is low, flu & CT still pending as well as blood cultures. Lactic acid WNL. Pressures somewhat lower however following dialudid & remain with MAP > 65. Discussed w/ Dr. Jeanell Sparrow- will start broad spectrum abx, patient has PCN allergy, has tolerated cephalosporins previously without difficulty, abx order set utilized, hold off on code sepsis currently, continue to monitor.    CT scan w/ minimal infiltrative change at the RIGHT pericolic gutter lateral to the ascending colon though no definite colonic wall thickening or ascending colonic diverticula are noted. This could represent fibrosis/sequela of prior inflammation or a subtle nonspecific acute inflammatory process. Fatty infiltration of liver. Minimal distal colonic diverticulosis without evidence of diverticulitis.   Discussed findings & plan of care with supervising physician Dr. Jeanell Sparrow - recommends PO challenge if able to tolerate discharge home w/ tx for colitis w/ cipro/flagyl- I am in agreement. Patient tolerating PO, she feels much better, and would like to be discharged. DC home with symptomatic management, abx, and potassium supplementation. PCP follow up. I discussed results, treatment plan, need for follow-up, and return precautions with the patient & her husband. Provided opportunity for questions, patient & her husband confirmed understanding and are in agreement with plan.   Vitals:   09/01/18 2130 09/01/18 2328  BP: 124/68 110/68  Pulse: 72 65  Resp: 16 16  Temp:  98.5 F (36.9 C)  SpO2: 97% 98%     Final Clinical Impressions(s) / ED Diagnoses   Final diagnoses:  Colitis  Hypokalemia    ED  Discharge Orders         Ordered    ondansetron (ZOFRAN ODT) 4 MG disintegrating tablet  Every 8 hours PRN     09/01/18 2243    oxyCODONE-acetaminophen (PERCOCET/ROXICET) 5-325 MG tablet  Every 6 hours PRN     09/01/18 2243    metroNIDAZOLE (FLAGYL) 500 MG tablet  3 times daily     09/01/18 2243    ciprofloxacin (CIPRO) 500 MG tablet  Every 12 hours     09/01/18 2243    potassium chloride SA (K-DUR,KLOR-CON) 20 MEQ tablet  Daily     09/01/18 [address], PA-C 09/01/18 2336    Pattricia Boss, MD 09/02/18 1413

## 2018-09-01 NOTE — Discharge Instructions (Addendum)
You were seen in the ER today for fever with abdominal pain and chest discomfort. Your potassium was somewhat low at 3.1- we gave you potassium in the ER and are sending you home with a supplement as well as diet recommendations.  Your white blood cell count was somewhat elevated which is consistent with infection.  Your urine showed some white blood cells in it, we will culture this to determine if you have a UTI.  Your kidney function and blood sugar were both a bit elevated, have this rechecked by primary care.  Your CT scan showed some findings that correlate with colitis, and infection of the colon.  We are sending you home with the following medicines:  - Cipro/flagyl: these are antibiotics to treat the infection. Please take as prescribed. Do not consume EtOH with flagyl as it can be extremely dangerous.  - Percocet-this is a narcotic/controlled substance medication that has potential addicting qualities.  We recommend that you take 1-2 tablets every 6 hours as needed for severe pain.  Do not drive or operate heavy machinery when taking this medicine as it can be sedating. Do not drink alcohol or take other sedating medications when taking this medicine for safety reasons.  Keep this out of reach of small children.  Please be aware this medicine has Tylenol in it (325 mg/tab) do not exceed the maximum dose of Tylenol in a day per over the counter recommendations should you decide to supplement with Tylenol over the counter.  - Zofran- this is an anti-nausea medication to be taken every 8 hours as needed for nausea/vomiting.  -Potassium/caterer-this is a medicine to treat low potassium.  We have prescribed you new medication(s) today. Discuss the medications prescribed today with your pharmacist as they can have adverse effects and interactions with your other medicines including over the counter and prescribed medications. Seek medical evaluation if you start to experience new or abnormal symptoms  after taking one of these medicines, seek care immediately if you start to experience difficulty breathing, feeling of your throat closing, facial swelling, or rash as these could be indications of a more serious allergic reaction  Please follow up with primary care within 3 days for re-evaluation. Return to the ER for new or worsening symptoms including but not limited to fever, worsened pain, inability to keep fluids down, inability to have a bowel movement, bloody bowel movements, or any other concerns.

## 2018-09-01 NOTE — ED Notes (Signed)
Patient transported to CT 

## 2018-09-01 NOTE — ED Notes (Signed)
Patient verbalizes understanding of discharge instructions. Opportunity for questioning and answers were provided. Armband removed by staff, pt discharged from ED.  

## 2018-09-01 NOTE — ED Triage Notes (Addendum)
Pt arrives to ED from home with complaints of fever, chills, abdominal discomfort after she eats, diarrhea, and back pain since Wednesday. Pt also complains of pain in her urethra for the past couple of weeks.

## 2018-09-01 NOTE — Progress Notes (Signed)
Pharmacy Antibiotic Note  [name] is a 63 y.o. female admitted on 09/01/2018 with sepsis.  Pharmacy has been consulted for cefepime and vancomycin dosing.  Plan: Start cefepime 2g IV Q24h Give vancomycin 1,500mg  IV x 1, then start vancomycin 1g IV Q24h Monitor clinical picture, renal function, vanc levels prn F/U C&S, abx deescalation / LOT   Height: 5\' 2"  (157.5 cm) Weight: 165 lb (74.8 kg) IBW/kg (Calculated) : 50.1  Temp (24hrs), Avg:101.4 F (38.6 C), Min:101.4 F (38.6 C), Max:101.4 F (38.6 C)  Recent Labs  Lab 09/01/18 1735 09/01/18 1755  WBC 12.0*  --   CREATININE 1.15*  --   LATICACIDVEN  --  1.9    Estimated Creatinine Clearance: 46.8 mL/min (A) (by C-G formula based on SCr of 1.15 mg/dL (H)).    Allergies  Allergen Reactions  . Amoxicillin     REACTION: rash  . Chocolate   . Crestor [Rosuvastatin]     myalgias  . Erythromycin     REACTION: Nausea  . Morphine And Related Nausea And Vomiting    Thank you for allowing pharmacy to be a part of this patient's care.  Reginia Naas 09/01/2018 7:22 PM

## 2018-09-01 NOTE — ED Notes (Signed)
Portable xray at bedside.

## 2018-09-02 LAB — URINE CULTURE: Culture: NO GROWTH

## 2018-09-04 ENCOUNTER — Telehealth: Payer: Self-pay | Admitting: Internal Medicine

## 2018-09-04 NOTE — Telephone Encounter (Signed)
Not sure what to do with this? Triage? Schedule?  Let me know and I'll do whatever I need to

## 2018-09-04 NOTE — Telephone Encounter (Signed)
Pt was in ED 3.20.20 and stated that she was diagnosed with colitis due to stomach virus. Pt had a fever of 102.4 F.  Pt was directed to schedule with GI MD.  Please advise.

## 2018-09-05 NOTE — Telephone Encounter (Signed)
Pt states last Wed she had a fever and vomiting. As the week progressed the fever continued and Friday was 102.4, she had severe abd cramping and diarrhea. Pt was told to proceed to the ER. She was diagnosed with GI bug that caused colitis. She has been given cipro and flagyl along with antiemetic prn. Saw her PCP yesterday and was told to contact Dr. Henrene Pastor to get his recommendation as she has not had colitis in the past. Please advise.

## 2018-09-05 NOTE — Telephone Encounter (Signed)
She has been to the ER and saw her doctor yesterday.  Nothing new to recommend other than supportive measures (vigorous hydration, Tylenol, and rest) and finish her antibiotics.  No need to come to this office as she is acutely infected.  If she were to deteriorate in any fashion, she should return to the hospital for reevaluation.  Thanks

## 2018-09-05 NOTE — Telephone Encounter (Signed)
Spoke with pt and she is aware.

## 2018-09-06 LAB — CULTURE, BLOOD (ROUTINE X 2)
Culture: NO GROWTH
Culture: NO GROWTH
Special Requests: ADEQUATE
Special Requests: ADEQUATE

## 2018-09-20 ENCOUNTER — Other Ambulatory Visit: Payer: Self-pay | Admitting: Internal Medicine

## 2018-09-20 NOTE — Telephone Encounter (Signed)
Simvastatin refilled. 

## 2018-10-08 ENCOUNTER — Other Ambulatory Visit: Payer: Self-pay | Admitting: Internal Medicine

## 2018-10-09 NOTE — Telephone Encounter (Signed)
HCTZ 25 mg refilled. 

## 2018-10-23 ENCOUNTER — Other Ambulatory Visit: Payer: Self-pay | Admitting: Internal Medicine

## 2018-11-08 ENCOUNTER — Telehealth: Payer: Self-pay | Admitting: Internal Medicine

## 2018-11-08 NOTE — Telephone Encounter (Signed)
Per pt for 2 years has had mild diabetes and was able to control with diet Pt was recently hospitalized in March with colitis and since sugar has been out of control PMD has started pt on Metformin 500 mg bid in hopes of increasing to 1000 mg bid.Pt wanted to let Dr Debara Pickett know and also appears pt is over due for appt . Will forward to Dr Debara Pickett .Adonis Housekeeper

## 2018-11-08 NOTE — Telephone Encounter (Signed)
Spoke with pt and feels fine made an appt for 02/13/19 at 8:00 am Pt will call back if as issues ./cy

## 2018-11-08 NOTE — Telephone Encounter (Signed)
Thanks .. good to know. I can see her in a virtual appt if she would like?  Dr. Lemmie Evens

## 2018-11-08 NOTE — Telephone Encounter (Signed)
New Message            Patient is calling today to give Dr. Geralyn Corwin an update on her medical condition, patient states that something has changed and she wants Dr. Geralyn Corwin to be aware. Pls call to advise.

## 2018-11-24 ENCOUNTER — Other Ambulatory Visit: Payer: Self-pay | Admitting: Internal Medicine

## 2018-12-15 ENCOUNTER — Other Ambulatory Visit: Payer: Self-pay | Admitting: Internal Medicine

## 2018-12-18 NOTE — Telephone Encounter (Signed)
MUST KEEP APPOINTMENT 02/13/19 WITH DR HILTY FOR FUTURE REFILLS

## 2019-01-08 ENCOUNTER — Other Ambulatory Visit: Payer: Self-pay | Admitting: Internal Medicine

## 2019-01-30 DIAGNOSIS — E785 Hyperlipidemia, unspecified: Secondary | ICD-10-CM

## 2019-02-06 LAB — NMR, LIPOPROFILE
Cholesterol, Total: 127 mg/dL (ref 100–199)
HDL Particle Number: 36.8 umol/L (ref >=30.5)
HDL-C: 34 mg/dL — ABNORMAL LOW (ref >39)
LDL Particle Number: 1202 nmol/L — ABNORMAL HIGH (ref <1000)
LDL Size: 19.7 nm — ABNORMAL LOW (ref >20.5)
LDL-C: 57 mg/dL (ref 0–99)
LP-IR Score: 77 — ABNORMAL HIGH (ref <=45)
Small LDL Particle Number: 904 nmol/L — ABNORMAL HIGH (ref <=527)
Triglycerides: 181 mg/dL — ABNORMAL HIGH (ref 0–149)

## 2019-02-12 ENCOUNTER — Other Ambulatory Visit: Payer: Self-pay | Admitting: Internal Medicine

## 2019-02-13 ENCOUNTER — Other Ambulatory Visit: Payer: Self-pay

## 2019-02-13 ENCOUNTER — Ambulatory Visit (INDEPENDENT_AMBULATORY_CARE_PROVIDER_SITE_OTHER): Payer: PRIVATE HEALTH INSURANCE | Admitting: Internal Medicine

## 2019-02-13 ENCOUNTER — Encounter: Payer: Self-pay | Admitting: Internal Medicine

## 2019-02-13 VITALS — BP 112/70 | HR 60 | Ht 62.0 in | Wt 146.8 lb

## 2019-02-13 DIAGNOSIS — E785 Hyperlipidemia, unspecified: Secondary | ICD-10-CM | POA: Diagnosis not present

## 2019-02-13 DIAGNOSIS — I25119 Atherosclerotic heart disease of native coronary artery with unspecified angina pectoris: Secondary | ICD-10-CM | POA: Diagnosis not present

## 2019-02-13 DIAGNOSIS — Z5181 Encounter for therapeutic drug level monitoring: Secondary | ICD-10-CM

## 2019-02-13 DIAGNOSIS — I493 Ventricular premature depolarization: Secondary | ICD-10-CM

## 2019-02-13 DIAGNOSIS — Z79899 Other long term (current) drug therapy: Secondary | ICD-10-CM

## 2019-02-13 MED ORDER — ICOSAPENT ETHYL 1 G PO CAPS: 2.0000 g | ORAL_CAPSULE | Freq: Two times a day (BID) | ORAL | 11 refills | Status: DC

## 2019-02-13 MED ORDER — FLECAINIDE ACETATE 100 MG PO TABS: 100.0000 mg | ORAL_TABLET | Freq: Two times a day (BID) | ORAL | 3 refills | Status: DC

## 2019-02-13 NOTE — Patient Instructions (Signed)
Medication Instructions:  STOP fish oil START vascepa 2 gram twice daily If you need a refill on your cardiac medications before your next appointment, please call your pharmacy.   Lab work: FASTING lab work to check cholesterol early December 2020 If you have labs (blood work) drawn today and your tests are completely normal, you will receive your results only by: Marland Kitchen MyChart Message (if you have MyChart) OR . A paper copy in the mail If you have any lab test that is abnormal or we need to change your treatment, we will call you to review the results.  Testing/Procedures: NONE  Follow-Up: At [facility], you and your health needs are our priority.  As part of our continuing mission to provide you with exceptional heart care, we have created designated Provider Care Teams.  These Care Teams include your primary Cardiologist (physician) and Advanced Practice Providers (APPs -  Physician Assistants and Nurse Practitioners) who all work together to provide you with the care you need, when you need it. You will need a follow up appointment in 6 months.  Please call our office 2 months in advance to schedule this appointment.  You may see Dr. Debara Pickett or one of the following Advanced Practice Providers on your designated Care Team: Almyra Deforest, [place] . Fabian Sharp, PA-C

## 2019-02-13 NOTE — Progress Notes (Signed)
OFFICE NOTE  Chief Complaint:  Routine follow-up  Primary Care Physician: Kathyrn Lass, MD  HPI:  [name] is a 63 year old female previously followed by Dr. Rex Kras with a history of lower extremity edema recently and chest discomfort in the spring which she underwent a stress test for that was negative for ischemia with a preserved EF of 71%. She has had no further episodes of chest pain, and her edema has all but resolved, however, she stayed on the hydrochlorothiazide 25 mg daily. She also has hypertension which has been well controlled and a history of breast cancer but is now cancer free. Recently she had a fall and fractured her right humerus and has not been as active from that but has since started to get back to some more activity. 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 She recently had laboratory work which shows a well controlled lipid profile, total cholesterol 185, triglycerides 286, HDL 36 and LDL 92. This is on fenofibrate. She has had a history of elevated triglycerides in the past, but is nondiabetic.  She was doing very well when I saw her last week and recommended a dental holiday. She was not having any significant palpitations or PVCs that she mentioned. Less than a week after her office visit, she reported a day where she felt "terrible all day". She reported having numerous PVCs throughout the day and she says that that completely "zaps her energy". The next day she felt very fatigued and today, 2 days later she still feels quite weakened. Her EKG in the office today shows no evidence of PVCs.  This is apparently been an ongoing problem for years for her, was advanced coming paroxysmally and being associated with extreme fatigue.  I had referred her to see Dr. Cristopher Peru with cardiac electrophysiology. He recommended starting her on low-dose flecainide. Since starting that she has reported feeling much better. She was  placed on a longer monitor which showed essentially sinus rhythm and 1 atrial couplet which she noted a skipped beat otherwise no paroxysmal atrial fibrillation, PVC's, nonsustained VT or other arrhythmias were noted. Dr. Lovena Le recommended weaning off atenolol and she is currently on flecainide 75 mg twice daily.  [name] returns today for followup. She reports her palpitations are essentially gone and flecainide. She denies any chest pain. Unfortunately she's had recurrent sinusitis and is currently on doxycycline. She's had issues with her sinuses for a long time and now is also having problems with her years. I recommended she see an ear nose and throat doctor. She has had improvement in her lipid profile on current medications. Her LDL particle number is down to 1480, from 1824. Calculated LDL is down to 80 from 116 and triglycerides are mildly elevated still at 191.  [name] returns today for follow-up of her palpitations. She reports a been generally well controlled on flecainide. She is currently on senna 5 mg twice daily. Her last stress test was in 2013. She denies any chest pain or worsening shortness of breath. On aspirin for anticoagulation, given her young age and infrequency of symptoms.b  I saw [name] back in the office today for follow-up. Overall she reports very good control of her palpitations. She denies any chest pain or shortness of breath. She reports her reflux symptoms are well controlled. She recently had a cholesterol check however that result is still pending. Were hoping to see an improvement in  cholesterol with the addition of fenofibrate. She is retired from [employer] job and now works full-time in [employer].   10/13/2015  [name] returns today for follow-up. She is without any complaints. She is overall doing well and continues to be active. She denies any recurrent atrial fibrillation. She is maintaining sinus rhythm at 63 with a QTC of 431 ms.  She is aware to avoid certain medications with her flecainide which is currently 75 mg twice a day. Cholesterol is been well controlled and is due for repeat check.  10/14/2016  [name] returns today for follow-up. She recently broke the great toe on her right foot. She is currently in a walking boot. Hopefully that will come off next week. She denies any chest pain or worsening shortness of breath. She says her PVCs are well controlled on flecainide. We reviewed her most recent lipid profile which demonstrates total cholesterol of 144, HDL-C 30, triglycerides 188, LDL-C of 86. LDL-P of 1194, non-HDL-C of 114 and ApoB of 82.    10/26/2017  [name] returns today for follow-up.  She underwent repeat lipid particle testing which showed LDL P of 1151, which is stable compared to her values last year.  She reports recently an increase in some PVCs, particularly exertionally and some recent shortness of breath when she was moving chairs at a funeral.  Blood pressure is well controlled.  She underwent a routine treadmill stress test given her flecainide use.  This did show some borderline abnormalities including 2 mm of J-point depression with upsloping depression at peak exercise and some mild horizontal ST segment depression less than 1 mm.  She was able to exercise for 9 minutes and 10 metabolic equivalents.  She did report shortness of breath and fatigue during the study.  These are new symptoms for her.  Is not clear whether this could be angina on the study is borderline abnormal.  01/12/2018  [name] returns for follow-up.  Recently she underwent exercise treadmill stress testing for flecainide and was found to have some borderline abnormalities including some J-point depression and horizontal ST segment depression.  She did report shortness of breath and fatigue during the study.  Subsequently she has had 3 separate episodes of acute onset chest discomfort which lasted only for a few minutes  then followed by PVCs.  All of these were associated with minimal exertion, such as pushing her granddaughter around in the stroller.  She was sent for a coronary CT angiogram.  12/13/2017 and demonstrated mild nonobstructive predominantly calcified plaque in the distal left main and ostial/proximal LAD between 25 and 50%.  Aggressive risk factor modification was recommended.  There is no obstructive coronary disease.  Coronary calcium score was 161.  Continues to have concerns about chest pain, although I try to reassure based on these findings.  I suspect she may be having symptomatic PVCs.  This was well controlled previously on flecainide however is more prevalent.  In addition today we discussed her lipid profile.  Her LDL-P remains elevated at 1151.  Her goal LDL-P is less than 700 and given her coronary artery disease, I think she is a good candidate to add PCSK9 inhibitor therapy.  Is currently on maximally tolerated therapy with simvastatin 20 mg, fenofibrate 145 and ezetimibe 10 mg daily.  02/13/2019  [name] is seen today in follow-up.  She seems to be doing pretty well.  This spring she had some kind of a viral illness with fever and diarrhea  that was determined to be a colitis.  She lost some weight with that and then ultimately became an overt diabetic.  She is now on metformin and blood sugars are better controlled.  She is lost some weight again after gaining it back and is just barely in the overweight category.  Blood pressure looks good today.  In fact overall she looks fairly well.  She had repeat lipid testing which showed LDL-P of 1202, LDL-C 57, triglycerides 181 and a small LDL P of 904 which is elevated.  Based on these findings, she does have some discordance in her lipid profile suggesting she is at persistent risk.  This is important because she was found to have some mild to moderate proximal to ostial LAD disease on CT scan which was calcified that we are treating aggressively.  At  this point her triglycerides remain elevated and this may also be a target of treatment.  She is on flecainide without recurrent PVCs.  We talked about the use of class Ic antiarrhythmics in patients with coronary disease.  I think were okay to continue it without active angina however I would reach out to Dr. Lovena Le to see if he is okay with continuing the flecainide.  PMHx:  Past Medical History:  Diagnosis Date  . Breast CA ([place])    right - radiation & surgery  . Diabetes ([facility])    Type II, diet control  . Diverticulosis   . Family history of heart disease   . Gastroparesis    mild  . GERD (gastroesophageal reflux disease)   . History of nuclear stress test 12/2011   bruce protocol; no evidence of ischemia, EF 79%, normal pattern of perfusion  . Hyperchloremia   . Hyperlipidemia   . Hypertension   . MVP (mitral valve prolapse)    last echo 07/1997 - normal LV size/function, mild MVP, mild MR  . PVC (premature ventricular contraction)   . Vitamin D deficiency     Past Surgical History:  Procedure Laterality Date  . ABDOMINAL HYSTERECTOMY  03/2003  . APPENDECTOMY  01/2002  . CARDIAC CATHETERIZATION  10/13/2009   no significant fixed obstructive CAD, normral ramus intermediate/circumflex/RCA; normal LV function (Dr. Corky Downs)  . MASTECTOMY Right 2006   right lumpectomy  . TONSILLECTOMY  1969    FAMHx:  Family History  Problem Relation Age of Onset  . Diabetes Maternal Grandmother   . Heart failure Maternal Grandmother   . Emphysema Maternal Grandfather   . Breast cancer Paternal Grandmother   . Heart disease Paternal Grandfather   . Alzheimer's disease Mother   . Heart failure Father   . Hypertension Father        also murmur, hyperlipidemia, MI, CABG age 29  . Diabetes Brother   . Heart disease Brother 35       MI at 57  . Hypertension Brother   . Hyperlipidemia Sister   . Breast cancer Maternal Aunt        PVCs  . Asthma Child        x2    SOCHx:   reports that  she has never smoked. She has never used smokeless tobacco. She reports current alcohol use. She reports that she does not use drugs.  ALLERGIES:  Allergies  Allergen Reactions  . Amoxicillin     REACTION: rash  . Chocolate   . Crestor [Rosuvastatin]     myalgias  . Erythromycin     REACTION: Nausea  . Morphine And  Related Nausea And Vomiting    ROS: A comprehensive review of systems was negative.  HOME MEDS: Current Outpatient Medications  Medication Sig Dispense Refill  . aspirin EC 81 MG tablet Take 81 mg by mouth daily.      . cholecalciferol (VITAMIN D) 1000 units tablet Take 1,000 Units by mouth daily.    Marland Kitchen esomeprazole (NEXIUM) 40 MG capsule Take 40 mg by mouth daily.     Marland Kitchen ezetimibe (ZETIA) 10 MG tablet TAKE 1 TABLET ONCE DAILY. (Patient taking differently: Take 10 mg by mouth daily. ) 90 tablet 1  . fenofibrate (TRICOR) 145 MG tablet TAKE 1 TABLET ONCE DAILY. 30 tablet 3  . fish oil-omega-3 fatty acids 1000 MG capsule Take 1 g by mouth 2 (two) times daily.      . flecainide (TAMBOCOR) 100 MG tablet Take 1 tablet (100 mg total) by mouth 2 (two) times daily. 180 tablet 3  . fluticasone (FLONASE) 50 MCG/ACT nasal spray Place 1 spray into both nostrils daily.    . Glucosamine-Chondroitin (COSAMIN DS PO) Take 1 tablet by mouth daily.    . hydrochlorothiazide (HYDRODIURIL) 25 MG tablet TAKE 1 TABLET BY MOUTH DAILY. 90 tablet 1  . hyoscyamine (LEVSIN SL) 0.125 MG SL tablet PLACE 1-2 TABLETS UNDER TONGUE EVERY FOUR HOURS AS NEEDED FOR ABDOMINAL PAIN. (Patient taking differently: Take 0.125-0.25 mg by mouth every 4 (four) hours as needed (abdominal pain). ) 30 tablet 11  . Multiple Vitamins-Minerals (MULTIPLE VITAMINS/WOMENS PO) Take 2 tablets by mouth daily.     . nitroGLYCERIN (NITROSTAT) 0.4 MG SL tablet Place 1 tablet (0.4 mg total) under the tongue every 5 (five) minutes as needed for chest pain. MAX 3 doses 25 tablet 3  . ondansetron (ZOFRAN ODT) 4 MG disintegrating tablet  Take 1 tablet (4 mg total) by mouth every 8 (eight) hours as needed for nausea or vomiting. 5 tablet 0  . oxyCODONE-acetaminophen (PERCOCET/ROXICET) 5-325 MG tablet Take 1 tablet by mouth every 6 (six) hours as needed for severe pain. 5 tablet 0  . PARoxetine (PAXIL-CR) 12.5 MG 24 hr tablet Take 12.5 mg by mouth every morning.      . potassium chloride SA (K-DUR,KLOR-CON) 20 MEQ tablet Take 1 tablet (20 mEq total) by mouth daily. 3 tablet 0  . simvastatin (ZOCOR) 20 MG tablet Take 1 tablet (20 mg total) by mouth daily at 6 PM. 90 tablet 0   No current facility-administered medications for this visit.     LABS/IMAGING: No results found for this or any previous visit (from the past 48 hour(s)). No results found.  VITALS: BP 112/70   Pulse 60   Ht 5\' 2"  (1.575 m)   Wt 146 lb 12.8 oz (66.6 kg)   BMI 26.85 kg/m   EXAM: General appearance: alert and no distress Neck: no carotid bruit, no JVD and thyroid not enlarged, symmetric, no tenderness/mass/nodules Lungs: clear to auscultation bilaterally Heart: regular rate and rhythm, S1, S2 normal, no murmur, click, rub or gallop Abdomen: soft, non-tender; bowel sounds normal; no masses,  no organomegaly and obese Extremities: extremities normal, atraumatic, no cyanosis or edema Pulses: 2+ and symmetric Skin: Skin color, texture, turgor normal. No rashes or lesions Neurologic: Grossly normal Psych: Pleasant  EKG: Normal sinus rhythm at 60, low voltage QRS, QTC 440 ms on flecainide-personally reviewed  ASSESSMENT: 1. CAD - mild to moderate calcified ostial/proximal LAD stenosis on cardiac CTA (12/2017) 2. Borderline abnormal exercise treadmill stress test 3. PVCs-markedly improved on flecainde. 4.  Hyperlipidemia 5. GERD  PLAN: 1.   [name] seems to be doing well and is asymptomatic.  Her PVCs have been suppressed.  She did have some nonobstructive coronary disease by cath and borderline abnormal exercise treadmill stress test.   Cholesterol is at target with regards to LDL however particle numbers in particular small particle numbers are elevated.  This might be consistent with her diabetes.  Her triglycerides are also elevated although A1c is better controlled around 7.  She may be a candidate for Vascepa based on her findings with a plan for aggressive medical therapy.  I would recommend that we pursue that and she could obviously stop over-the-counter fish oil.  She may be able to stop fenofibrate as well.  I will reach out to Dr. Lovena Le to see if it still reasonable to continue flecainide in the context of coronary disease.  Pixie Casino, MD, [facility], [place] Director of the Advanced Lipid Disorders &  Cardiovascular Risk Reduction Clinic Diplomate of the American Board of Clinical Lipidology Attending Cardiologist  Direct Dial: [phone]  Fax: [phone]  Website:  [email or web address]   [name] 02/13/2019, 8:10 AM

## 2019-02-20 ENCOUNTER — Telehealth: Payer: Self-pay | Admitting: Internal Medicine

## 2019-02-20 NOTE — Telephone Encounter (Signed)
PA for vascepa submitted via covermymeds.com ([employer])  ID: [id number] PCN: [id number] Grp: [id number]

## 2019-02-21 ENCOUNTER — Other Ambulatory Visit: Payer: Self-pay | Admitting: Internal Medicine

## 2019-02-21 NOTE — Telephone Encounter (Signed)
Per message from covermymeds.com, VASCEPA is not approved. No reasoning was provided on this portal   Routed to MD

## 2019-02-21 NOTE — Telephone Encounter (Signed)
Denial PDF loaded on covermymeds portal  Vascepa denied "drug is not covered on the pharmacy plan" and "OTC omega 3 is available to the patient as an out of pocket expense"  Appeal can be submitted to fax [phone] or call [phone] option 3  Appeal letter must state the length of time provider has treated patient for this medical issue, relevant diagnoses, diagnosis impact to overall quality of life, relevant medical history, tried & failed results, scientific literature to support the request

## 2019-03-08 NOTE — Telephone Encounter (Signed)
Vascepa approved - d/w Lonia Skinner, PharmD on 03/08/19 at 2:45 pm - he will fax PA to the office.  Dr. Lemmie Evens

## 2019-03-09 NOTE — Telephone Encounter (Signed)
Yes .. Dr. Lovena Le put her on that in 2016. I will message him to see if we should continue.

## 2019-03-09 NOTE — Telephone Encounter (Signed)
Patient notified of approval via MyChart

## 2019-03-12 ENCOUNTER — Telehealth: Payer: Self-pay | Admitting: Internal Medicine

## 2019-03-12 NOTE — Telephone Encounter (Signed)
Patient notified of MD advice via MyChart message 

## 2019-03-12 NOTE — Telephone Encounter (Signed)
-----   Message from Pixie Casino, MD sent at 03/09/2019 10:15 PM EDT ----- Regarding: RE: Flecainide Thanks Carleene Overlie - that seems very reasonable.  -[name] ----- Message ----- From: Evans Lance, MD Sent: 03/09/2019   9:46 PM EDT To: Pixie Casino, MD Subject: RE: Flecainide                                 [name], we do not know for sure the right answer. My practice has been when the patient does not have flow limiting lesions, the EF is normal, the QRS looks good, that is less than 120, and their rate is well controlled, I use flecainide.I have never had a sudden death on flecainide.  I would also add that aggressive statin therapy to reduce the likelihood of plaque rupture very important. Gt ----- Message ----- From: Pixie Casino, MD Sent: 03/09/2019   3:17 PM EDT To: Evans Lance, MD Subject: Flecainide                                     Carleene Overlie-  You put [name] on flecainide back in 2016 for PVC's - it has worked well, but we have subsequently found CAD by CT coronary angiogram. No coronary events or anginal symptoms. Is it safe to continue or do we have to consider something else.  Thanks.  -[name]

## 2019-03-13 NOTE — Telephone Encounter (Signed)
Per fax from [employer], vascepa is approved 03/09/2019 - 03/07/2020

## 2019-03-20 ENCOUNTER — Other Ambulatory Visit: Payer: Self-pay | Admitting: Internal Medicine

## 2019-04-17 ENCOUNTER — Other Ambulatory Visit: Payer: Self-pay

## 2019-04-17 DIAGNOSIS — Z20822 Contact with and (suspected) exposure to covid-19: Secondary | ICD-10-CM

## 2019-04-18 LAB — NOVEL CORONAVIRUS, NAA: SARS-CoV-2, NAA: NOT DETECTED

## 2019-05-25 LAB — NMR, LIPOPROFILE
Cholesterol, Total: 118 mg/dL (ref 100–199)
HDL Particle Number: 32.1 umol/L (ref >=30.5)
HDL-C: 34 mg/dL — ABNORMAL LOW (ref >39)
LDL Particle Number: 1162 nmol/L — ABNORMAL HIGH (ref <1000)
LDL Size: 19.8 nm — ABNORMAL LOW (ref >20.5)
LDL-C (NIH Calc): 57 mg/dL (ref 0–99)
LP-IR Score: 66 — ABNORMAL HIGH (ref <=45)
Small LDL Particle Number: 881 nmol/L — ABNORMAL HIGH (ref <=527)
Triglycerides: 156 mg/dL — ABNORMAL HIGH (ref 0–149)

## 2019-06-18 ENCOUNTER — Other Ambulatory Visit: Payer: Self-pay | Admitting: Internal Medicine

## 2019-07-05 DIAGNOSIS — E785 Hyperlipidemia, unspecified: Secondary | ICD-10-CM

## 2019-07-08 ENCOUNTER — Other Ambulatory Visit: Payer: Self-pay | Admitting: Internal Medicine

## 2019-08-03 LAB — NMR, LIPOPROFILE
Cholesterol, Total: 113 mg/dL (ref 100–199)
HDL Particle Number: 30.6 umol/L (ref >=30.5)
HDL-C: 30 mg/dL — ABNORMAL LOW (ref >39)
LDL Particle Number: 1093 nmol/L — ABNORMAL HIGH (ref <1000)
LDL Size: 19.9 nm — ABNORMAL LOW (ref >20.5)
LDL-C (NIH Calc): 53 mg/dL (ref 0–99)
LP-IR Score: 64 — ABNORMAL HIGH (ref <=45)
Small LDL Particle Number: 796 nmol/L — ABNORMAL HIGH (ref <=527)
Triglycerides: 178 mg/dL — ABNORMAL HIGH (ref 0–149)

## 2019-08-12 ENCOUNTER — Ambulatory Visit: Payer: Medicare PPO | Attending: Internal Medicine

## 2019-08-12 DIAGNOSIS — Z23 Encounter for immunization: Secondary | ICD-10-CM

## 2019-08-12 NOTE — Progress Notes (Signed)
   Covid-19 Vaccination Clinic  Name:  BRIGGETT MAJEWSKI    MRN: [id number] DOB: 02-24-1956  08/12/2019  Ms. Goans was observed post Covid-19 immunization for 30 minutes based on pre-vaccination screening without incidence. She was provided with Vaccine Information Sheet and instruction to access the V-Safe system.   Ms. Naramore was instructed to call 911 with any severe reactions post vaccine: Marland Kitchen Difficulty breathing  . Swelling of your face and throat  . A fast heartbeat  . A bad rash all over your body  . Dizziness and weakness    Immunizations Administered    Name Date Dose VIS Date Route   Pfizer COVID-19 Vaccine 08/12/2019  3:55 PM 0.3 mL 05/25/2019 Intramuscular   Manufacturer: [place]   Lot: [id number]   [place]: [id number]

## 2019-08-13 ENCOUNTER — Encounter: Payer: Self-pay | Admitting: Internal Medicine

## 2019-08-13 ENCOUNTER — Other Ambulatory Visit: Payer: Self-pay

## 2019-08-13 ENCOUNTER — Ambulatory Visit: Payer: Medicare PPO | Admitting: Internal Medicine

## 2019-08-13 VITALS — BP 120/81 | HR 65 | Temp 98.4°F | Ht 62.0 in | Wt 151.4 lb

## 2019-08-13 DIAGNOSIS — R9431 Abnormal electrocardiogram [ECG] [EKG]: Secondary | ICD-10-CM | POA: Diagnosis not present

## 2019-08-13 DIAGNOSIS — I493 Ventricular premature depolarization: Secondary | ICD-10-CM

## 2019-08-13 DIAGNOSIS — I25119 Atherosclerotic heart disease of native coronary artery with unspecified angina pectoris: Secondary | ICD-10-CM

## 2019-08-13 DIAGNOSIS — R0789 Other chest pain: Secondary | ICD-10-CM

## 2019-08-13 NOTE — Patient Instructions (Signed)
Medication Instructions:  Your physician recommends that you continue on your current medications as directed. Please refer to the Current Medication list given to you today.  *If you need a refill on your cardiac medications before your next appointment, please call your pharmacy*  Lab Work: NONE  Testing/Procedures: Your physician has requested that you have an exercise stress myoview. For further information please visit [email or web address]. Please follow instruction sheet, as given.  ---You will have to have a covid test 3 days prior   Follow-Up: At [facility], you and your health needs are our priority.  As part of our continuing mission to provide you with exceptional heart care, we have created designated Provider Care Teams.  These Care Teams include your primary Cardiologist (physician) and Advanced Practice Providers (APPs -  Physician Assistants and Nurse Practitioners) who all work together to provide you with the care you need, when you need it.  We recommend signing up for the patient portal called "MyChart".  Sign up information is provided on this After Visit Summary.  MyChart is used to connect with patients for Virtual Visits (Telemedicine).  Patients are able to view lab/test results, encounter notes, upcoming appointments, etc.  Non-urgent messages can be sent to your provider as well.   To learn more about what you can do with MyChart, go to [email or web address].    Your next appointment:   3 month(s)  The format for your next appointment:   In Person  Provider:   You may see Pixie Casino, MD or one of the following Advanced Practice Providers on your designated Care Team:    Almyra Deforest, PA-C  Fabian Sharp, PA-C or   Roby Lofts, [place]

## 2019-08-13 NOTE — Progress Notes (Signed)
OFFICE NOTE  Chief Complaint:  Routine follow-up  Primary Care Physician: Kathyrn Lass, MD  HPI:  [name] is a 64 year old female previously followed by Dr. Rex Kras with a history of lower extremity edema recently and chest discomfort in the spring which she underwent a stress test for that was negative for ischemia with a preserved EF of 71%. She has had no further episodes of chest pain, and her edema has all but resolved, however, she stayed on the hydrochlorothiazide 25 mg daily. She also has hypertension which has been well controlled and a history of breast cancer but is now cancer free. Recently she had a fall and fractured her right humerus and has not been as active from that but has since started to get back to some more activity. 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 She recently had laboratory work which shows a well controlled lipid profile, total cholesterol 185, triglycerides 286, HDL 36 and LDL 92. This is on fenofibrate. She has had a history of elevated triglycerides in the past, but is nondiabetic.  She was doing very well when I saw her last week and recommended a dental holiday. She was not having any significant palpitations or PVCs that she mentioned. Less than a week after her office visit, she reported a day where she felt "terrible all day". She reported having numerous PVCs throughout the day and she says that that completely "zaps her energy". The next day she felt very fatigued and today, 2 days later she still feels quite weakened. Her EKG in the office today shows no evidence of PVCs.  This is apparently been an ongoing problem for years for her, was advanced coming paroxysmally and being associated with extreme fatigue.  I had referred her to see Dr. Cristopher Peru with cardiac electrophysiology. He recommended starting her on low-dose flecainide. Since starting that she has reported feeling much better. She was  placed on a longer monitor which showed essentially sinus rhythm and 1 atrial couplet which she noted a skipped beat otherwise no paroxysmal atrial fibrillation, PVC's, nonsustained VT or other arrhythmias were noted. Dr. Lovena Le recommended weaning off atenolol and she is currently on flecainide 75 mg twice daily.  [name] returns today for followup. She reports her palpitations are essentially gone and flecainide. She denies any chest pain. Unfortunately she's had recurrent sinusitis and is currently on doxycycline. She's had issues with her sinuses for a long time and now is also having problems with her years. I recommended she see an ear nose and throat doctor. She has had improvement in her lipid profile on current medications. Her LDL particle number is down to 1480, from 1824. Calculated LDL is down to 80 from 116 and triglycerides are mildly elevated still at 191.  [name] returns today for follow-up of her palpitations. She reports a been generally well controlled on flecainide. She is currently on senna 5 mg twice daily. Her last stress test was in 2013. She denies any chest pain or worsening shortness of breath. On aspirin for anticoagulation, given her young age and infrequency of symptoms.b  I saw [name] back in the office today for follow-up. Overall she reports very good control of her palpitations. She denies any chest pain or shortness of breath. She reports her reflux symptoms are well controlled. She recently had a cholesterol check however that result is still pending. Were hoping to see an improvement in  cholesterol with the addition of fenofibrate. She is retired from [employer] job and now works full-time in [employer].   10/13/2015  [name] returns today for follow-up. She is without any complaints. She is overall doing well and continues to be active. She denies any recurrent atrial fibrillation. She is maintaining sinus rhythm at 63 with a QTC of 431 ms.  She is aware to avoid certain medications with her flecainide which is currently 75 mg twice a day. Cholesterol is been well controlled and is due for repeat check.  10/14/2016  [name] returns today for follow-up. She recently broke the great toe on her right foot. She is currently in a walking boot. Hopefully that will come off next week. She denies any chest pain or worsening shortness of breath. She says her PVCs are well controlled on flecainide. We reviewed her most recent lipid profile which demonstrates total cholesterol of 144, HDL-C 30, triglycerides 188, LDL-C of 86. LDL-P of 1194, non-HDL-C of 114 and ApoB of 82.    10/26/2017  [name] returns today for follow-up.  She underwent repeat lipid particle testing which showed LDL P of 1151, which is stable compared to her values last year.  She reports recently an increase in some PVCs, particularly exertionally and some recent shortness of breath when she was moving chairs at a funeral.  Blood pressure is well controlled.  She underwent a routine treadmill stress test given her flecainide use.  This did show some borderline abnormalities including 2 mm of J-point depression with upsloping depression at peak exercise and some mild horizontal ST segment depression less than 1 mm.  She was able to exercise for 9 minutes and 10 metabolic equivalents.  She did report shortness of breath and fatigue during the study.  These are new symptoms for her.  Is not clear whether this could be angina on the study is borderline abnormal.  01/12/2018  [name] returns for follow-up.  Recently she underwent exercise treadmill stress testing for flecainide and was found to have some borderline abnormalities including some J-point depression and horizontal ST segment depression.  She did report shortness of breath and fatigue during the study.  Subsequently she has had 3 separate episodes of acute onset chest discomfort which lasted only for a few minutes  then followed by PVCs.  All of these were associated with minimal exertion, such as pushing her granddaughter around in the stroller.  She was sent for a coronary CT angiogram.  12/13/2017 and demonstrated mild nonobstructive predominantly calcified plaque in the distal left main and ostial/proximal LAD between 25 and 50%.  Aggressive risk factor modification was recommended.  There is no obstructive coronary disease.  Coronary calcium score was 161.  Continues to have concerns about chest pain, although I try to reassure based on these findings.  I suspect she may be having symptomatic PVCs.  This was well controlled previously on flecainide however is more prevalent.  In addition today we discussed her lipid profile.  Her LDL-P remains elevated at 1151.  Her goal LDL-P is less than 700 and given her coronary artery disease, I think she is a good candidate to add PCSK9 inhibitor therapy.  Is currently on maximally tolerated therapy with simvastatin 20 mg, fenofibrate 145 and ezetimibe 10 mg daily.  02/13/2019  [name] is seen today in follow-up.  She seems to be doing pretty well.  This spring she had some kind of a viral illness with fever and diarrhea  that was determined to be a colitis.  She lost some weight with that and then ultimately became an overt diabetic.  She is now on metformin and blood sugars are better controlled.  She is lost some weight again after gaining it back and is just barely in the overweight category.  Blood pressure looks good today.  In fact overall she looks fairly well.  She had repeat lipid testing which showed LDL-P of 1202, LDL-C 57, triglycerides 181 and a small LDL P of 904 which is elevated.  Based on these findings, she does have some discordance in her lipid profile suggesting she is at persistent risk.  This is important because she was found to have some mild to moderate proximal to ostial LAD disease on CT scan which was calcified that we are treating aggressively.  At  this point her triglycerides remain elevated and this may also be a target of treatment.  She is on flecainide without recurrent PVCs.  We talked about the use of class Ic antiarrhythmics in patients with coronary disease.  I think were okay to continue it without active angina however I would reach out to Dr. Lovena Le to see if he is okay with continuing the flecainide.  08/13/2019  [name] is seen today in follow-up.  She recently had repeat lipid NMR which showed further improvement in her LDL-P and overall cholesterol profile.  Her hemoglobin A1c is down to 6.  Based on this it seems that she is improving with changes in her diet.  She reports less stress as she retired from [employer].  She is very concerned about her heart, however especially since she was found to have coronary disease by CT angiography.  She is remained on flecainide and reports recently an increase in her PVCs.  She says with exercise or exertion she notes increase in heart rate and PVCs with heart rates up to the 150s.  She does have 1/5 generation apple watch and she has been monitoring this closely.  She reports chest pressure during these episodes but no real chest pain.  Her last exercise treadmill stress test was in 2019.  PMHx:  Past Medical History:  Diagnosis Date  . Breast CA ([place])    right - radiation & surgery  . Diabetes (HCC)    Type II, diet control  . Diverticulosis   . Family history of heart disease   . Gastroparesis    mild  . GERD (gastroesophageal reflux disease)   . History of nuclear stress test 12/2011   bruce protocol; no evidence of ischemia, EF 79%, normal pattern of perfusion  . Hyperchloremia   . Hyperlipidemia   . Hypertension   . MVP (mitral valve prolapse)    last echo 07/1997 - normal LV size/function, mild MVP, mild MR  . PVC (premature ventricular contraction)   . Vitamin D deficiency     Past Surgical History:  Procedure Laterality Date  . ABDOMINAL HYSTERECTOMY  03/2003  .  APPENDECTOMY  01/2002  . CARDIAC CATHETERIZATION  10/13/2009   no significant fixed obstructive CAD, normral ramus intermediate/circumflex/RCA; normal LV function (Dr. Corky Downs)  . MASTECTOMY Right 2006   right lumpectomy  . TONSILLECTOMY  1969    FAMHx:  Family History  Problem Relation Age of Onset  . Diabetes Maternal Grandmother   . Heart failure Maternal Grandmother   . Emphysema Maternal Grandfather   . Breast cancer Paternal Grandmother   . Heart disease Paternal Grandfather   .  Alzheimer's disease Mother   . Heart failure Father   . Hypertension Father        also murmur, hyperlipidemia, MI, CABG age 56  . Diabetes Brother   . Heart disease Brother 40       MI at 36  . Hypertension Brother   . Hyperlipidemia Sister   . Breast cancer Maternal Aunt        PVCs  . Asthma Child        x2    SOCHx:   reports that she has never smoked. She has never used smokeless tobacco. She reports current alcohol use. She reports that she does not use drugs.  ALLERGIES:  Allergies  Allergen Reactions  . Cocoa Other (See Comments)    migraine  . Amoxicillin     REACTION: rash  . Chocolate   . Crestor [Rosuvastatin]     myalgias  . Erythromycin     REACTION: Nausea  . Morphine And Related Nausea And Vomiting  . Other Rash  . Sulfamethoxazole-Trimethoprim Nausea Only    ROS: A comprehensive review of systems was negative.  HOME MEDS: Current Outpatient Medications  Medication Sig Dispense Refill  . aspirin EC 81 MG tablet Take 81 mg by mouth daily.      . cholecalciferol (VITAMIN D) 1000 units tablet Take 1,000 Units by mouth daily.    Marland Kitchen esomeprazole (NEXIUM) 40 MG capsule Take 40 mg by mouth daily.     Marland Kitchen ezetimibe (ZETIA) 10 MG tablet TAKE 1 TABLET ONCE DAILY. 90 tablet 1  . flecainide (TAMBOCOR) 100 MG tablet Take 1 tablet (100 mg total) by mouth 2 (two) times daily. 180 tablet 3  . fluticasone (FLONASE) 50 MCG/ACT nasal spray Place 1 spray into both nostrils daily.     . Glucosamine-Chondroitin (COSAMIN DS PO) Take 1 tablet by mouth daily.    . hydrochlorothiazide (HYDRODIURIL) 25 MG tablet TAKE 1 TABLET BY MOUTH DAILY. 90 tablet 2  . hyoscyamine (LEVSIN SL) 0.125 MG SL tablet PLACE 1-2 TABLETS UNDER TONGUE EVERY FOUR HOURS AS NEEDED FOR ABDOMINAL PAIN. (Patient taking differently: Take 0.125-0.25 mg by mouth every 4 (four) hours as needed (abdominal pain). ) 30 tablet 11  . Icosapent Ethyl (VASCEPA PO) Take 1 g by mouth.     . metFORMIN (GLUCOPHAGE) 1000 MG tablet Take 2 tablets by mouth 2 (two) times daily.    . Multiple Vitamins-Minerals (MULTIPLE VITAMINS/WOMENS PO) Take 2 tablets by mouth daily.     . nitroGLYCERIN (NITROSTAT) 0.4 MG SL tablet Place 1 tablet (0.4 mg total) under the tongue every 5 (five) minutes as needed for chest pain. MAX 3 doses 25 tablet 3  . Omega-3 Fatty Acids (FISH OIL) 1000 MG CAPS Take by mouth.    Marland Kitchen OVER THE COUNTER MEDICATION cultrelle    . PARoxetine (PAXIL-CR) 12.5 MG 24 hr tablet Take 12.5 mg by mouth every morning.      . simvastatin (ZOCOR) 20 MG tablet TAKE 1 TABLET AT 6PM. 90 tablet 0   No current facility-administered medications for this visit.    LABS/IMAGING: No results found for this or any previous visit (from the past 48 hour(s)). No results found.  VITALS: BP 120/81   Pulse 65   Temp 98.4 F (36.9 C)   Ht 5\' 2"  (1.575 m)   Wt 151 lb 6.4 oz (68.7 kg)   SpO2 95%   BMI 27.69 kg/m   EXAM: General appearance: alert and no distress Neck: no carotid bruit,  no JVD and thyroid not enlarged, symmetric, no tenderness/mass/nodules Lungs: clear to auscultation bilaterally Heart: regular rate and rhythm, S1, S2 normal, no murmur, click, rub or gallop Abdomen: soft, non-tender; bowel sounds normal; no masses,  no organomegaly and obese Extremities: extremities normal, atraumatic, no cyanosis or edema Pulses: 2+ and symmetric Skin: Skin color, texture, turgor normal. No rashes or lesions Neurologic: Grossly  normal Psych: Pleasant  EKG: Normal sinus rhythm at 66, QTc 446 ms, QRSd 84 ms-personally reviewed  ASSESSMENT: 1. Chest pressure, paroxysmal tachycardia 2. CAD - mild to moderate calcified ostial/proximal LAD stenosis on cardiac CTA (12/2017) 3. Borderline abnormal exercise treadmill stress test 4. PVCs-markedly improved on flecainde. 5. Hyperlipidemia 6. GERD  PLAN: 1.   [name] has been having episodes of chest pressure and paroxysmal tachycardia.  She was found to have mild to moderate coronary disease by cardiac CTA in 2019.  She had a borderline abnormal exercise treadmill stress test at that time.  I like for her to undergo exercise Myoview stress testing to both assess flecainide effects on her EKG as well as global perfusion given some of her new symptoms.  If the study is nonischemic then I would refer back to Dr. Lovena Le to evaluate for breakthrough palpitations/tachycardia.  If obviously there is a reversible ischemic defect, then she would need a left heart catheterization.  She is agreeable with the plan.  Follow-up with me afterwards.  Pixie Casino, MD, [facility], [place] Director of the Advanced Lipid Disorders &  Cardiovascular Risk Reduction Clinic Diplomate of the American Board of Clinical Lipidology Attending Cardiologist  Direct Dial: [phone]  Fax: [phone]  Website:  [email or web address] 08/13/2019, 1:14 PM

## 2019-08-19 ENCOUNTER — Other Ambulatory Visit: Payer: Self-pay | Admitting: Internal Medicine

## 2019-08-20 ENCOUNTER — Other Ambulatory Visit (HOSPITAL_COMMUNITY)
Admission: RE | Admit: 2019-08-20 | Discharge: 2019-08-20 | Disposition: A | Discharge status: Home / Self Care | Payer: Medicare PPO | Source: Ambulatory Visit | Attending: Cardiology | Admitting: Cardiology

## 2019-08-20 DIAGNOSIS — Z20822 Contact with and (suspected) exposure to covid-19: Secondary | ICD-10-CM | POA: Insufficient documentation

## 2019-08-20 DIAGNOSIS — Z01812 Encounter for preprocedural laboratory examination: Secondary | ICD-10-CM | POA: Insufficient documentation

## 2019-08-21 ENCOUNTER — Telehealth (HOSPITAL_COMMUNITY): Payer: Self-pay

## 2019-08-21 LAB — SARS CORONAVIRUS 2 (TAT 6-24 HRS): SARS Coronavirus 2: NEGATIVE

## 2019-08-21 NOTE — Telephone Encounter (Signed)
Encounter complete. 

## 2019-08-23 ENCOUNTER — Ambulatory Visit (HOSPITAL_COMMUNITY)
Admission: RE | Admit: 2019-08-23 | Discharge: 2019-08-23 | Disposition: A | Discharge status: Home / Self Care | Payer: Medicare PPO | Source: Ambulatory Visit | Attending: Cardiology | Admitting: Cardiology

## 2019-08-23 ENCOUNTER — Other Ambulatory Visit: Payer: Self-pay

## 2019-08-23 DIAGNOSIS — I493 Ventricular premature depolarization: Secondary | ICD-10-CM | POA: Diagnosis not present

## 2019-08-23 DIAGNOSIS — I25119 Atherosclerotic heart disease of native coronary artery with unspecified angina pectoris: Secondary | ICD-10-CM | POA: Insufficient documentation

## 2019-08-23 LAB — MYOCARDIAL PERFUSION IMAGING
LV dias vol: 85 mL (ref 46–106)
LV sys vol: 29 mL
Peak HR: 104 {beats}/min
Rest HR: 75 {beats}/min
SDS: 0
SRS: 0
SSS: 0
TID: 1.05

## 2019-08-23 MED ORDER — AMINOPHYLLINE 25 MG/ML IV SOLN
75.0000 mg | Freq: Once | INTRAVENOUS | Status: AC
  Administered 2019-08-23: 75 mg via INTRAVENOUS

## 2019-08-23 MED ORDER — REGADENOSON 0.4 MG/5ML IV SOLN
0.4000 mg | Freq: Once | INTRAVENOUS | Status: AC
  Administered 2019-08-23: 0.4 mg via INTRAVENOUS

## 2019-08-23 MED ORDER — TECHNETIUM TC 99M TETROFOSMIN IV KIT
29.8000 | PACK | Freq: Once | INTRAVENOUS | Status: AC | PRN
  Administered 2019-08-23: 29.8 via INTRAVENOUS
  Filled 2019-08-23: qty 30

## 2019-08-23 MED ORDER — TECHNETIUM TC 99M TETROFOSMIN IV KIT
10.4000 | PACK | Freq: Once | INTRAVENOUS | Status: AC | PRN
  Administered 2019-08-23: 10.4 via INTRAVENOUS
  Filled 2019-08-23: qty 11

## 2019-08-28 ENCOUNTER — Telehealth: Payer: Self-pay | Admitting: Internal Medicine

## 2019-08-28 ENCOUNTER — Other Ambulatory Visit: Payer: Self-pay | Admitting: *Deleted

## 2019-08-28 DIAGNOSIS — I493 Ventricular premature depolarization: Secondary | ICD-10-CM

## 2019-08-28 NOTE — Telephone Encounter (Signed)
Pt was told to go back to Dr. Lovena Le for evaluation based on the results of her nuclear stress test. Due to the duration of time since her last visit with Dr. Lovena Le, Dr. Debara Pickett would need to send a new referral for the patient to be seen by Dr. Lovena Le

## 2019-08-28 NOTE — Telephone Encounter (Signed)
Left detailed message that EP referral has been done .Adonis Housekeeper

## 2019-09-11 ENCOUNTER — Ambulatory Visit: Payer: Medicare PPO | Attending: Internal Medicine

## 2019-09-11 DIAGNOSIS — Z23 Encounter for immunization: Secondary | ICD-10-CM

## 2019-09-11 NOTE — Progress Notes (Signed)
   Covid-19 Vaccination Clinic  Name:  [name]    MRN: [id number] DOB: May 16, 1956  09/11/2019  [name] was observed post Covid-19 immunization for 15 minutes without incident. She was provided with Vaccine Information Sheet and instruction to access the V-Safe system.   [name] was instructed to call 911 with any severe reactions post vaccine: Marland Kitchen Difficulty breathing  . Swelling of face and throat  . A fast heartbeat  . A bad rash all over body  . Dizziness and weakness   Immunizations Administered    Name Date Dose VIS Date Route   Pfizer COVID-19 Vaccine 09/11/2019  8:28 AM 0.3 mL 05/25/2019 Intramuscular   Manufacturer: [employer]   Lot: [id number]   [place]: [id number]

## 2019-09-14 ENCOUNTER — Encounter: Payer: Self-pay | Admitting: Internal Medicine

## 2019-09-14 ENCOUNTER — Telehealth: Payer: Self-pay | Admitting: Radiology

## 2019-09-14 ENCOUNTER — Other Ambulatory Visit: Payer: Self-pay

## 2019-09-14 ENCOUNTER — Ambulatory Visit: Payer: Medicare PPO | Admitting: Internal Medicine

## 2019-09-14 VITALS — BP 128/70 | HR 71 | Ht 62.0 in | Wt 148.0 lb

## 2019-09-14 DIAGNOSIS — I493 Ventricular premature depolarization: Secondary | ICD-10-CM

## 2019-09-14 DIAGNOSIS — R002 Palpitations: Secondary | ICD-10-CM

## 2019-09-14 NOTE — Telephone Encounter (Signed)
Enrolled patient for a 14 day Zio monitor to be mailed to patients home.  

## 2019-09-14 NOTE — Progress Notes (Signed)
HPI [name] returns today after a 4 year absence for evaluation of PVC's. She has been on flecainide for several years and this had done an initial nice job controlling her symptoms. She saw Dr. Debara Pickett a month ago with chest pressure and she had a nuclear stress test which demonstrated normal perfusion and normal EF. She has experienced more palpitations in the past few weeks, especially with exertion. She feels more sob when the spells occur and they usually last 5 minutes and go away with rest.  Allergies  Allergen Reactions  . Cocoa Other (See Comments)    migraine  . Amoxicillin     REACTION: rash  . Chocolate   . Crestor [Rosuvastatin]     myalgias  . Erythromycin     REACTION: Nausea  . Morphine And Related Nausea And Vomiting  . Other Rash  . Sulfamethoxazole-Trimethoprim Nausea Only     Current Outpatient Medications  Medication Sig Dispense Refill  . aspirin EC 81 MG tablet Take 81 mg by mouth daily.      . cholecalciferol (VITAMIN D) 1000 units tablet Take 1,000 Units by mouth daily.    Marland Kitchen esomeprazole (NEXIUM) 40 MG capsule Take 40 mg by mouth daily.     Marland Kitchen ezetimibe (ZETIA) 10 MG tablet TAKE 1 TABLET ONCE DAILY. 90 tablet 3  . flecainide (TAMBOCOR) 100 MG tablet Take 1 tablet (100 mg total) by mouth 2 (two) times daily. 180 tablet 3  . fluticasone (FLONASE) 50 MCG/ACT nasal spray Place 1 spray into both nostrils daily.    . Glucosamine-Chondroitin (COSAMIN DS PO) Take 1 tablet by mouth daily.    . hydrochlorothiazide (HYDRODIURIL) 25 MG tablet TAKE 1 TABLET BY MOUTH DAILY. 90 tablet 2  . hyoscyamine (LEVSIN SL) 0.125 MG SL tablet PLACE 1-2 TABLETS UNDER TONGUE EVERY FOUR HOURS AS NEEDED FOR ABDOMINAL PAIN. 30 tablet 11  . Icosapent Ethyl (VASCEPA PO) Take 1 g by mouth.     . Multiple Vitamins-Minerals (MULTIPLE VITAMINS/WOMENS PO) Take 2 tablets by mouth daily.     . Omega-3 Fatty Acids (FISH OIL) 1000 MG CAPS Take by mouth.    Marland Kitchen OVER THE COUNTER MEDICATION  cultrelle    . PARoxetine (PAXIL-CR) 12.5 MG 24 hr tablet Take 12.5 mg by mouth every morning.      . simvastatin (ZOCOR) 20 MG tablet TAKE 1 TABLET AT 6PM. 90 tablet 0  . metFORMIN (GLUCOPHAGE) 500 MG tablet Take 2 tablets by mouth daily.    . nitroGLYCERIN (NITROSTAT) 0.4 MG SL tablet Place 1 tablet (0.4 mg total) under the tongue every 5 (five) minutes as needed for chest pain. MAX 3 doses 25 tablet 3   No current facility-administered medications for this visit.     Past Medical History:  Diagnosis Date  . ANXIETY 11/07/2009   Qualifier: Diagnosis of  By: Trellis Paganini PA-c, Amy S   . Breast CA ([place])    right - radiation & surgery  . Diabetes (HCC)    Type II, diet control  . DIVERTICULOSIS, COLON 06/26/2004   Qualifier: Diagnosis of  By: Arsenio Loader RN, Ramond Craver    . DM2 (diabetes mellitus, type 2) ([place]) 11/07/2009   Qualifier: Diagnosis of  By: Trellis Paganini PA-c, Amy S   . Family history of heart disease   . GASTRIC POLYP 03/30/2007   Qualifier: Diagnosis of  By: Arsenio Loader RN, Ramond Craver    . Gastroparesis    mild  . GERD 11/07/2009   Qualifier: Diagnosis  of  By: Shane Crutch, Amy S   . History of nuclear stress test 12/2011   bruce protocol; no evidence of ischemia, EF 79%, normal pattern of perfusion  . Hyperchloremia   . Hyperlipidemia   . Hypertension   . HYPERTHYROIDISM 11/07/2009   Qualifier: Diagnosis of  By: Shane Crutch, Amy S   . Irritable bowel syndrome 08/04/2007   Qualifier: Diagnosis of  By: Arsenio Loader RN, Ramond Craver    . MVP (mitral valve prolapse)    last echo 07/1997 - normal LV size/function, mild MVP, mild MR  . OVARIAN CYST 08/04/2007   Qualifier: Diagnosis of  By: Arsenio Loader RN, Carissa    . PVC (premature ventricular contraction)   . Vitamin D deficiency     ROS:   All systems reviewed and negative except as noted in the HPI.   Past Surgical History:  Procedure Laterality Date  . ABDOMINAL HYSTERECTOMY  03/2003  . APPENDECTOMY  01/2002  . CARDIAC CATHETERIZATION   10/13/2009   no significant fixed obstructive CAD, normral ramus intermediate/circumflex/RCA; normal LV function (Dr. Corky Downs)  . MASTECTOMY Right 2006   right lumpectomy  . TONSILLECTOMY  1969     Family History  Problem Relation Age of Onset  . Diabetes Maternal Grandmother   . Heart failure Maternal Grandmother   . Emphysema Maternal Grandfather   . Breast cancer Paternal Grandmother   . Heart disease Paternal Grandfather   . Alzheimer's disease Mother   . Heart failure Father   . Hypertension Father        also murmur, hyperlipidemia, MI, CABG age 60  . Diabetes Brother   . Heart disease Brother 44       MI at 46  . Hypertension Brother   . Hyperlipidemia Sister   . Breast cancer Maternal Aunt        PVCs  . Asthma Child        x2     Social History   Socioeconomic History  . Marital status: Married    Spouse name: Not on file  . Number of children: 2  . Years of education: bachelors  . Highest education level: Not on file  Occupational History    Employer: [employer]  Tobacco Use  . Smoking status: Never Smoker  . Smokeless tobacco: Never Used  Substance and Sexual Activity  . Alcohol use: Yes  . Drug use: No  . Sexual activity: Not on file  Other Topics Concern  . Not on file  Social History Narrative  . Not on file   Social Determinants of Health   Financial Resource Strain:   . Difficulty of Paying Living Expenses:   Food Insecurity:   . Worried About Charity fundraiser in the Last Year:   . Arboriculturist in the Last Year:   Transportation Needs:   . Film/video editor (Medical):   Marland Kitchen Lack of Transportation (Non-Medical):   Physical Activity:   . Days of Exercise per Week:   . Minutes of Exercise per Session:   Stress:   . Feeling of Stress :   Social Connections:   . Frequency of Communication with Friends and Family:   . Frequency of Social Gatherings with Friends and Family:   . Attends Religious Services:   . Active Member  of Clubs or Organizations:   . Attends Archivist Meetings:   Marland Kitchen Marital Status:   Intimate Partner Violence:   . Fear of Current or Ex-Partner:   .  Emotionally Abused:   Marland Kitchen Physically Abused:   . Sexually Abused:      BP 128/70   Pulse 71   Ht 5\' 2"  (1.575 m)   Wt 148 lb (67.1 kg)   SpO2 98%   BMI 27.07 kg/m   Physical Exam:  Well appearing NAD HEENT: Unremarkable Neck:  No JVD, no thyromegally Lymphatics:  No adenopathy Back:  No CVA tenderness Lungs:  Clear with no wheezes HEART:  Regular rate rhythm, no murmurs, no rubs, no clicks Abd:  soft, positive bowel sounds, no organomegally, no rebound, no guarding Ext:  2 plus pulses, no edema, no cyanosis, no clubbing Skin:  No rashes no nodules Neuro:  CN II through XII intact, motor grossly intact  Assess/Plan: 1. Palpitations - I suspect that she is having break through PVC's with exertion. I have asked her to wear a 14 day zio patch. We will see if her palpitations are due to recurrent PVC's or something else. She will continue the flecainide. Based on the results of the monitor we will consider whether or not to switch to propafenone or add atenolol.  2. Chest pain - this may be related to her palpitations/arrhythmia.

## 2019-09-14 NOTE — Patient Instructions (Addendum)
Medication Instructions:  Your physician recommends that you continue on your current medications as directed. Please refer to the Current Medication list given to you today.  Labwork: None ordered.  Testing/Procedures: Your physician has recommended that you wear a holter monitor. Holter monitors are medical devices that record the heart's electrical activity. Doctors most often use these monitors to diagnose arrhythmias. Arrhythmias are problems with the speed or rhythm of the heartbeat. The monitor is a small, portable device. You can wear one while you do your normal daily activities. This is usually used to diagnose what is causing palpitations/syncope (passing out).   Follow-Up: Your physician wants you to follow-up in: based on results of heart monitor  Any Other Special Instructions Will Be Listed Below (If Applicable).  If you need a refill on your cardiac medications before your next appointment, please call your pharmacy.   ZIO XT- Long Term Monitor Instructions   Your physician has requested you wear your ZIO patch monitor_14_days.   This is a single patch monitor.  [employer] supplies one patch monitor per enrollment.  Additional stickers are not available.   Please do not apply patch if you will be having a Nuclear Stress Test, Echocardiogram, Cardiac CT, MRI, or Chest Xray during the time frame you would be wearing the monitor. The patch cannot be worn during these tests.  You cannot remove and re-apply the ZIO XT patch monitor.   Your ZIO patch monitor will be sent USPS Priority mail from [facility] directly to your home address. The monitor may also be mailed to a PO BOX if home delivery is not available.   It may take 3-5 days to receive your monitor after you have been enrolled.   Once you have received you monitor, please review enclosed instructions.  Your monitor has already been registered assigning a specific monitor serial # to you.   Applying the monitor    Shave hair from upper left chest.   Hold abrader disc by orange tab.  Rub abrader in 40 strokes over left upper chest as indicated in your monitor instructions.   Clean area with 4 enclosed alcohol pads .  Use all pads to assure are is cleaned thoroughly.  Let dry.   Apply patch as indicated in monitor instructions.  Patch will be place under collarbone on left side of chest with arrow pointing upward.   Rub patch adhesive wings for 2 minutes.Remove white label marked "1".  Remove white label marked "2".  Rub patch adhesive wings for 2 additional minutes.   While looking in a mirror, press and release button in center of patch.  A small green light will flash 3-4 times .  This will be your only indicator the monitor has been turned on.     Do not shower for the first 24 hours.  You may shower after the first 24 hours.   Press button if you feel a symptom. You will hear a small click.  Record Date, Time and Symptom in the Patient Log Book.   When you are ready to remove patch, follow instructions on last 2 pages of Patient Log Book.  Stick patch monitor onto last page of Patient Log Book.   Place Patient Log Book in [place] box.  Use locking tab on box and tape box closed securely.  The [employer] has [employer] on it.  Please place in mailbox as soon as possible.  Your physician should have your test results approximately 7  days after the monitor has been mailed back to [place].   Call [facility] at [phone] if you have questions regarding your ZIO XT patch monitor.  Call them immediately if you see an orange light blinking on your monitor.   If your monitor falls off in less than 4 days contact our Monitor department at [phone].  If your monitor becomes loose or falls off after 4 days call [employer] at [phone] for suggestions on securing your monitor.

## 2019-09-18 ENCOUNTER — Other Ambulatory Visit: Payer: Self-pay | Admitting: Internal Medicine

## 2019-09-18 NOTE — Telephone Encounter (Signed)
Rx request sent to pharmacy.  

## 2019-09-19 ENCOUNTER — Ambulatory Visit (INDEPENDENT_AMBULATORY_CARE_PROVIDER_SITE_OTHER): Payer: Medicare PPO

## 2019-09-19 DIAGNOSIS — I493 Ventricular premature depolarization: Secondary | ICD-10-CM | POA: Diagnosis not present

## 2019-09-19 DIAGNOSIS — R002 Palpitations: Secondary | ICD-10-CM

## 2019-10-22 DIAGNOSIS — R002 Palpitations: Secondary | ICD-10-CM | POA: Diagnosis not present

## 2019-10-22 DIAGNOSIS — I493 Ventricular premature depolarization: Secondary | ICD-10-CM | POA: Diagnosis not present

## 2019-10-25 DIAGNOSIS — M79644 Pain in right finger(s): Secondary | ICD-10-CM | POA: Diagnosis not present

## 2019-10-25 DIAGNOSIS — R2231 Localized swelling, mass and lump, right upper limb: Secondary | ICD-10-CM | POA: Diagnosis not present

## 2019-11-21 ENCOUNTER — Ambulatory Visit: Payer: Medicare PPO | Admitting: Internal Medicine

## 2019-11-21 ENCOUNTER — Encounter: Payer: Self-pay | Admitting: Internal Medicine

## 2019-11-21 ENCOUNTER — Other Ambulatory Visit: Payer: Self-pay

## 2019-11-21 VITALS — BP 142/84 | HR 60 | Ht 62.0 in | Wt 151.2 lb

## 2019-11-21 DIAGNOSIS — I493 Ventricular premature depolarization: Secondary | ICD-10-CM | POA: Diagnosis not present

## 2019-11-21 DIAGNOSIS — R002 Palpitations: Secondary | ICD-10-CM

## 2019-11-21 DIAGNOSIS — E785 Hyperlipidemia, unspecified: Secondary | ICD-10-CM

## 2019-11-21 NOTE — Progress Notes (Signed)
OFFICE NOTE  Chief Complaint:  Routine follow-up  Primary Care Physician: Kathyrn Lass, MD  HPI:  [name] is a 64 year old female previously followed by Dr. Rex Kras with a history of lower extremity edema recently and chest discomfort in the spring which she underwent a stress test for that was negative for ischemia with a preserved EF of 71%. She has had no further episodes of chest pain, and her edema has all but resolved, however, she stayed on the hydrochlorothiazide 25 mg daily. She also has hypertension which has been well controlled and a history of breast cancer but is now cancer free. Recently she had a fall and fractured her right humerus and has not been as active from that but has since started to get back to some more activity. 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 She recently had laboratory work which shows a well controlled lipid profile, total cholesterol 185, triglycerides 286, HDL 36 and LDL 92. This is on fenofibrate. She has had a history of elevated triglycerides in the past, but is nondiabetic.  She was doing very well when I saw her last week and recommended a dental holiday. She was not having any significant palpitations or PVCs that she mentioned. Less than a week after her office visit, she reported a day where she felt "terrible all day". She reported having numerous PVCs throughout the day and she says that that completely "zaps her energy". The next day she felt very fatigued and today, 2 days later she still feels quite weakened. Her EKG in the office today shows no evidence of PVCs.  This is apparently been an ongoing problem for years for her, was advanced coming paroxysmally and being associated with extreme fatigue.  I had referred her to see Dr. Cristopher Peru with cardiac electrophysiology. He recommended starting her on low-dose flecainide. Since starting that she has reported feeling much better. She was  placed on a longer monitor which showed essentially sinus rhythm and 1 atrial couplet which she noted a skipped beat otherwise no paroxysmal atrial fibrillation, PVC's, nonsustained VT or other arrhythmias were noted. Dr. Lovena Le recommended weaning off atenolol and she is currently on flecainide 75 mg twice daily.  [name] returns today for followup. She reports her palpitations are essentially gone and flecainide. She denies any chest pain. Unfortunately she's had recurrent sinusitis and is currently on doxycycline. She's had issues with her sinuses for a long time and now is also having problems with her years. I recommended she see an ear nose and throat doctor. She has had improvement in her lipid profile on current medications. Her LDL particle number is down to 1480, from 1824. Calculated LDL is down to 80 from 116 and triglycerides are mildly elevated still at 191.  [name] returns today for follow-up of her palpitations. She reports a been generally well controlled on flecainide. She is currently on senna 5 mg twice daily. Her last stress test was in 2013. She denies any chest pain or worsening shortness of breath. On aspirin for anticoagulation, given her young age and infrequency of symptoms.b  I saw [name] back in the office today for follow-up. Overall she reports very good control of her palpitations. She denies any chest pain or shortness of breath. She reports her reflux symptoms are well controlled. She recently had a cholesterol check however that result is still pending. Were hoping to see an improvement in  cholesterol with the addition of fenofibrate. She is retired from [employer] job and now works full-time in [employer].   10/13/2015  [name] returns today for follow-up. She is without any complaints. She is overall doing well and continues to be active. She denies any recurrent atrial fibrillation. She is maintaining sinus rhythm at 63 with a QTC of 431 ms.  She is aware to avoid certain medications with her flecainide which is currently 75 mg twice a day. Cholesterol is been well controlled and is due for repeat check.  10/14/2016  [name] returns today for follow-up. She recently broke the great toe on her right foot. She is currently in a walking boot. Hopefully that will come off next week. She denies any chest pain or worsening shortness of breath. She says her PVCs are well controlled on flecainide. We reviewed her most recent lipid profile which demonstrates total cholesterol of 144, HDL-C 30, triglycerides 188, LDL-C of 86. LDL-P of 1194, non-HDL-C of 114 and ApoB of 82.    10/26/2017  [name] returns today for follow-up.  She underwent repeat lipid particle testing which showed LDL P of 1151, which is stable compared to her values last year.  She reports recently an increase in some PVCs, particularly exertionally and some recent shortness of breath when she was moving chairs at a funeral.  Blood pressure is well controlled.  She underwent a routine treadmill stress test given her flecainide use.  This did show some borderline abnormalities including 2 mm of J-point depression with upsloping depression at peak exercise and some mild horizontal ST segment depression less than 1 mm.  She was able to exercise for 9 minutes and 10 metabolic equivalents.  She did report shortness of breath and fatigue during the study.  These are new symptoms for her.  Is not clear whether this could be angina on the study is borderline abnormal.  01/12/2018  [name] returns for follow-up.  Recently she underwent exercise treadmill stress testing for flecainide and was found to have some borderline abnormalities including some J-point depression and horizontal ST segment depression.  She did report shortness of breath and fatigue during the study.  Subsequently she has had 3 separate episodes of acute onset chest discomfort which lasted only for a few minutes  then followed by PVCs.  All of these were associated with minimal exertion, such as pushing her granddaughter around in the stroller.  She was sent for a coronary CT angiogram.  12/13/2017 and demonstrated mild nonobstructive predominantly calcified plaque in the distal left main and ostial/proximal LAD between 25 and 50%.  Aggressive risk factor modification was recommended.  There is no obstructive coronary disease.  Coronary calcium score was 161.  Continues to have concerns about chest pain, although I try to reassure based on these findings.  I suspect she may be having symptomatic PVCs.  This was well controlled previously on flecainide however is more prevalent.  In addition today we discussed her lipid profile.  Her LDL-P remains elevated at 1151.  Her goal LDL-P is less than 700 and given her coronary artery disease, I think she is a good candidate to add PCSK9 inhibitor therapy.  Is currently on maximally tolerated therapy with simvastatin 20 mg, fenofibrate 145 and ezetimibe 10 mg daily.  02/13/2019  [name] is seen today in follow-up.  She seems to be doing pretty well.  This spring she had some kind of a viral illness with fever and diarrhea  that was determined to be a colitis.  She lost some weight with that and then ultimately became an overt diabetic.  She is now on metformin and blood sugars are better controlled.  She is lost some weight again after gaining it back and is just barely in the overweight category.  Blood pressure looks good today.  In fact overall she looks fairly well.  She had repeat lipid testing which showed LDL-P of 1202, LDL-C 57, triglycerides 181 and a small LDL P of 904 which is elevated.  Based on these findings, she does have some discordance in her lipid profile suggesting she is at persistent risk.  This is important because she was found to have some mild to moderate proximal to ostial LAD disease on CT scan which was calcified that we are treating aggressively.  At  this point her triglycerides remain elevated and this may also be a target of treatment.  She is on flecainide without recurrent PVCs.  We talked about the use of class Ic antiarrhythmics in patients with coronary disease.  I think were okay to continue it without active angina however I would reach out to Dr. Lovena Le to see if he is okay with continuing the flecainide.  08/13/2019  [name] is seen today in follow-up.  She recently had repeat lipid NMR which showed further improvement in her LDL-P and overall cholesterol profile.  Her hemoglobin A1c is down to 6.  Based on this it seems that she is improving with changes in her diet.  She reports less stress as she retired from [employer].  She is very concerned about her heart, however especially since she was found to have coronary disease by CT angiography.  She is remained on flecainide and reports recently an increase in her PVCs.  She says with exercise or exertion she notes increase in heart rate and PVCs with heart rates up to the 150s.  She does have 1/5 generation apple watch and she has been monitoring this closely.  She reports chest pressure during these episodes but no real chest pain.  Her last exercise treadmill stress test was in 2019.  6/92021  [name] is seen today in follow-up.  Overall she is doing well.  She had been seen by Dr. Lovena Le and wore monitor for possible increase in PVCs however during that period of monitoring there was no significant arrhythmia on the monitor was significantly normal.  She says she has been doing better since retiring from [employer].  She seems to be more relaxed.  Lipids were stable recently if not slightly improved.  Her blood pressure was minimally elevated today.  PMHx:  Past Medical History:  Diagnosis Date  . ANXIETY 11/07/2009   Qualifier: Diagnosis of  By: Trellis Paganini PA-c, Amy S   . Breast CA ([place])    right - radiation & surgery  . Diabetes (HCC)    Type II, diet control  .  DIVERTICULOSIS, COLON 06/26/2004   Qualifier: Diagnosis of  By: Arsenio Loader RN, Ramond Craver    . DM2 (diabetes mellitus, type 2) ([place]) 11/07/2009   Qualifier: Diagnosis of  By: Trellis Paganini PA-c, Amy S   . Family history of heart disease   . GASTRIC POLYP 03/30/2007   Qualifier: Diagnosis of  By: Arsenio Loader RN, Ramond Craver    . Gastroparesis    mild  . GERD 11/07/2009   Qualifier: Diagnosis of  By: Trellis Paganini PA-c, Amy S   . History of nuclear stress test 12/2011   bruce  protocol; no evidence of ischemia, EF 79%, normal pattern of perfusion  . Hyperchloremia   . Hyperlipidemia   . Hypertension   . HYPERTHYROIDISM 11/07/2009   Qualifier: Diagnosis of  By: Shane Crutch, Amy S   . Irritable bowel syndrome 08/04/2007   Qualifier: Diagnosis of  By: Arsenio Loader RN, Ramond Craver    . MVP (mitral valve prolapse)    last echo 07/1997 - normal LV size/function, mild MVP, mild MR  . OVARIAN CYST 08/04/2007   Qualifier: Diagnosis of  By: Arsenio Loader RN, Carissa    . PVC (premature ventricular contraction)   . Vitamin D deficiency     Past Surgical History:  Procedure Laterality Date  . ABDOMINAL HYSTERECTOMY  03/2003  . APPENDECTOMY  01/2002  . CARDIAC CATHETERIZATION  10/13/2009   no significant fixed obstructive CAD, normral ramus intermediate/circumflex/RCA; normal LV function (Dr. Corky Downs)  . MASTECTOMY Right 2006   right lumpectomy  . TONSILLECTOMY  1969    FAMHx:  Family History  Problem Relation Age of Onset  . Diabetes Maternal Grandmother   . Heart failure Maternal Grandmother   . Emphysema Maternal Grandfather   . Breast cancer Paternal Grandmother   . Heart disease Paternal Grandfather   . Alzheimer's disease Mother   . Heart failure Father   . Hypertension Father        also murmur, hyperlipidemia, MI, CABG age 76  . Diabetes Brother   . Heart disease Brother 59       MI at 81  . Hypertension Brother   . Hyperlipidemia Sister   . Breast cancer Maternal Aunt        PVCs  . Asthma Child        x2     SOCHx:   reports that she has never smoked. She has never used smokeless tobacco. She reports current alcohol use. She reports that she does not use drugs.  ALLERGIES:  Allergies  Allergen Reactions  . Cocoa Other (See Comments)    migraine  . Amoxicillin     REACTION: rash  . Chocolate   . Crestor [Rosuvastatin]     myalgias  . Erythromycin     REACTION: Nausea  . Morphine And Related Nausea And Vomiting  . Other Rash  . Sulfamethoxazole-Trimethoprim Nausea Only    ROS: A comprehensive review of systems was negative.  HOME MEDS: Current Outpatient Medications  Medication Sig Dispense Refill  . aspirin EC 81 MG tablet Take 81 mg by mouth daily.      . cholecalciferol (VITAMIN D) 1000 units tablet Take 1,000 Units by mouth daily.    Marland Kitchen esomeprazole (NEXIUM) 40 MG capsule Take 40 mg by mouth daily.     Marland Kitchen ezetimibe (ZETIA) 10 MG tablet TAKE 1 TABLET ONCE DAILY. 90 tablet 3  . flecainide (TAMBOCOR) 100 MG tablet Take 1 tablet (100 mg total) by mouth 2 (two) times daily. 180 tablet 3  . fluticasone (FLONASE) 50 MCG/ACT nasal spray Place 1 spray into both nostrils daily.    . Glucosamine-Chondroitin (COSAMIN DS PO) Take 1 tablet by mouth daily.    . hydrochlorothiazide (HYDRODIURIL) 25 MG tablet TAKE 1 TABLET BY MOUTH DAILY. 90 tablet 2  . hyoscyamine (LEVSIN SL) 0.125 MG SL tablet PLACE 1-2 TABLETS UNDER TONGUE EVERY FOUR HOURS AS NEEDED FOR ABDOMINAL PAIN. 30 tablet 11  . icosapent Ethyl (VASCEPA) 1 g capsule Take 2 g by mouth 2 (two) times daily.    . metFORMIN (GLUCOPHAGE) 500 MG tablet Take  2 tablets by mouth daily.    . Multiple Vitamins-Minerals (MULTIPLE VITAMINS/WOMENS PO) Take 2 tablets by mouth daily.     . nitroGLYCERIN (NITROSTAT) 0.4 MG SL tablet Place 1 tablet (0.4 mg total) under the tongue every 5 (five) minutes as needed for chest pain. MAX 3 doses 25 tablet 3  . Omega-3 Fatty Acids (FISH OIL) 1000 MG CAPS Take by mouth.    Marland Kitchen OVER THE COUNTER MEDICATION  cultrelle    . PARoxetine (PAXIL-CR) 12.5 MG 24 hr tablet Take 12.5 mg by mouth every morning.      . simvastatin (ZOCOR) 20 MG tablet TAKE 1 TABLET AT 6PM. 90 tablet 0   No current facility-administered medications for this visit.    LABS/IMAGING: No results found for this or any previous visit (from the past 48 hour(s)). No results found.  VITALS: BP (!) 142/84   Pulse 60   Ht 5\' 2"  (1.575 m)   Wt 151 lb 3.2 oz (68.6 kg)   BMI 27.65 kg/m   EXAM: General appearance: alert and no distress Neck: no carotid bruit, no JVD and thyroid not enlarged, symmetric, no tenderness/mass/nodules Lungs: clear to auscultation bilaterally Heart: regular rate and rhythm, S1, S2 normal, no murmur, click, rub or gallop Abdomen: soft, non-tender; bowel sounds normal; no masses,  no organomegaly and obese Extremities: extremities normal, atraumatic, no cyanosis or edema Pulses: 2+ and symmetric Skin: Skin color, texture, turgor normal. No rashes or lesions Neurologic: Grossly normal Psych: Pleasant  EKG: Normal sinus rhythm at 60, low voltage, QTC 442 ms-personally reviewed  ASSESSMENT: 1. CAD - mild to moderate calcified ostial/proximal LAD stenosis on cardiac CTA (12/2017) 2. Borderline abnormal exercise treadmill stress test 3. PVCs-markedly improved on flecainde. 4. Hyperlipidemia 5. GERD  PLAN: 1.   [name] seems to be doing fairly well without recurrent chest pressure or worsening shortness of breath.  She denies any worsening palpitations currently although had recently had some palpitations and was monitored without significant findings.  I agree with continuing her current dose of flecainide which appears to be well-tolerated.  If she has breakthrough arrhythmias could consider addition of a beta-blocker.  Follow-up with me annually or sooner as necessary.  Pixie Casino, MD, [facility], [place] Director of the Advanced Lipid Disorders &   Cardiovascular Risk Reduction Clinic Diplomate of the American Board of Clinical Lipidology Attending Cardiologist  Direct Dial: [phone]  Fax: [phone]  Website:  [email or web address] 11/21/2019, 11:25 PM

## 2019-11-21 NOTE — Patient Instructions (Signed)
Medication Instructions:  Your physician recommends that you continue on your current medications as directed. Please refer to the Current Medication list given to you today.  *If you need a refill on your cardiac medications before your next appointment, please call your pharmacy*   Lab Work: FASTING lab work before next appointment   If you have labs (blood work) drawn today and your tests are completely normal, you will receive your results only by: Marland Kitchen MyChart Message (if you have MyChart) OR . A paper copy in the mail If you have any lab test that is abnormal or we need to change your treatment, we will call you to review the results.   Testing/Procedures: NONE   Follow-Up: At [facility], you and your health needs are our priority.  As part of our continuing mission to provide you with exceptional heart care, we have created designated Provider Care Teams.  These Care Teams include your primary Cardiologist (physician) and Advanced Practice Providers (APPs -  Physician Assistants and Nurse Practitioners) who all work together to provide you with the care you need, when you need it.  We recommend signing up for the patient portal called "MyChart".  Sign up information is provided on this After Visit Summary.  MyChart is used to connect with patients for Virtual Visits (Telemedicine).  Patients are able to view lab/test results, encounter notes, upcoming appointments, etc.  Non-urgent messages can be sent to your provider as well.   To learn more about what you can do with MyChart, go to [email or web address].    Your next appointment:   12 month(s)  The format for your next appointment:   In Person  Provider:   You may see Pixie Casino, MD or one of the following Advanced Practice Providers on your designated Care Team:    Almyra Deforest, PA-C  Fabian Sharp, PA-C or   Roby Lofts, [place]    Other Instructions

## 2019-11-22 DIAGNOSIS — M79644 Pain in right finger(s): Secondary | ICD-10-CM | POA: Diagnosis not present

## 2019-11-22 DIAGNOSIS — R2231 Localized swelling, mass and lump, right upper limb: Secondary | ICD-10-CM | POA: Diagnosis not present

## 2019-12-11 DIAGNOSIS — H524 Presbyopia: Secondary | ICD-10-CM | POA: Diagnosis not present

## 2019-12-11 DIAGNOSIS — E119 Type 2 diabetes mellitus without complications: Secondary | ICD-10-CM | POA: Diagnosis not present

## 2019-12-11 DIAGNOSIS — H2513 Age-related nuclear cataract, bilateral: Secondary | ICD-10-CM | POA: Diagnosis not present

## 2019-12-11 DIAGNOSIS — H43813 Vitreous degeneration, bilateral: Secondary | ICD-10-CM | POA: Diagnosis not present

## 2019-12-17 ENCOUNTER — Other Ambulatory Visit: Payer: Self-pay | Admitting: Internal Medicine

## 2020-01-01 DIAGNOSIS — N6320 Unspecified lump in the left breast, unspecified quadrant: Secondary | ICD-10-CM | POA: Diagnosis not present

## 2020-01-02 ENCOUNTER — Other Ambulatory Visit: Payer: Self-pay | Admitting: Obstetrics and Gynecology

## 2020-01-02 DIAGNOSIS — N6320 Unspecified lump in the left breast, unspecified quadrant: Secondary | ICD-10-CM

## 2020-01-11 ENCOUNTER — Other Ambulatory Visit: Payer: Self-pay | Admitting: Obstetrics and Gynecology

## 2020-01-11 ENCOUNTER — Other Ambulatory Visit: Payer: Self-pay

## 2020-01-11 ENCOUNTER — Ambulatory Visit
Admission: RE | Admit: 2020-01-11 | Discharge: 2020-01-11 | Disposition: A | Discharge status: Home / Self Care | Payer: Medicare PPO | Source: Ambulatory Visit | Attending: Obstetrics and Gynecology | Admitting: Obstetrics and Gynecology

## 2020-01-11 DIAGNOSIS — N6320 Unspecified lump in the left breast, unspecified quadrant: Secondary | ICD-10-CM

## 2020-01-11 DIAGNOSIS — R922 Inconclusive mammogram: Secondary | ICD-10-CM | POA: Diagnosis not present

## 2020-01-11 DIAGNOSIS — N6489 Other specified disorders of breast: Secondary | ICD-10-CM | POA: Diagnosis not present

## 2020-01-11 HISTORY — DX: Personal history of irradiation: Z92.3

## 2020-01-17 DIAGNOSIS — E1122 Type 2 diabetes mellitus with diabetic chronic kidney disease: Secondary | ICD-10-CM | POA: Diagnosis not present

## 2020-01-17 DIAGNOSIS — E782 Mixed hyperlipidemia: Secondary | ICD-10-CM | POA: Diagnosis not present

## 2020-01-17 DIAGNOSIS — E538 Deficiency of other specified B group vitamins: Secondary | ICD-10-CM | POA: Diagnosis not present

## 2020-01-21 DIAGNOSIS — Z6828 Body mass index (BMI) 28.0-28.9, adult: Secondary | ICD-10-CM | POA: Diagnosis not present

## 2020-01-21 DIAGNOSIS — N183 Chronic kidney disease, stage 3 unspecified: Secondary | ICD-10-CM | POA: Diagnosis not present

## 2020-01-21 DIAGNOSIS — Z0000 Encounter for general adult medical examination without abnormal findings: Secondary | ICD-10-CM | POA: Diagnosis not present

## 2020-01-21 DIAGNOSIS — E538 Deficiency of other specified B group vitamins: Secondary | ICD-10-CM | POA: Diagnosis not present

## 2020-01-21 DIAGNOSIS — K2100 Gastro-esophageal reflux disease with esophagitis, without bleeding: Secondary | ICD-10-CM | POA: Diagnosis not present

## 2020-01-21 DIAGNOSIS — E0822 Diabetes mellitus due to underlying condition with diabetic chronic kidney disease: Secondary | ICD-10-CM | POA: Diagnosis not present

## 2020-02-11 ENCOUNTER — Other Ambulatory Visit: Payer: Self-pay | Admitting: Internal Medicine

## 2020-02-19 ENCOUNTER — Other Ambulatory Visit: Payer: Self-pay | Admitting: Internal Medicine

## 2020-03-12 ENCOUNTER — Other Ambulatory Visit: Payer: Self-pay | Admitting: Internal Medicine

## 2020-03-13 DIAGNOSIS — Z1272 Encounter for screening for malignant neoplasm of vagina: Secondary | ICD-10-CM | POA: Diagnosis not present

## 2020-03-13 DIAGNOSIS — Z1231 Encounter for screening mammogram for malignant neoplasm of breast: Secondary | ICD-10-CM | POA: Diagnosis not present

## 2020-03-13 DIAGNOSIS — Z01419 Encounter for gynecological examination (general) (routine) without abnormal findings: Secondary | ICD-10-CM | POA: Diagnosis not present

## 2020-03-28 ENCOUNTER — Other Ambulatory Visit: Payer: Self-pay | Admitting: Internal Medicine

## 2020-04-01 ENCOUNTER — Other Ambulatory Visit: Payer: Self-pay | Admitting: Internal Medicine

## 2020-04-02 NOTE — Telephone Encounter (Signed)
Rx(s) sent to pharmacy electronically.  

## 2020-05-12 ENCOUNTER — Other Ambulatory Visit: Payer: Self-pay | Admitting: Internal Medicine

## 2020-05-16 DIAGNOSIS — R55 Syncope and collapse: Secondary | ICD-10-CM | POA: Diagnosis not present

## 2020-05-16 DIAGNOSIS — J329 Chronic sinusitis, unspecified: Secondary | ICD-10-CM | POA: Diagnosis not present

## 2020-05-19 ENCOUNTER — Telehealth: Payer: Self-pay | Admitting: Internal Medicine

## 2020-05-19 NOTE — Telephone Encounter (Signed)
Friday while at the vets office with her dog, while standing at the table she suddenly got woozy and things were fading. She sat down and was sweating badly, her hair was wet with sweat. She reports the room was spinning and she felt dizzy. The vet reported her face was very red. Later at home her blood sugar was 138 and bp was 168/93. She went to her medical doctors office but was not really seen and was given antibiotics for her sinus infection. The dizziness continued until Saturday afternoon. Her bp Saturday was 140/80 and today it is back to normal at 117/72. She feels like she may have been dehydrated and has been drinking water and pedolite since then. She did not have palpitations or other symptoms. Her apple watch reports her heart rate during the time was 70 to 91 bpm. She wanted to let dr hilty know what happened.

## 2020-05-19 NOTE — Telephone Encounter (Signed)
Pt called in to fu on a mychart message sent in this morning   Good morning, Something happened on Friday morning (almost fainted and sweating profusely) and I would like to talk with someone about it. (I did try to get into to see my (a) GP that day but it didnt work out. I can explain that too.). I just want to talk about it and get it in my record. FYI - I am alright and I am taking doxycycline for a sinus infection. Thanks so much! Home-[phone] [phone] -after 2pm [name] 1956-03-11

## 2020-05-20 NOTE — Telephone Encounter (Signed)
Spoke with pt, aware of dr hilty's recommendations.  

## 2020-05-20 NOTE — Telephone Encounter (Signed)
Thanks Hilda Blades - can follow-up with her PCP About this  Dr Lemmie Evens

## 2020-05-22 DIAGNOSIS — J0140 Acute pansinusitis, unspecified: Secondary | ICD-10-CM | POA: Diagnosis not present

## 2020-05-22 DIAGNOSIS — R5383 Other fatigue: Secondary | ICD-10-CM | POA: Diagnosis not present

## 2020-05-27 DIAGNOSIS — R1013 Epigastric pain: Secondary | ICD-10-CM | POA: Diagnosis not present

## 2020-05-27 DIAGNOSIS — I493 Ventricular premature depolarization: Secondary | ICD-10-CM | POA: Diagnosis not present

## 2020-05-27 DIAGNOSIS — E538 Deficiency of other specified B group vitamins: Secondary | ICD-10-CM | POA: Diagnosis not present

## 2020-05-27 DIAGNOSIS — R55 Syncope and collapse: Secondary | ICD-10-CM | POA: Diagnosis not present

## 2020-05-28 DIAGNOSIS — R42 Dizziness and giddiness: Secondary | ICD-10-CM

## 2020-05-29 ENCOUNTER — Other Ambulatory Visit: Payer: Self-pay | Admitting: Family Medicine

## 2020-05-29 DIAGNOSIS — R1013 Epigastric pain: Secondary | ICD-10-CM

## 2020-05-29 NOTE — Telephone Encounter (Signed)
Called patient this am at 8:30. C/o sweating, facial flushing - recently treated for sinusitis. Offered 24 hour ambulatory BP monitor to see if she is having hypotensive episodes. Please order this for her. Follow-up with me or APP afterwards.  Thanks.  Dr Lemmie Evens

## 2020-05-29 NOTE — Telephone Encounter (Signed)
24 hour BP monitor ordered per MD. Staff message sent to [facility] [facility] pool regarding order/follow up

## 2020-05-29 NOTE — Telephone Encounter (Signed)
Please order 24 hr bp monitor, follow-up with me or APP after  Dr Lemmie Evens

## 2020-06-05 ENCOUNTER — Other Ambulatory Visit: Payer: Medicare PPO

## 2020-06-09 ENCOUNTER — Other Ambulatory Visit: Payer: Self-pay | Admitting: Internal Medicine

## 2020-06-09 ENCOUNTER — Telehealth: Payer: Self-pay | Admitting: *Deleted

## 2020-06-09 DIAGNOSIS — R030 Elevated blood-pressure reading, without diagnosis of hypertension: Secondary | ICD-10-CM

## 2020-06-09 DIAGNOSIS — R42 Dizziness and giddiness: Secondary | ICD-10-CM

## 2020-06-09 NOTE — Telephone Encounter (Signed)
Patient scheduled for 24 hour ambulatory blood pressure monitor , Monday , 06/16/2020, at 9:00AM.

## 2020-06-16 ENCOUNTER — Other Ambulatory Visit: Payer: Self-pay

## 2020-06-16 ENCOUNTER — Ambulatory Visit (INDEPENDENT_AMBULATORY_CARE_PROVIDER_SITE_OTHER): Payer: Medicare PPO

## 2020-06-16 DIAGNOSIS — R030 Elevated blood-pressure reading, without diagnosis of hypertension: Secondary | ICD-10-CM | POA: Diagnosis not present

## 2020-06-16 DIAGNOSIS — R42 Dizziness and giddiness: Secondary | ICD-10-CM | POA: Diagnosis not present

## 2020-06-25 ENCOUNTER — Ambulatory Visit
Admission: RE | Admit: 2020-06-25 | Discharge: 2020-06-25 | Disposition: A | Discharge status: Home / Self Care | Payer: Medicare PPO | Source: Ambulatory Visit | Attending: Family Medicine | Admitting: Family Medicine

## 2020-06-25 DIAGNOSIS — K8020 Calculus of gallbladder without cholecystitis without obstruction: Secondary | ICD-10-CM | POA: Diagnosis not present

## 2020-06-25 DIAGNOSIS — R1013 Epigastric pain: Secondary | ICD-10-CM

## 2020-06-25 DIAGNOSIS — K7689 Other specified diseases of liver: Secondary | ICD-10-CM | POA: Diagnosis not present

## 2020-06-25 DIAGNOSIS — N200 Calculus of kidney: Secondary | ICD-10-CM | POA: Diagnosis not present

## 2020-06-25 DIAGNOSIS — K760 Fatty (change of) liver, not elsewhere classified: Secondary | ICD-10-CM | POA: Diagnosis not present

## 2020-06-28 ENCOUNTER — Other Ambulatory Visit: Payer: Self-pay | Admitting: Obstetrics and Gynecology

## 2020-06-28 DIAGNOSIS — E2839 Other primary ovarian failure: Secondary | ICD-10-CM

## 2020-07-14 ENCOUNTER — Encounter: Payer: Self-pay | Admitting: Internal Medicine

## 2020-07-14 ENCOUNTER — Ambulatory Visit: Payer: Medicare PPO | Admitting: Internal Medicine

## 2020-07-14 ENCOUNTER — Other Ambulatory Visit: Payer: Self-pay

## 2020-07-14 VITALS — BP 124/86 | HR 67 | Ht 62.0 in | Wt 149.0 lb

## 2020-07-14 DIAGNOSIS — K8020 Calculus of gallbladder without cholecystitis without obstruction: Secondary | ICD-10-CM

## 2020-07-14 DIAGNOSIS — K219 Gastro-esophageal reflux disease without esophagitis: Secondary | ICD-10-CM | POA: Diagnosis not present

## 2020-07-14 DIAGNOSIS — R112 Nausea with vomiting, unspecified: Secondary | ICD-10-CM

## 2020-07-14 DIAGNOSIS — R109 Unspecified abdominal pain: Secondary | ICD-10-CM | POA: Diagnosis not present

## 2020-07-14 DIAGNOSIS — K3184 Gastroparesis: Secondary | ICD-10-CM | POA: Diagnosis not present

## 2020-07-14 MED ORDER — ESOMEPRAZOLE MAGNESIUM 40 MG PO CPDR: 40.0000 mg | DELAYED_RELEASE_CAPSULE | Freq: Two times a day (BID) | ORAL | 6 refills | Status: DC

## 2020-07-14 MED ORDER — ONDANSETRON HCL 4 MG PO TABS: 4.0000 mg | ORAL_TABLET | ORAL | 1 refills | Status: DC | PRN

## 2020-07-14 NOTE — Progress Notes (Signed)
HISTORY OF PRESENT ILLNESS:  [name] is a 65 y.o. female with hypertension, hyperlipidemia, breast cancer, GERD requiring PPI, and IBS.  She was last evaluated in the office May 2019 regarding chronic recurrent right upper quadrant pain.  See that dictation.  She presents today with complaints of recurrent postprandial bloating with upper abdominal discomfort with intermittent nausea and vomiting.  As well pyrosis with these episodes and decreased appetite.  Chronic GERD she continues on Nexium 40 mg daily.  She describes having such episodes in September, October, and December 2021.  She did see her PCP who was abdominal ultrasound.  She was found to have a small gallstone.  She is also known to have delayed gastric emptying from the gastric emptying scan but years ago.  Her last hemoglobin A1c 6.0.  She has no regular medication for nausea.  She wonders if her gallstone is responsible for some of her GI complaints.  She did undergo colonoscopy and upper endoscopy in April 2014.  Colonoscopy revealed left-sided diverticulosis but was otherwise normal including the ileum.  Upper endoscopy was normal.  Review of outside blood work June 25 2020 months normal CBC with hemoglobin 14.3.  Unremarkable comprehensive metabolic panel including normal liver tests.  Abdominal ultrasound June 25, 2020 reveals bilateral nonobstructive nephrolithiasis, hepatic steatosis, and cholelithiasis as mentioned.  She has completed her COVID vaccination series  REVIEW OF SYSTEMS:  All non-GI ROS negative unless otherwise stated in HPI except for allergies, arthritis, heart murmur, heart rhythm change  Past Medical History:  Diagnosis Date  . ANXIETY 11/07/2009   Qualifier: Diagnosis of  By: Trellis Paganini PA-c, Amy S   . Breast CA ([place])    right - radiation & surgery  . Diabetes (HCC)    Type II, diet control  . DIVERTICULOSIS, COLON 06/26/2004   Qualifier: Diagnosis of  By: Arsenio Loader RN, Ramond Craver    . DM2 (diabetes  mellitus, type 2) ([place]) 11/07/2009   Qualifier: Diagnosis of  By: Trellis Paganini PA-c, Amy S   . Family history of heart disease   . GASTRIC POLYP 03/30/2007   Qualifier: Diagnosis of  By: Arsenio Loader RN, Ramond Craver    . Gastroparesis    mild  . GERD 11/07/2009   Qualifier: Diagnosis of  By: Trellis Paganini PA-c, Amy S   . History of nuclear stress test 12/2011   bruce protocol; no evidence of ischemia, EF 79%, normal pattern of perfusion  . Hyperchloremia   . Hyperlipidemia   . Hypertension   . HYPERTHYROIDISM 11/07/2009   Qualifier: Diagnosis of  By: Shane Crutch, Amy S   . Irritable bowel syndrome 08/04/2007   Qualifier: Diagnosis of  By: Arsenio Loader RN, Ramond Craver    . MVP (mitral valve prolapse)    last echo 07/1997 - normal LV size/function, mild MVP, mild MR  . OVARIAN CYST 08/04/2007   Qualifier: Diagnosis of  By: Arsenio Loader RN, Carissa    . Personal history of radiation therapy   . PVC (premature ventricular contraction)   . Vitamin D deficiency     Past Surgical History:  Procedure Laterality Date  . ABDOMINAL HYSTERECTOMY  03/2003  . APPENDECTOMY  01/2002  . BREAST LUMPECTOMY Right 2006   x2  . CARDIAC CATHETERIZATION  10/13/2009   no significant fixed obstructive CAD, normral ramus intermediate/circumflex/RCA; normal LV function (Dr. Corky Downs)  . [place]wa [place]  reports that she has never smoked. She has never used smokeless tobacco. She reports current  alcohol use. She reports that she does not use drugs.  family history includes Alzheimer's disease in her mother; Asthma in her child; Breast cancer in her maternal aunt and paternal grandmother; Diabetes in her brother and maternal grandmother; Emphysema in her maternal grandfather; Heart disease in her paternal grandfather; Heart disease (age of onset: 52) in her brother; Heart failure in her father and maternal grandmother; Hyperlipidemia in her sister; Hypertension in her brother and father.  Allergies   Allergen Reactions  . Cocoa Other (See Comments)    migraine  . Amoxicillin     REACTION: rash  . Chocolate   . Crestor [Rosuvastatin]     myalgias  . Erythromycin     REACTION: Nausea  . Morphine And Related Nausea And Vomiting  . Other Rash  . Sulfamethoxazole-Trimethoprim Nausea Only       PHYSICAL EXAMINATION: Vital signs: BP 124/86 (BP Location: Left Arm, Patient Position: Sitting)   Pulse 67   Ht 5\' 2"  (1.575 m)   Wt 149 lb (67.6 kg)   SpO2 98%   BMI 27.25 kg/m   Constitutional: generally well-appearing, no acute distress Psychiatric: alert and oriented x3, cooperative Eyes: extraocular movements intact, anicteric, conjunctiva pink Mouth: oral pharynx moist, no lesions Neck: supple no lymphadenopathy Cardiovascular: heart regular rate and rhythm. Lungs: clear to auscultation bilaterally Abdomen: soft, nontender, nondistended, no obvious ascites, no peritoneal signs, normal bowel sounds, no organomegaly Rectal: Omitted Extremities: no clubbing, cyanosis, or lower extremity edema bilaterally Skin: no lesions on visible extremities Neuro: No focal deficits.  Cranial nerves intact  ASSESSMENT:  1.  Intermittent problems with postprandial bloating upper abdominal discomfort, anorexia, and intermittent nausea and vomiting.  Rule out gastroparesis.  Rule out acid peptic disorders 2.  Worsening reflux intermittently despite once daily PPI 3.  Cholelithiasis on ultrasound 4.  Normal EGD 2014 5.  Colonic diverticulosis on colonoscopy 2014   PLAN:  1.  Increase Nexium to 40 mg twice daily 2.  Reflux precautions 3.  Schedule diagnostic upper endoscopy.The nature of the procedure, as well as the risks, benefits, and alternatives were carefully and thoroughly reviewed with the patient. Ample time for discussion and questions allowed. The patient understood, was satisfied, and agreed to proceed. 4.  Prescribe Zofran 4 mg every 4 hours as needed for nausea 5.  If upper  endoscopy unremarkable, repeat gastric emptying scan to assess for clinically significant gastroparesis 6.  If etiology of symptom complex is unclear despite all the above, consider referral for surgical opinion to consider possible laparoscopic cholecystectomy for possible symptomatic cholelithiasis. Total time 40 minutes was required preparing to see the patient, reviewing outside laboratories and radiology studies.  Reviewed prior endoscopy reports.  Obtaining comprehensive history.  Performing comprehensive physical exam.  Counseling the patient regarding her above listed issues.  Ordering medications, procedures, and likely advanced radiology studies.  Finally, documenting clinical information in the health record.

## 2020-07-14 NOTE — Patient Instructions (Signed)
We have sent the following medications to your pharmacy for you to pick up at your convenience:  Nexium - twice a day, Zofran.  You have been scheduled for an endoscopy. Please follow written instructions given to you at your visit today. If you use inhalers (even only as needed), please bring them with you on the day of your procedure.

## 2020-07-16 ENCOUNTER — Other Ambulatory Visit: Payer: Self-pay

## 2020-07-16 ENCOUNTER — Ambulatory Visit (AMBULATORY_SURGERY_CENTER): Payer: Medicare PPO | Admitting: Internal Medicine

## 2020-07-16 ENCOUNTER — Encounter: Payer: Self-pay | Admitting: Internal Medicine

## 2020-07-16 VITALS — BP 133/74 | HR 58 | Temp 98.4°F | Resp 12 | Ht 62.0 in | Wt 149.0 lb

## 2020-07-16 DIAGNOSIS — K8020 Calculus of gallbladder without cholecystitis without obstruction: Secondary | ICD-10-CM

## 2020-07-16 DIAGNOSIS — R109 Unspecified abdominal pain: Secondary | ICD-10-CM | POA: Diagnosis not present

## 2020-07-16 DIAGNOSIS — K219 Gastro-esophageal reflux disease without esophagitis: Secondary | ICD-10-CM

## 2020-07-16 DIAGNOSIS — K317 Polyp of stomach and duodenum: Secondary | ICD-10-CM | POA: Diagnosis not present

## 2020-07-16 MED ORDER — SODIUM CHLORIDE 0.9 % IV SOLN: 500.0000 mL | Freq: Once | INTRAVENOUS | Status: DC

## 2020-07-16 NOTE — Patient Instructions (Signed)
YOU HAD AN ENDOSCOPIC PROCEDURE TODAY AT THE [facility]:   Refer to the procedure report that was given to you for any specific questions about what was found during the examination.  If the procedure report does not answer your questions, please call your gastroenterologist to clarify.  If you requested that your care partner not be given the details of your procedure findings, then the procedure report has been included in a sealed envelope for you to review at your convenience later.  YOU SHOULD EXPECT: Some feelings of bloating in the abdomen. Passage of more gas than usual.  Walking can help get rid of the air that was put into your GI tract during the procedure and reduce the bloating. If you had a lower endoscopy (such as a colonoscopy or flexible sigmoidoscopy) you may notice spotting of blood in your stool or on the toilet paper. If you underwent a bowel prep for your procedure, you may not have a normal bowel movement for a few days.  Please Note:  You might notice some irritation and congestion in your nose or some drainage.  This is from the oxygen used during your procedure.  There is no need for concern and it should clear up in a day or so.  SYMPTOMS TO REPORT IMMEDIATELY:    Following upper endoscopy (EGD)  Vomiting of blood or coffee ground material  New chest pain or pain under the shoulder blades  Painful or persistently difficult swallowing  New shortness of breath  Fever of 100F or higher  Black, tarry-looking stools  For urgent or emergent issues, a gastroenterologist can be reached at any hour by calling [phone]. Do not use MyChart messaging for urgent concerns.    DIET:  We do recommend a small meal at first, but then you may proceed to your regular diet.  Drink plenty of fluids but you should avoid alcoholic beverages for 24 hours.  ACTIVITY:  You should plan to take it easy for the rest of today and you should NOT DRIVE or use heavy machinery  until tomorrow (because of the sedation medicines used during the test).    FOLLOW UP: Our staff will call the number listed on your records 48-72 hours following your procedure to check on you and address any questions or concerns that you may have regarding the information given to you following your procedure. If we do not reach you, we will leave a message.  We will attempt to reach you two times.  During this call, we will ask if you have developed any symptoms of COVID 19. If you develop any symptoms (ie: fever, flu-like symptoms, shortness of breath, cough etc.) before then, please call [phone].  If you test positive for Covid 19 in the 2 weeks post procedure, please call and report this information to us.    If any biopsies were taken you will be contacted by phone or by letter within the next 1-3 weeks.  Please call us at [phone] if you have not heard about the biopsies in 3 weeks.    SIGNATURES/CONFIDENTIALITY: You and/or your care partner have signed paperwork which will be entered into your electronic medical record.  These signatures attest to the fact that that the information above on your After Visit Summary has been reviewed and is understood.  Full responsibility of the confidentiality of this discharge information lies with you and/or your care-partner. 

## 2020-07-16 NOTE — Progress Notes (Signed)
Called to room to assist during endoscopic procedure.  Patient ID and intended procedure confirmed with present staff. Received instructions for my participation in the procedure from the performing physician.  

## 2020-07-16 NOTE — Progress Notes (Signed)
Pt Drowsy. VSS. To PACU, report to RN. No anesthetic complications noted.  

## 2020-07-16 NOTE — Progress Notes (Signed)
Lidocaine 100mg IV given to blunt gag reflex 

## 2020-07-16 NOTE — Progress Notes (Signed)
Vital by CW

## 2020-07-16 NOTE — Op Note (Signed)
[name] Patient Name: [name] Procedure Date: 07/16/2020 10:49 AM MRN: [id number] Endoscopist: Docia Chuck. Henrene Pastor , MD Age: 65 Referring MD:  Date of Birth: Jun 05, 1956 Gender: Female Account #: [id number] Procedure:                Upper GI endoscopy with biopsies Indications:              Epigastric abdominal pain, Esophageal reflux                            symptoms that persist despite appropriate therapy,                            Nausea with vomiting. Recent ultrasound                            demonstrating small gallstone. Seen in the office 2                            days ago. PPI increased to twice daily. Medicines:                Monitored Anesthesia Care Procedure:                Pre-Anesthesia Assessment:                           - Prior to the procedure, a History and Physical                            was performed, and patient medications and                            allergies were reviewed. The patient's tolerance of                            previous anesthesia was also reviewed. The risks                            and benefits of the procedure and the sedation                            options and risks were discussed with the patient.                            All questions were answered, and informed consent                            was obtained. Prior Anticoagulants: The patient has                            taken no previous anticoagulant or antiplatelet                            agents. ASA Grade Assessment: II - A patient with  mild systemic disease. After reviewing the risks                            and benefits, the patient was deemed in                            satisfactory condition to undergo the procedure.                           After obtaining informed consent, the endoscope was                            passed under direct vision. Throughout the                            procedure, the  patient's blood pressure, pulse, and                            oxygen saturations were monitored continuously. The                            Endoscope was introduced through the mouth, and                            advanced to the second part of duodenum. The upper                            GI endoscopy was accomplished without difficulty.                            The patient tolerated the procedure well. Scope In: Scope Out: Findings:                 The esophagus was normal.                           Multiple 1 to 15 mm pedunculated polyps with no                            bleeding were found in the gastric body. These were                            consistent with benign fundic gland type polyps.                            Biopsies were taken with a cold forceps for                            histology from 4 or 5 of the largest representative                            polyps.                           The  stomach was otherwise normal.                           The examined duodenum was normal.                           The cardia and gastric fundus were normal on                            retroflexion. Complications:            No immediate complications. Estimated Blood Loss:     Estimated blood loss: none. Impression:               - Normal esophagus.                           - Multiple gastric polyps. Biopsied.                           - Normal stomach otherwise.                           - Normal examined duodenum. Recommendation:           1. Continue Nexium 40 mg twice daily                           2. Reflux precautions                           3. Follow-up biopsies                           4. Schedule solid-phase gastric emptying scan "rule                            out gastroparesis"                           5. Office follow-up with Dr. Henrene Pastor in 4 to 6 weeks.                            Contact the office in the interim for any questions                             or problems. Docia Chuck. Henrene Pastor, MD 07/16/2020 11:04:23 AM This report has been signed electronically.

## 2020-07-17 ENCOUNTER — Other Ambulatory Visit: Payer: Self-pay

## 2020-07-17 ENCOUNTER — Telehealth: Payer: Self-pay

## 2020-07-17 DIAGNOSIS — R112 Nausea with vomiting, unspecified: Secondary | ICD-10-CM

## 2020-07-17 DIAGNOSIS — R109 Unspecified abdominal pain: Secondary | ICD-10-CM

## 2020-07-17 NOTE — Telephone Encounter (Signed)
Pt scheduled for GES at [facility] 08/11/20@7 :30am, pt to arrive there at 7:15am, be NPO after midnight and hold stomach meds after midnight. Pt scheduled to see Dr. Henrene Pastor 09/02/20@9 :40am. Pt aware.

## 2020-07-18 ENCOUNTER — Telehealth: Payer: Self-pay | Admitting: *Deleted

## 2020-07-18 NOTE — Telephone Encounter (Signed)
  Follow up Call-  Call back number 07/16/2020  Post procedure Call Back phone  # [phone]  Permission to leave phone message Yes  Some recent data might be hidden     Patient questions:  Do you have a fever, pain , or abdominal swelling? No. Pain Score  0 *  Have you tolerated food without any problems? Yes.    Have you been able to return to your normal activities? Yes.    Do you have any questions about your discharge instructions: Diet   No. Medications  No. Follow up visit  No.  Do you have questions or concerns about your Care? No.  Actions: * If pain score is 4 or above: No action needed, pain <4.  1. Have you developed a fever since your procedure? no  2.   Have you had an respiratory symptoms (SOB or cough) since your procedure? no  3.   Have you tested positive for COVID 19 since your procedure no  4.   Have you had any family members/close contacts diagnosed with the COVID 19 since your procedure?  no   If yes to any of these questions please route to Joylene John, RN and Joella Prince, RN

## 2020-07-24 ENCOUNTER — Encounter: Payer: Self-pay | Admitting: Internal Medicine

## 2020-07-25 DIAGNOSIS — Z7984 Long term (current) use of oral hypoglycemic drugs: Secondary | ICD-10-CM | POA: Diagnosis not present

## 2020-07-25 DIAGNOSIS — Z853 Personal history of malignant neoplasm of breast: Secondary | ICD-10-CM | POA: Diagnosis not present

## 2020-07-25 DIAGNOSIS — I493 Ventricular premature depolarization: Secondary | ICD-10-CM | POA: Diagnosis not present

## 2020-07-25 DIAGNOSIS — R1013 Epigastric pain: Secondary | ICD-10-CM | POA: Diagnosis not present

## 2020-07-25 DIAGNOSIS — R55 Syncope and collapse: Secondary | ICD-10-CM | POA: Diagnosis not present

## 2020-07-25 DIAGNOSIS — E538 Deficiency of other specified B group vitamins: Secondary | ICD-10-CM | POA: Diagnosis not present

## 2020-07-25 DIAGNOSIS — E1169 Type 2 diabetes mellitus with other specified complication: Secondary | ICD-10-CM | POA: Diagnosis not present

## 2020-07-30 DIAGNOSIS — Z6827 Body mass index (BMI) 27.0-27.9, adult: Secondary | ICD-10-CM | POA: Diagnosis not present

## 2020-07-30 DIAGNOSIS — I700 Atherosclerosis of aorta: Secondary | ICD-10-CM | POA: Diagnosis not present

## 2020-07-30 DIAGNOSIS — K2100 Gastro-esophageal reflux disease with esophagitis, without bleeding: Secondary | ICD-10-CM | POA: Diagnosis not present

## 2020-07-30 DIAGNOSIS — E663 Overweight: Secondary | ICD-10-CM | POA: Diagnosis not present

## 2020-07-30 DIAGNOSIS — E1169 Type 2 diabetes mellitus with other specified complication: Secondary | ICD-10-CM | POA: Diagnosis not present

## 2020-07-30 DIAGNOSIS — Z23 Encounter for immunization: Secondary | ICD-10-CM | POA: Diagnosis not present

## 2020-07-30 DIAGNOSIS — M25562 Pain in left knee: Secondary | ICD-10-CM | POA: Diagnosis not present

## 2020-07-31 DIAGNOSIS — M1712 Unilateral primary osteoarthritis, left knee: Secondary | ICD-10-CM | POA: Diagnosis not present

## 2020-07-31 DIAGNOSIS — M25562 Pain in left knee: Secondary | ICD-10-CM | POA: Diagnosis not present

## 2020-08-11 ENCOUNTER — Ambulatory Visit (HOSPITAL_COMMUNITY)
Admission: RE | Admit: 2020-08-11 | Discharge: 2020-08-11 | Disposition: A | Discharge status: Home / Self Care | Payer: Medicare PPO | Source: Ambulatory Visit | Attending: Internal Medicine | Admitting: Internal Medicine

## 2020-08-11 ENCOUNTER — Other Ambulatory Visit: Payer: Self-pay

## 2020-08-11 DIAGNOSIS — R109 Unspecified abdominal pain: Secondary | ICD-10-CM | POA: Insufficient documentation

## 2020-08-11 DIAGNOSIS — R140 Abdominal distension (gaseous): Secondary | ICD-10-CM | POA: Diagnosis not present

## 2020-08-11 DIAGNOSIS — R112 Nausea with vomiting, unspecified: Secondary | ICD-10-CM | POA: Insufficient documentation

## 2020-08-11 MED ORDER — TECHNETIUM TC 99M SULFUR COLLOID
2.1000 | Freq: Once | INTRAVENOUS | Status: AC | PRN
  Administered 2020-08-11: 2.1 via INTRAVENOUS

## 2020-08-13 ENCOUNTER — Other Ambulatory Visit: Payer: Self-pay | Admitting: Internal Medicine

## 2020-08-24 ENCOUNTER — Other Ambulatory Visit: Payer: Self-pay | Admitting: Internal Medicine

## 2020-09-02 ENCOUNTER — Encounter: Payer: Self-pay | Admitting: Internal Medicine

## 2020-09-02 ENCOUNTER — Ambulatory Visit: Payer: Medicare PPO | Admitting: Internal Medicine

## 2020-09-02 VITALS — BP 140/84 | HR 76 | Ht 62.0 in | Wt 147.1 lb

## 2020-09-02 DIAGNOSIS — K219 Gastro-esophageal reflux disease without esophagitis: Secondary | ICD-10-CM | POA: Diagnosis not present

## 2020-09-02 DIAGNOSIS — R109 Unspecified abdominal pain: Secondary | ICD-10-CM | POA: Diagnosis not present

## 2020-09-02 DIAGNOSIS — R112 Nausea with vomiting, unspecified: Secondary | ICD-10-CM | POA: Diagnosis not present

## 2020-09-02 DIAGNOSIS — K8020 Calculus of gallbladder without cholecystitis without obstruction: Secondary | ICD-10-CM

## 2020-09-02 NOTE — Patient Instructions (Signed)
I will refer you to [facility] should hear from them to schedule an appointment

## 2020-09-02 NOTE — Progress Notes (Signed)
HISTORY OF PRESENT ILLNESS:  [name] is a 65 y.o. female who was evaluated July 14, 2020 regarding intermittent problems with postprandial bloating and upper abdominal discomfort, anorexia, and intermittent nausea and vomiting.  She underwent upper endoscopy July 16, 2020.  This was essentially normal.  Her PPI was increased from Nexium 40 mg daily to Nexium 40 mg twice daily.  She subsequently underwent a solid-phase gastric emptying scan.  This was normal.  Abdominal ultrasound January 2022 revealed gallstones.  She presents today for follow-up.  She has continued to have some intermittent problems with nausea and abdominal discomfort.  No severe episodes since December.  She is concerned about traveling and having another episode of severe pain with nausea and vomiting.  She has used Zofran rarely, which helps.  REVIEW OF SYSTEMS:  All non-GI ROS negative unless otherwise stated in the HPI except for back pain, arthritis, sinus allergy  Past Medical History:  Diagnosis Date  . ANXIETY 11/07/2009   Qualifier: Diagnosis of  By: Trellis Paganini PA-c, Amy S   . Breast CA ([place])    right - radiation & surgery  . Diabetes (HCC)    Type II, diet control  . DIVERTICULOSIS, COLON 06/26/2004   Qualifier: Diagnosis of  By: Arsenio Loader RN, Ramond Craver    . DM2 (diabetes mellitus, type 2) ([place]) 11/07/2009   Qualifier: Diagnosis of  By: Trellis Paganini PA-c, Amy S   . Family history of heart disease   . GASTRIC POLYP 03/30/2007   Qualifier: Diagnosis of  By: Arsenio Loader RN, Ramond Craver    . Gastroparesis    mild  . GERD 11/07/2009   Qualifier: Diagnosis of  By: Trellis Paganini PA-c, Amy S   . History of nuclear stress test 12/2011   bruce protocol; no evidence of ischemia, EF 79%, normal pattern of perfusion  . Hyperchloremia   . Hyperlipidemia   . HYPERTHYROIDISM 11/07/2009   Qualifier: Diagnosis of  By: Shane Crutch, Amy S   . Irritable bowel syndrome 08/04/2007   Qualifier: Diagnosis of  By: Arsenio Loader RN, Ramond Craver     . MVP (mitral valve prolapse)    last echo 07/1997 - normal LV size/function, mild MVP, mild MR  . OVARIAN CYST 08/04/2007   Qualifier: Diagnosis of  By: Arsenio Loader RN, Carissa    . Personal history of radiation therapy   . PVC (premature ventricular contraction)   . Vitamin D deficiency     Past Surgical History:  Procedure Laterality Date  . ABDOMINAL HYSTERECTOMY  03/2003  . APPENDECTOMY  01/2002  . BREAST LUMPECTOMY Right 2006   x2  . CARDIAC CATHETERIZATION  10/13/2009   no significant fixed obstructive CAD, normral ramus intermediate/circumflex/RCA; normal LV function (Dr. Corky Downs)  . LAPAROSCOPIC OOPHERECTOMY    . [place]  reports that she has never smoked. She has never used smokeless tobacco. She reports current alcohol use. She reports that she does not use drugs.  family history includes Alzheimer's disease in her mother; Asthma in her child; Breast cancer in her maternal aunt and paternal grandmother; Diabetes in her brother and maternal grandmother; Emphysema in her maternal grandfather; Heart disease in her paternal grandfather; Heart disease (age of onset: 68) in her brother; Heart failure in her father and maternal grandmother; Hyperlipidemia in her sister; Hypertension in her brother and father.  Allergies  Allergen Reactions  . Cocoa Other (See Comments)    migraine  . Amoxicillin     REACTION:  rash  . Chocolate   . Crestor [Rosuvastatin]     myalgias  . Erythromycin     REACTION: Nausea  . Morphine And Related Nausea And Vomiting  . Other Rash    Tape, bandaids  . Sulfamethoxazole-Trimethoprim Nausea Only       PHYSICAL EXAMINATION: Vital signs: BP 140/84 (BP Location: Left Arm, Patient Position: Sitting, Cuff Size: Normal)   Pulse 76   Ht 5\' 2"  (1.575 m)   Wt 147 lb 2 oz (66.7 kg)   BMI 26.91 kg/m   Constitutional: generally well-appearing, no acute distress Psychiatric: alert and oriented x3,  cooperative Eyes: extraocular movements intact, anicteric, conjunctiva pink Mouth: oral pharynx moist, no lesions Neck: supple no lymphadenopathy Cardiovascular: heart regular rate and rhythm Lungs: clear to auscultation bilaterally Abdomen: soft, nontender, nondistended, no obvious ascites, no peritoneal signs, normal bowel sounds, no organomegaly Rectal: Omitted Extremities: no clubbing or cyanosis.  Trace lower extremity edema bilaterally Skin: no lesions on visible extremities Neuro: No focal deficits.  Cranial nerves intact  ASSESSMENT:  1.  Episodic upper abdominal pain with nausea and vomiting.  Likely secondary to gallstones 2.  GERD.  Symptoms controlled with twice daily PPI 3.  Colonoscopy 2014 with diverticulosis.  No neoplasia   PLAN:  1.  General surgical referral to Dr. Harlow Asa or Ninfa Linden for a surgical opinion regarding laparoscopic cholecystectomy 2.  Reflux precautions 3.  Continue PPI 4.  Screening colonoscopy 2024 5.  Interval GI follow-up as needed.

## 2020-09-04 DIAGNOSIS — M170 Bilateral primary osteoarthritis of knee: Secondary | ICD-10-CM | POA: Diagnosis not present

## 2020-09-04 DIAGNOSIS — M25562 Pain in left knee: Secondary | ICD-10-CM | POA: Diagnosis not present

## 2020-09-04 DIAGNOSIS — M1712 Unilateral primary osteoarthritis, left knee: Secondary | ICD-10-CM | POA: Diagnosis not present

## 2020-09-22 ENCOUNTER — Telehealth: Payer: Self-pay | Admitting: Internal Medicine

## 2020-09-22 DIAGNOSIS — E785 Hyperlipidemia, unspecified: Secondary | ICD-10-CM

## 2020-09-22 NOTE — Telephone Encounter (Signed)
NMR lipoprofile ordered to be done before May 2022 visit with MD MyChart reminder sent

## 2020-10-30 DIAGNOSIS — M1712 Unilateral primary osteoarthritis, left knee: Secondary | ICD-10-CM | POA: Diagnosis not present

## 2020-11-06 ENCOUNTER — Other Ambulatory Visit: Payer: Self-pay | Admitting: Internal Medicine

## 2020-11-06 DIAGNOSIS — M1712 Unilateral primary osteoarthritis, left knee: Secondary | ICD-10-CM | POA: Diagnosis not present

## 2020-11-09 ENCOUNTER — Encounter (HOSPITAL_COMMUNITY): Payer: Self-pay

## 2020-11-09 ENCOUNTER — Emergency Department (HOSPITAL_COMMUNITY)
Admission: EM | Admit: 2020-11-09 | Discharge: 2020-11-09 | Disposition: A | Discharge status: Home / Self Care | Payer: Medicare PPO | Attending: Emergency Medicine | Admitting: Emergency Medicine

## 2020-11-09 ENCOUNTER — Other Ambulatory Visit: Payer: Self-pay

## 2020-11-09 ENCOUNTER — Emergency Department (HOSPITAL_COMMUNITY): Payer: Medicare PPO

## 2020-11-09 DIAGNOSIS — Z7982 Long term (current) use of aspirin: Secondary | ICD-10-CM | POA: Diagnosis not present

## 2020-11-09 DIAGNOSIS — R1013 Epigastric pain: Secondary | ICD-10-CM | POA: Diagnosis not present

## 2020-11-09 DIAGNOSIS — Z7984 Long term (current) use of oral hypoglycemic drugs: Secondary | ICD-10-CM | POA: Diagnosis not present

## 2020-11-09 DIAGNOSIS — Z853 Personal history of malignant neoplasm of breast: Secondary | ICD-10-CM | POA: Insufficient documentation

## 2020-11-09 DIAGNOSIS — E119 Type 2 diabetes mellitus without complications: Secondary | ICD-10-CM | POA: Insufficient documentation

## 2020-11-09 DIAGNOSIS — R1011 Right upper quadrant pain: Secondary | ICD-10-CM | POA: Insufficient documentation

## 2020-11-09 DIAGNOSIS — I25119 Atherosclerotic heart disease of native coronary artery with unspecified angina pectoris: Secondary | ICD-10-CM | POA: Insufficient documentation

## 2020-11-09 HISTORY — DX: Calculus of gallbladder without cholecystitis without obstruction: K80.20

## 2020-11-09 LAB — CBC
HCT: 43.1 % (ref 36.0–46.0)
Hemoglobin: 14.8 g/dL (ref 12.0–15.0)
MCH: 32.2 pg (ref 26.0–34.0)
MCHC: 34.3 g/dL (ref 30.0–36.0)
MCV: 93.9 fL (ref 80.0–100.0)
Platelets: 328 10*3/uL (ref 150–400)
RBC: 4.59 MIL/uL (ref 3.87–5.11)
RDW: 13 % (ref 11.5–15.5)
WBC: 9.7 10*3/uL (ref 4.0–10.5)
nRBC: 0 % (ref 0.0–0.2)

## 2020-11-09 LAB — COMPREHENSIVE METABOLIC PANEL
ALT: 17 U/L (ref 0–44)
AST: 20 U/L (ref 15–41)
Albumin: 4.5 g/dL (ref 3.5–5.0)
Alkaline Phosphatase: 48 U/L (ref 38–126)
Anion gap: 11 (ref 5–15)
BUN: 17 mg/dL (ref 8–23)
CO2: 26 mmol/L (ref 22–32)
Calcium: 10.1 mg/dL (ref 8.9–10.3)
Chloride: 99 mmol/L (ref 98–111)
Creatinine, Ser: 0.83 mg/dL (ref 0.44–1.00)
GFR, Estimated: >60 mL/min (ref >60)
Glucose, Bld: 136 mg/dL — ABNORMAL HIGH (ref 70–99)
Potassium: 3.2 mmol/L — ABNORMAL LOW (ref 3.5–5.1)
Sodium: 136 mmol/L (ref 135–145)
Total Bilirubin: 0.5 mg/dL (ref 0.3–1.2)
Total Protein: 7.3 g/dL (ref 6.5–8.1)

## 2020-11-09 LAB — URINALYSIS, ROUTINE W REFLEX MICROSCOPIC
Bilirubin Urine: NEGATIVE
Glucose, UA: NEGATIVE mg/dL
Hgb urine dipstick: NEGATIVE
Ketones, ur: NEGATIVE mg/dL
Nitrite: NEGATIVE
Protein, ur: NEGATIVE mg/dL
Specific Gravity, Urine: 1.01 (ref 1.005–1.030)
pH: 5 (ref 5.0–8.0)

## 2020-11-09 LAB — LIPASE, BLOOD: Lipase: 23 U/L (ref 11–51)

## 2020-11-09 MED ORDER — HYDROCODONE-ACETAMINOPHEN 5-325 MG PO TABS: 1.0000 | ORAL_TABLET | Freq: Four times a day (QID) | ORAL | 0 refills | Status: DC | PRN

## 2020-11-09 MED ORDER — ONDANSETRON 4 MG PO TBDP: ORAL_TABLET | ORAL | 0 refills | Status: DC

## 2020-11-09 MED ORDER — SODIUM CHLORIDE 0.9 % IV BOLUS
500.0000 mL | Freq: Once | INTRAVENOUS | Status: AC
  Administered 2020-11-09: 500 mL via INTRAVENOUS

## 2020-11-09 MED ORDER — FENTANYL CITRATE (PF) 100 MCG/2ML IJ SOLN
50.0000 ug | Freq: Once | INTRAMUSCULAR | Status: AC
Start: 2020-11-09 — End: 2020-11-09
  Administered 2020-11-09: 50 ug via INTRAVENOUS
  Filled 2020-11-09: qty 2

## 2020-11-09 MED ORDER — ONDANSETRON HCL 4 MG/2ML IJ SOLN
4.0000 mg | Freq: Once | INTRAMUSCULAR | Status: AC
  Administered 2020-11-09: 4 mg via INTRAVENOUS
  Filled 2020-11-09: qty 2

## 2020-11-09 NOTE — ED Triage Notes (Signed)
Patient c/o RUQ pain and nausea since yesterday. Patient reports known gallbladder issues and a gallstone.

## 2020-11-09 NOTE — ED Provider Notes (Signed)
[place] DEPT Provider Note   CSN: [id number] Arrival date & time: 11/09/20  1120     History Chief Complaint  Patient presents with  . Abdominal Pain    [name] is a 65 y.o. female.  Patient is a 65 year old female who presents with abdominal pain.  She states that she has had issues with her gallbladder for a while.  She is followed by GI who referred her to [facility] surgery in March although she states she has not heard back from them yet about an appointment.  She reports that normally her pain goes away but today it has not.  It started yesterday about 3 PM and has been constant since then.  She has had some nausea but no vomiting.  No known fevers.  It started after she ate lunch which was some chicken strips, and new potatoes and okra.  She has not take anything for the pain.        Past Medical History:  Diagnosis Date  . ANXIETY 11/07/2009   Qualifier: Diagnosis of  By: Trellis Paganini PA-c, Amy S   . Breast CA ([place])    right - radiation & surgery  . Diabetes (HCC)    Type II, diet control  . DIVERTICULOSIS, COLON 06/26/2004   Qualifier: Diagnosis of  By: Arsenio Loader RN, Ramond Craver    . DM2 (diabetes mellitus, type 2) ([place]) 11/07/2009   Qualifier: Diagnosis of  By: Trellis Paganini PA-c, Amy S   . Family history of heart disease   . Gallstone   . GASTRIC POLYP 03/30/2007   Qualifier: Diagnosis of  By: Arsenio Loader RN, Ramond Craver    . Gastroparesis    mild  . GERD 11/07/2009   Qualifier: Diagnosis of  By: Trellis Paganini PA-c, Amy S   . History of nuclear stress test 12/2011   bruce protocol; no evidence of ischemia, EF 79%, normal pattern of perfusion  . Hyperchloremia   . Hyperlipidemia   . HYPERTHYROIDISM 11/07/2009   Qualifier: Diagnosis of  By: Shane Crutch, Amy S   . Irritable bowel syndrome 08/04/2007   Qualifier: Diagnosis of  By: Arsenio Loader RN, Ramond Craver    . MVP (mitral valve prolapse)    last echo 07/1997 - normal LV size/function, mild MVP,  mild MR  . OVARIAN CYST 08/04/2007   Qualifier: Diagnosis of  By: Arsenio Loader RN, Carissa    . Personal history of radiation therapy   . PVC (premature ventricular contraction)   . Vitamin D deficiency     Patient Active Problem List   Diagnosis Date Noted  . Chest pressure 01/12/2018  . Coronary artery disease involving native coronary artery of native heart with angina pectoris ([place]) 01/12/2018  . Abnormal ECG during exercise stress test 10/26/2017  . Shortness of breath 10/26/2017  . Encounter for monitoring flecainide therapy 09/23/2014  . Atrial fibrillation, unspecified 09/23/2014  . Sinusitis 03/18/2014  . Fatigue 03/25/2013  . PVC's (premature ventricular contractions) 03/19/2013  . HYPERTHYROIDISM 11/07/2009  . DM2 (diabetes mellitus, type 2) ([place]) 11/07/2009  . ANXIETY 11/07/2009  . GERD 11/07/2009  . EARLY SATIETY 11/07/2009  . WEIGHT LOSS-ABNORMAL 11/07/2009  . HEARTBURN 11/07/2009  . ABDOMINAL PAIN-EPIGASTRIC 11/07/2009  . PERSONAL HX BREAST CANCER 11/07/2009  . ADENOCARCINOMA, BREAST 08/04/2007  . Hyperlipidemia 08/04/2007  . IRRITABLE BOWEL SYNDROME 08/04/2007  . OVARIAN CYST 08/04/2007  . ABDOMINAL BLOATING 08/04/2007  . GASTRIC POLYP 03/30/2007  . GASTROPARESIS 03/28/2007  . DIVERTICULOSIS, COLON 06/26/2004    Past Surgical History:  Procedure Laterality Date  . ABDOMINAL HYSTERECTOMY  03/2003  . APPENDECTOMY  01/2002  . BREAST LUMPECTOMY Right 2006   x2  . CARDIAC CATHETERIZATION  10/13/2009   no significant fixed obstructive CAD, normral ramus intermediate/circumflex/RCA; normal LV function (Dr. Corky Downs)  . LAPAROSCOPIC OOPHERECTOMY    . TONSILLECTOMY  1969     OB History   No obstetric history on file.     Family History  Problem Relation Age of Onset  . Diabetes Maternal Grandmother   . Heart failure Maternal Grandmother   . Emphysema Maternal Grandfather   . Breast cancer Paternal Grandmother   . Heart disease Paternal Grandfather   .  Alzheimer's disease Mother   . Heart failure Father   . Hypertension Father        also murmur, hyperlipidemia, MI, CABG age 54  . Diabetes Brother   . Heart disease Brother 54       MI at 34  . Hypertension Brother   . Hyperlipidemia Sister   . Breast cancer Maternal Aunt        PVCs  . Asthma Child        x2  . Colon cancer Neg Hx   . Colon polyps Neg Hx   . Stomach cancer Neg Hx   . Esophageal cancer Neg Hx     Social History   Tobacco Use  . Smoking status: Never Smoker  . Smokeless tobacco: Never Used  Vaping Use  . Vaping Use: Never used  Substance Use Topics  . Alcohol use: Yes    Comment: occasionally  . Drug use: No    Home Medications Prior to Admission medications   Medication Sig Start Date End Date Taking? Authorizing Provider  HYDROcodone-acetaminophen (NORCO/VICODIN) 5-325 MG tablet Take 1-2 tablets by mouth every 6 (six) hours as needed. 11/09/20  Yes Malvin Johns, MD  ondansetron (ZOFRAN ODT) 4 MG disintegrating tablet 4mg  ODT q4 hours prn nausea/vomit 11/09/20  Yes Malvin Johns, MD  aspirin EC 81 MG tablet Take 81 mg by mouth daily.    [provider]  Cholecalciferol (VITAMIN D3 GUMMIES PO) Take by mouth. 2 in the morning    [provider]  cyanocobalamin 1000 MCG tablet Take 1,000 mcg by mouth daily. Takes 1250 mcg daily    [provider]  esomeprazole (NEXIUM) 40 MG capsule Take 1 capsule (40 mg total) by mouth in the morning and at bedtime. 07/14/20   Irene Shipper, MD  ezetimibe (ZETIA) 10 MG tablet TAKE 1 TABLET ONCE DAILY. 08/25/20   Hilty, Nadean Corwin, MD  flecainide (TAMBOCOR) 100 MG tablet TAKE 1 TABLET BY MOUTH TWICE DAILY. 08/13/20   Hilty, Nadean Corwin, MD  fluticasone (FLONASE) 50 MCG/ACT nasal spray Place 1 spray into both nostrils daily.    [provider]  Glucosamine-Chondroitin (COSAMIN DS PO) Take 1 tablet by mouth daily.    [provider]  Glucosamine-Chondroitin-Vit C 2000-1200-60 MG/30ML  LIQD Take by mouth daily in the afternoon.    [provider]  hydrochlorothiazide (HYDRODIURIL) 25 MG tablet Take 1 tablet (25 mg total) by mouth daily. 04/02/20   Hilty, Nadean Corwin, MD  hyoscyamine (LEVSIN SL) 0.125 MG SL tablet PLACE 1-2 TABLETS UNDER TONGUE EVERY FOUR HOURS AS NEEDED FOR ABDOMINAL PAIN. 10/14/17   Irene Shipper, MD  metFORMIN (GLUCOPHAGE) 500 MG tablet Take 2 tablets by mouth daily. 08/10/19   [provider]  Multiple Vitamins-Minerals (WOMENS 50+ [place] VITAMIN/MIN PO) Take by mouth.  2 in the AM    [provider]  nitroGLYCERIN (NITROSTAT) 0.4 MG SL tablet Place 1 tablet (0.4 mg total) under the tongue every 5 (five) minutes as needed for chest pain. MAX 3 doses 01/12/18 11/21/19  Hilty, Nadean Corwin, MD  ondansetron (ZOFRAN) 4 MG tablet Take 1 tablet (4 mg total) by mouth every 4 (four) hours as needed for nausea or vomiting. 07/14/20   Irene Shipper, MD  Probiotic TBEC See admin instructions.    [provider]  simvastatin (ZOCOR) 20 MG tablet TAKE 1 TABLET AT 6PM. 03/12/20   Hilty, Nadean Corwin, MD  VASCEPA 1 g capsule TAKE TWO CAPSULES BY MOUTH TWICE DAILY 11/06/20   Hilty, Nadean Corwin, MD    Allergies    Cocoa, Amoxicillin, Chocolate, Crestor [rosuvastatin], Erythromycin, Morphine and related, Other, and Sulfamethoxazole-trimethoprim  Review of Systems   Review of Systems  Constitutional: Negative for chills, diaphoresis, fatigue and fever.  HENT: Negative for congestion, rhinorrhea and sneezing.   Eyes: Negative.   Respiratory: Negative for cough, chest tightness and shortness of breath.   Cardiovascular: Negative for chest pain and leg swelling.  Gastrointestinal: Positive for abdominal pain and nausea. Negative for blood in stool, diarrhea and vomiting.  Genitourinary: Negative for difficulty urinating, flank pain, frequency and hematuria.  Musculoskeletal: Negative for arthralgias and back pain.  Skin: Negative for rash.  Neurological:  Negative for dizziness, speech difficulty, weakness, numbness and headaches.    Physical Exam Updated Vital Signs BP (!) 151/89   Pulse 63   Temp 98.2 F (36.8 C) (Oral)   Resp 18   Ht 5\' 2"  (1.575 m)   Wt 64 kg   SpO2 96%   BMI 25.79 kg/m   Physical Exam Constitutional:      Appearance: She is well-developed.  HENT:     Head: Normocephalic and atraumatic.  Eyes:     Pupils: Pupils are equal, round, and reactive to light.  Cardiovascular:     Rate and Rhythm: Normal rate and regular rhythm.     Heart sounds: Normal heart sounds.  Pulmonary:     Effort: Pulmonary effort is normal. No respiratory distress.     Breath sounds: Normal breath sounds. No wheezing or rales.  Chest:     Chest wall: No tenderness.  Abdominal:     General: Bowel sounds are normal.     Palpations: Abdomen is soft.     Tenderness: There is abdominal tenderness in the right upper quadrant. There is no guarding or rebound.  Musculoskeletal:        General: Normal range of motion.     Cervical back: Normal range of motion and neck supple.  Lymphadenopathy:     Cervical: No cervical adenopathy.  Skin:    General: Skin is warm and dry.     Findings: No rash.  Neurological:     Mental Status: She is alert and oriented to person, place, and time.     ED Results / Procedures / Treatments   Labs (all labs ordered are listed, but only abnormal results are displayed) Labs Reviewed  COMPREHENSIVE METABOLIC PANEL - Abnormal; Notable for the following components:      Result Value   Potassium 3.2 (*)    Glucose, Bld 136 (*)    All other components within normal limits  URINALYSIS, ROUTINE W REFLEX MICROSCOPIC - Abnormal; Notable for the following components:   Leukocytes,Ua SMALL (*)    Bacteria, UA RARE (*)  All other components within normal limits  LIPASE, BLOOD  CBC    EKG None  Radiology US Abdomen Limited RUQ (LIVER/GB)  Result Date: 11/09/2020 CLINICAL DATA:  Epigastric pain EXAM:  ULTRASOUND ABDOMEN LIMITED RIGHT UPPER QUADRANT COMPARISON:  None. FINDINGS: Gallbladder: No gallstones or wall thickening visualized. No sonographic Murphy sign noted by sonographer. Common bile duct: Diameter: 4 mm Liver: Normal in size. Mild increased parenchymal echogenicity. No masses. Portal vein is patent on color Doppler imaging with normal direction of blood flow towards the liver. Other: None. IMPRESSION: 1. No acute findings.  Normal gallbladder.  No bile duct dilation. 2. Mild increased liver parenchymal echogenicity suggests hepatic steatosis. Electronically Signed   By: Lajean Manes M.D.   On: 11/09/2020 12:53    Procedures Procedures   Medications Ordered in ED Medications  fentaNYL (SUBLIMAZE) injection 50 mcg (50 mcg Intravenous Given 11/09/20 1231)  ondansetron (ZOFRAN) injection 4 mg (4 mg Intravenous Given 11/09/20 1231)  sodium chloride 0.9 % bolus 500 mL (500 mLs Intravenous New Bag/Given 11/09/20 1228)    ED Course  I have reviewed the triage vital signs and the nursing notes.  Pertinent labs & imaging results that were available during my care of the patient were reviewed by me and considered in my medical decision making (see chart for details).    MDM Rules/Calculators/A&P                          Patient is a 66 year old female who presents with right upper quadrant abdominal pain.  She says it feels similar to her prior gallbladder pain.  She has a known gallstone.  Her labs are nonconcerning.  She does not have any signs of common bile duct obstruction.  She had an ultrasound of her gallbladder which shows no gallstones or gallbladder wall thickening.  Her pain has improved after dose of Zofran.  She only has pain in her right upper quadrant and epigastrium.  She does not have other areas of pain that would warrant CT imaging.  I encouraged her to continue her Nexium and Tums.  She was given a prescription for Vicodin and Zofran.  I also advised her to maintain a  strict fat-free diet.  She was given a referral to follow-up with [facility] surgery.  She can also follow-up with her GI doctor.  Return precautions were given.  Final Clinical Impression(s) / ED Diagnoses Final diagnoses:  RUQ pain  Right upper quadrant abdominal pain    Rx / DC Orders ED Discharge Orders         Ordered    HYDROcodone-acetaminophen (NORCO/VICODIN) 5-325 MG tablet  Every 6 hours PRN        11/09/20 1322    ondansetron (ZOFRAN ODT) 4 MG disintegrating tablet        11/09/20 1322           Malvin Johns, MD 11/09/20 1324

## 2020-11-09 NOTE — Discharge Instructions (Signed)
Follow-up with your gastroenterologist or [place] [facility] surgery as discussed.  Return to emergency room if you have any worsening pain, vomiting, fevers or other worsening symptoms.

## 2020-11-09 NOTE — ED Notes (Signed)
Patient's husband, Ronalee Belts at bedside.

## 2020-11-11 ENCOUNTER — Telehealth: Payer: Self-pay | Admitting: Internal Medicine

## 2020-11-11 NOTE — Telephone Encounter (Signed)
Called patient and she states she was in the ED over the weekend with RUQ pain and nausea. We were to have sent a referral to [facility] the end of March for possible Cholecystectomy,  but she has not heard anything from them. I called [facility] and spoke to Morgan(one of the referral coordinators). She can not get the patient in to see Dr. Harlow Asa or Dr. Ninfa Linden until August, so she is scheduling her on 11/14/20 with Dr. Rosendo Gros. She will call the patient today.

## 2020-11-11 NOTE — Telephone Encounter (Signed)
Inbound call from patient requesting a call from a nurse please.  Did not specify further.

## 2020-11-14 ENCOUNTER — Encounter (HOSPITAL_COMMUNITY): Payer: Self-pay | Admitting: General Surgery

## 2020-11-14 ENCOUNTER — Ambulatory Visit: Payer: Self-pay | Admitting: General Surgery

## 2020-11-14 ENCOUNTER — Other Ambulatory Visit: Payer: Self-pay

## 2020-11-14 DIAGNOSIS — K8020 Calculus of gallbladder without cholecystitis without obstruction: Secondary | ICD-10-CM | POA: Diagnosis not present

## 2020-11-14 NOTE — H&P (View-Only) (Signed)
History of Present Illness The patient is a 65 year old female who presents for evaluation of gall stones. Referred by: Dr. Kathyrn Lass Chief Complaint: Gallstones  Patient is a 65 year old female, who comes in with a history of PVCs, diabetes, hyperlipidemia, in no history of gallstones. Patient had multiple episodes of right quadrant abdominal pain, nausea, vomiting, after eating high fatty meals. Patient undergone ultrasound revealed gallstones in the past. I did review this personally. Patient was recently in the ER recently secondary to her quadrant abdominal pain. Patient was given pain medication. Patient's a previous laparoscopic GYN open hysterectomy in the past. He said previous lap appendectomy.     Past Surgical History Appendectomy  Breast Biopsy  Right. Tonsillectomy   Diagnostic Studies History Colonoscopy  5-10 years ago Mammogram  within last year Pap Smear  1-5 years ago  Allergies No Known Drug Allergies  [11/14/2020]: (Marked as Inactive) Azithromycin *MACROLIDES*  Amoxicillin-Pot Clavulanate *CHEMICALS*  Allergies Reconciled   Medication History  Vascepa (1GM Capsule, Oral) Active. Ezetimibe (10MG  Tablet, Oral) Active. Esomeprazole Magnesium (40MG  Capsule DR, Oral) Active. Flecainide Acetate (50MG  Tablet, Oral) Active. Alive Once Daily Womens (Oral) Active. hydroCHLOROthiazide (25MG  Tablet, Oral) Active. Hyoscyamine (0.15MG  Tablet, Oral) Active. Nitrofurantoin (25MG /5ML Suspension, Oral) Active. Medications Reconciled  Social History Alcohol use  Occasional alcohol use. Caffeine use  Coffee, Tea. No drug use  Tobacco use  Never smoker.  Family History Arthritis  Father. Breast Cancer  Family Members In General. Diabetes Mellitus  Brother, Family Members In General. Heart Disease  Father. Heart disease in female family member before age 27  Hypertension  Father.  Pregnancy / Birth History Age at menarche  67  years. Age of menopause  [age] Gravida  3 Length (months) of breastfeeding  3-6 Para  2 Regular periods   Other Problems Arthritis  Breast Cancer  Diabetes Mellitus  Gastroesophageal Reflux Disease  Heart murmur  Hypercholesterolemia  Lump In Breast     Review of Systems General Present- Appetite Loss and Weight Loss. Not Present- Chills, Fatigue, Fever, Night Sweats and Weight Gain. Skin Not Present- Change in Wart/Mole, Dryness, Hives, Jaundice, New Lesions, Non-Healing Wounds, Rash and Ulcer. HEENT Not Present- Earache, Hearing Loss, Hoarseness, Nose Bleed, Oral Ulcers, Ringing in the Ears, Seasonal Allergies, Sinus Pain, Sore Throat, Visual Disturbances, Wears glasses/contact lenses and Yellow Eyes. Respiratory Not Present- Bloody sputum, Chronic Cough, Difficulty Breathing, Snoring and Wheezing. Cardiovascular Present- Palpitations. Not Present- Chest Pain, Difficulty Breathing Lying Down, Leg Cramps, Rapid Heart Rate, Shortness of Breath and Swelling of Extremities. Gastrointestinal Present- Abdominal Pain, Bloating, Indigestion and Nausea. Not Present- Bloody Stool, Change in Bowel Habits, Chronic diarrhea, Constipation, Difficulty Swallowing, Excessive gas, Gets full quickly at meals, Hemorrhoids, Rectal Pain and Vomiting. Female Genitourinary Not Present- Frequency, Nocturia, Painful Urination, Pelvic Pain and Urgency. Musculoskeletal Present- Joint Pain. Not Present- Back Pain, Joint Stiffness, Muscle Pain, Muscle Weakness and Swelling of Extremities. Neurological Not Present- Decreased Memory, Fainting, Headaches, Numbness, Seizures, Tingling, Tremor, Trouble walking and Weakness. Psychiatric Not Present- Anxiety, Bipolar, Change in Sleep Pattern, Depression, Fearful and Frequent crying. Endocrine Not Present- Cold Intolerance, Excessive Hunger, Hair Changes, Heat Intolerance, Hot flashes and New Diabetes. Hematology Not Present- Blood Thinners, Easy Bruising,  Excessive bleeding, Gland problems, HIV and Persistent Infections. All other systems negative     Physical Exam The physical exam findings are as follows: Note: Constitutional: No acute distress, conversant, appears stated age  Eyes: Anicteric sclerae, moist conjunctiva, no lid lag  Neck: No thyromegaly, trachea  midline, no cervical lymphadenopathy  Lungs: Clear to auscultation biilaterally, normal respiratory effot  Cardiovascular: regular rate & rhythm, no murmurs, no peripheal edema, pedal pulses 2+  GI: Soft, no masses or hepatosplenomegaly, non-tender to palpation  MSK: Normal gait, no clubbing cyanosis, edema  Skin: No rashes, palpation reveals normal skin turgor  Psychiatric: Appropriate judgment and insight, oriented to person, place, and time    Assessment & Plan SYMPTOMATIC CHOLELITHIASIS (K80.20) Impression: 39F with sx gallstones 1. We will proceed to the operating room for a laparoscopic cholecystectomy  2. Risks and benefits were discussed with the patient to generally include, but not limited to: infection, bleeding, possible need for post op ERCP, damage to the bile ducts, bile leak, and possible need for further surgery. Alternatives were offered and described. All questions were answered and the patient voiced understanding of the procedure and wishes to proceed at this point with a laparoscopic cholecystectomy

## 2020-11-14 NOTE — Progress Notes (Signed)
Spoke with pt for pre-op call. Pt states she has a hx of PVC's. Her cardiologists are Dr. Debara Pickett and Dr. Lovena Le. She is on Flecainide and states it helps keep the PVC's under control. Pt is type 2 Diabetic. Last A1C was 5.9 on 07/25/20. Pt will not take Metformin the morning of surgery. Instructed pt to check her blood sugar when she gets up Tuesday AM and every 2 hours until she leaves for the hospital.  If blood sugar is 70 or below, treat with 1/2 cup of clear juice (apple or cranberry) and recheck blood sugar 15 minutes after drinking juice. If blood sugar continues to be 70 or below, call the Short Stay department and ask to speak to a nurse. Pt voiced understanding.  Pt's surgery is scheduled as ambulatory so no Covid test is required prior to surgery. Pt denies any Covid symptoms.

## 2020-11-14 NOTE — H&P (Signed)
History of Present Illness The patient is a 65 year old female who presents for evaluation of gall stones. Referred by: Dr. Kathyrn Lass Chief Complaint: Gallstones  Patient is a 65 year old female, who comes in with a history of PVCs, diabetes, hyperlipidemia, in no history of gallstones. Patient had multiple episodes of right quadrant abdominal pain, nausea, vomiting, after eating high fatty meals. Patient undergone ultrasound revealed gallstones in the past. I did review this personally. Patient was recently in the ER recently secondary to her quadrant abdominal pain. Patient was given pain medication. Patient's a previous laparoscopic GYN open hysterectomy in the past. He said previous lap appendectomy.     Past Surgical History Appendectomy  Breast Biopsy  Right. Tonsillectomy   Diagnostic Studies History Colonoscopy  5-10 years ago Mammogram  within last year Pap Smear  1-5 years ago  Allergies No Known Drug Allergies  [11/14/2020]: (Marked as Inactive) Azithromycin *MACROLIDES*  Amoxicillin-Pot Clavulanate *CHEMICALS*  Allergies Reconciled   Medication History  Vascepa (1GM Capsule, Oral) Active. Ezetimibe (10MG  Tablet, Oral) Active. Esomeprazole Magnesium (40MG  Capsule DR, Oral) Active. Flecainide Acetate (50MG  Tablet, Oral) Active. Alive Once Daily Womens (Oral) Active. hydroCHLOROthiazide (25MG  Tablet, Oral) Active. Hyoscyamine (0.15MG  Tablet, Oral) Active. Nitrofurantoin (25MG /5ML Suspension, Oral) Active. Medications Reconciled  Social History Alcohol use  Occasional alcohol use. Caffeine use  Coffee, Tea. No drug use  Tobacco use  Never smoker.  Family History Arthritis  Father. Breast Cancer  Family Members In General. Diabetes Mellitus  Brother, Family Members In General. Heart Disease  Father. Heart disease in female family member before age 62  Hypertension  Father.  Pregnancy / Birth History Age at menarche  29  years. Age of menopause  89-55 Gravida  3 Length (months) of breastfeeding  3-6 Para  2 Regular periods   Other Problems Arthritis  Breast Cancer  Diabetes Mellitus  Gastroesophageal Reflux Disease  Heart murmur  Hypercholesterolemia  Lump In Breast     Review of Systems General Present- Appetite Loss and Weight Loss. Not Present- Chills, Fatigue, Fever, Night Sweats and Weight Gain. Skin Not Present- Change in Wart/Mole, Dryness, Hives, Jaundice, New Lesions, Non-Healing Wounds, Rash and Ulcer. HEENT Not Present- Earache, Hearing Loss, Hoarseness, Nose Bleed, Oral Ulcers, Ringing in the Ears, Seasonal Allergies, Sinus Pain, Sore Throat, Visual Disturbances, Wears glasses/contact lenses and Yellow Eyes. Respiratory Not Present- Bloody sputum, Chronic Cough, Difficulty Breathing, Snoring and Wheezing. Cardiovascular Present- Palpitations. Not Present- Chest Pain, Difficulty Breathing Lying Down, Leg Cramps, Rapid Heart Rate, Shortness of Breath and Swelling of Extremities. Gastrointestinal Present- Abdominal Pain, Bloating, Indigestion and Nausea. Not Present- Bloody Stool, Change in Bowel Habits, Chronic diarrhea, Constipation, Difficulty Swallowing, Excessive gas, Gets full quickly at meals, Hemorrhoids, Rectal Pain and Vomiting. Female Genitourinary Not Present- Frequency, Nocturia, Painful Urination, Pelvic Pain and Urgency. Musculoskeletal Present- Joint Pain. Not Present- Back Pain, Joint Stiffness, Muscle Pain, Muscle Weakness and Swelling of Extremities. Neurological Not Present- Decreased Memory, Fainting, Headaches, Numbness, Seizures, Tingling, Tremor, Trouble walking and Weakness. Psychiatric Not Present- Anxiety, Bipolar, Change in Sleep Pattern, Depression, Fearful and Frequent crying. Endocrine Not Present- Cold Intolerance, Excessive Hunger, Hair Changes, Heat Intolerance, Hot flashes and New Diabetes. Hematology Not Present- Blood Thinners, Easy Bruising,  Excessive bleeding, Gland problems, HIV and Persistent Infections. All other systems negative     Physical Exam The physical exam findings are as follows: Note: Constitutional: No acute distress, conversant, appears stated age  Eyes: Anicteric sclerae, moist conjunctiva, no lid lag  Neck: No thyromegaly, trachea  midline, no cervical lymphadenopathy  Lungs: Clear to auscultation biilaterally, normal respiratory effot  Cardiovascular: regular rate & rhythm, no murmurs, no peripheal edema, pedal pulses 2+  GI: Soft, no masses or hepatosplenomegaly, non-tender to palpation  MSK: Normal gait, no clubbing cyanosis, edema  Skin: No rashes, palpation reveals normal skin turgor  Psychiatric: Appropriate judgment and insight, oriented to person, place, and time    Assessment & Plan SYMPTOMATIC CHOLELITHIASIS (K80.20) Impression: 10F with sx gallstones 1. We will proceed to the operating room for a laparoscopic cholecystectomy  2. Risks and benefits were discussed with the patient to generally include, but not limited to: infection, bleeding, possible need for post op ERCP, damage to the bile ducts, bile leak, and possible need for further surgery. Alternatives were offered and described. All questions were answered and the patient voiced understanding of the procedure and wishes to proceed at this point with a laparoscopic cholecystectomy

## 2020-11-17 NOTE — Anesthesia Preprocedure Evaluation (Addendum)
Anesthesia Evaluation  Patient identified by MRN, date of birth, ID band Patient awake    Reviewed: Allergy & Precautions, NPO status , Patient's Chart, lab work & pertinent test results  Airway Mallampati: II  TM Distance: >3 FB     Dental  (+) Dental Advisory Given   Pulmonary neg pulmonary ROS,    breath sounds clear to auscultation       Cardiovascular + dysrhythmias  Rhythm:Regular Rate:Normal     Neuro/Psych negative neurological ROS     GI/Hepatic Neg liver ROS, GERD  ,  Endo/Other  diabetes  Renal/GU negative Renal ROS     Musculoskeletal   Abdominal   Peds  Hematology   Anesthesia Other Findings   Reproductive/Obstetrics                            Anesthesia Physical Anesthesia Plan  ASA: II  Anesthesia Plan: General   Post-op Pain Management:    Induction: Intravenous  PONV Risk Score and Plan: 3 and Dexamethasone, Ondansetron, Midazolam and Treatment may vary due to age or medical condition  Airway Management Planned: Oral ETT  Additional Equipment:   Intra-op Plan:   Post-operative Plan: Extubation in OR  Informed Consent: I have reviewed the patients History and Physical, chart, labs and discussed the procedure including the risks, benefits and alternatives for the proposed anesthesia with the patient or authorized representative who has indicated his/her understanding and acceptance.     Dental advisory given  Plan Discussed with: CRNA  Anesthesia Plan Comments: ( )       Anesthesia Quick Evaluation

## 2020-11-17 NOTE — Progress Notes (Signed)
Anesthesia Chart Review: SAME DAY WORK-UP   Case: [id number] Date/Time: 11/18/20 0900   Procedure: LAPAROSCOPIC CHOLECYSTECTOMY (N/A )   Anesthesia type: General   Pre-op diagnosis: GALL STONES   Location: [facility] OR ROOM 01 / [facility]   Surgeons: Ralene Ok, MD      DISCUSSION: Patient is a 65 year old female scheduled for the above procedure.  History includes never smoker, PVCs (on flecainide), HLD, MVP, DM2, IBS, GERD, right breast cancer (right partial mastectomy 11/18/04 for high grade DCIS, with re-excision inferior margin 11/30/04), hyperthyroidism (2011), gastroparesis.   Reports PVCs controlled on flecainide. Last visit with Dr. Elsworth Soho in June 2021.   Anesthesia team to evaluate on the day of surgery.    VS:  BP Readings from Last 3 Encounters:  11/09/20 117/76  09/02/20 140/84  07/16/20 133/74   Pulse Readings from Last 3 Encounters:  11/09/20 60  09/02/20 76  07/16/20 (!) 58    PROVIDERS: Kathyrn Lass, MD is PCP - Lyman Bishop, MD is primary cardiologist. Last visit 11/21/19.  She was doing fairly well without chest pressure or worsening SOB.  Continue current dose of flecainide and if or breakthrough arrhythmias could consider adding a beta-blocker.  1 year follow-up planned. Cristopher Peru, MD is EP cardiologist. Last visit 09/14/19. Event monitor ordered which showed SB-ST, no prolonged pauses, rare PACs, no VT or SVT. Scarlette Shorts, MD is GI   LABS: Labs as of 11/09/20 include: Lab Results  Component Value Date   WBC 9.7 11/09/2020   HGB 14.8 11/09/2020   HCT 43.1 11/09/2020   PLT 328 11/09/2020   GLUCOSE 136 (H) 11/09/2020   ALT 17 11/09/2020   AST 20 11/09/2020   NA 136 11/09/2020   K 3.2 (L) 11/09/2020   CL 99 11/09/2020   CREATININE 0.83 11/09/2020   BUN 17 11/09/2020   CO2 26 11/09/2020   Reported last A1c 5.9% on 07/25/20.   IMAGES: [place] Abd 11/09/20: IMPRESSION: 1. No acute findings.  Normal gallbladder.  No bile duct dilation. 2. Mild increased  liver parenchymal echogenicity suggests hepatic steatosis.   EKG: 11/21/19: NSR, low voltage QRS   CV: Long term event monitor 10/23/19: Study Highlights 1. NSR with sinus brady and sinus tachycardia 2. No prolonged pauses 3. Rare PAC's 4. No VT or SVt   Nuclear stress test 08/23/19:  Nuclear stress EF: 66%.  The left ventricular ejection fraction is hyperdynamic (>65%).  There was no ST segment deviation noted during stress.  No T wave inversion was noted during stress.  The study is normal.  This is a low risk study.   Impression: 1. There is a small (<5%), mild apical defect present on rest and stress imaging. The wall motion in this region is normal. Overall, this is consistent with apical thinning artifact. 2. No ischemia.  3. Normal LVEF, 66%.  4. This is a low risk study.     Echo 05/14/13: Study Conclusions  - Left ventricle: The cavity size was normal. Systolic  function was normal. The estimated ejection fraction was  in the range of 55% to 60%. Wall motion was normal; there  were no regional wall motion abnormalities. Left  ventricular diastolic function parameters were normal.  - Aortic valve: Trivial regurgitation.  - Mitral valve: Valve area by pressure half-time: 2.2cm^2.    Cardiac cath 10/13/09: Impression: 1.  Normal left ventricular function. 2.  No significant fixed obstructive coronary artery disease with evidence for mild muscle bridging of the mid  and distal left anterior descending with improvement with intracoronary nitroglycerin administration without obstructive disease. 3.  Normal ramus intermediate vessel, normal circumflex vessel, and normal right coronary artery. Recommendations: Medical therapy.   Past Medical History:  Diagnosis Date  . ANXIETY 11/07/2009   Qualifier: Diagnosis of  By: Trellis Paganini PA-c, Amy S - came after her diagnosis of breast cancer  . Breast CA ([place])    right - radiation & surgery  . Diabetes ([facility])     Type II, diet control  . DIVERTICULOSIS, COLON 06/26/2004   Qualifier: Diagnosis of  By: Arsenio Loader RN, Ramond Craver    . DM2 (diabetes mellitus, type 2) ([place]) 11/07/2009   Qualifier: Diagnosis of  By: Trellis Paganini PA-c, Amy S   . Family history of heart disease   . Fatty liver   . Gallstone   . GASTRIC POLYP 03/30/2007   Qualifier: Diagnosis of  By: Arsenio Loader RN, Ramond Craver    . Gastroparesis    mild  . GERD 11/07/2009   Qualifier: Diagnosis of  By: Trellis Paganini PA-c, Amy S   . History of nuclear stress test 12/2011   bruce protocol; no evidence of ischemia, EF 79%, normal pattern of perfusion  . Hyperchloremia   . Hyperlipidemia   . HYPERTHYROIDISM 11/07/2009   Qualifier: Diagnosis of  By: Shane Crutch, Amy S   . Irritable bowel syndrome 08/04/2007   Qualifier: Diagnosis of  By: Arsenio Loader RN, Ramond Craver    . MVP (mitral valve prolapse)    last echo 07/1997 - normal LV size/function, mild MVP, mild MR  . OVARIAN CYST 08/04/2007   Qualifier: Diagnosis of  By: Arsenio Loader RN, Carissa    . Personal history of radiation therapy   . PVC (premature ventricular contraction)   . Vitamin D deficiency     Past Surgical History:  Procedure Laterality Date  . ABDOMINAL HYSTERECTOMY  03/2003  . APPENDECTOMY  01/2002  . BREAST LUMPECTOMY Right 2006   x2  . CARDIAC CATHETERIZATION  10/13/2009   no significant fixed obstructive CAD, normral ramus intermediate/circumflex/RCA; normal LV function (Dr. Corky Downs)  . LAPAROSCOPIC OOPHERECTOMY    . TONSILLECTOMY  1969    MEDICATIONS: No current facility-administered medications for this encounter.   Marland Kitchen aspirin EC 81 MG tablet  . Cholecalciferol (VITAMIN D3 GUMMIES PO)  . Cyanocobalamin (VITAMIN B-12 PO)  . esomeprazole (NEXIUM) 40 MG capsule  . ezetimibe (ZETIA) 10 MG tablet  . flecainide (TAMBOCOR) 100 MG tablet  . fluticasone (FLONASE) 50 MCG/ACT nasal spray  . Glucosamine HCl (GLUCOSAMINE PO)  . hydrochlorothiazide (HYDRODIURIL) 25 MG tablet  . hyoscyamine (LEVSIN  SL) 0.125 MG SL tablet  . metFORMIN (GLUCOPHAGE) 500 MG tablet  . Multiple Vitamins-Minerals (ADULT ONE DAILY GUMMIES PO)  . nitroGLYCERIN (NITROSTAT) 0.4 MG SL tablet  . ondansetron (ZOFRAN ODT) 4 MG disintegrating tablet  . ondansetron (ZOFRAN) 4 MG tablet  . simvastatin (ZOCOR) 20 MG tablet  . VASCEPA 1 g capsule  . HYDROcodone-acetaminophen (NORCO/VICODIN) 5-325 MG tablet  . Probiotic TBEC    Myra Gianotti, PA-C Surgical Short Stay/Anesthesiology [facility] Phone [phone] [facility] Phone [phone] 11/17/2020 4:38 PM

## 2020-11-18 ENCOUNTER — Encounter (HOSPITAL_COMMUNITY): Payer: Self-pay | Admitting: General Surgery

## 2020-11-18 ENCOUNTER — Ambulatory Visit (HOSPITAL_COMMUNITY)
Admission: RE | Admit: 2020-11-18 | Discharge: 2020-11-18 | Disposition: A | Discharge status: Home / Self Care | Payer: Medicare PPO | Attending: General Surgery | Admitting: General Surgery

## 2020-11-18 ENCOUNTER — Ambulatory Visit (HOSPITAL_COMMUNITY): Payer: Medicare PPO | Admitting: Vascular Surgery

## 2020-11-18 ENCOUNTER — Encounter (HOSPITAL_COMMUNITY)
Admission: RE | Disposition: A | Discharge status: Home / Self Care | Payer: Self-pay | Source: Home / Self Care | Attending: General Surgery

## 2020-11-18 DIAGNOSIS — Z853 Personal history of malignant neoplasm of breast: Secondary | ICD-10-CM | POA: Insufficient documentation

## 2020-11-18 DIAGNOSIS — Z79899 Other long term (current) drug therapy: Secondary | ICD-10-CM | POA: Diagnosis not present

## 2020-11-18 DIAGNOSIS — K3184 Gastroparesis: Secondary | ICD-10-CM | POA: Diagnosis not present

## 2020-11-18 DIAGNOSIS — Z833 Family history of diabetes mellitus: Secondary | ICD-10-CM | POA: Insufficient documentation

## 2020-11-18 DIAGNOSIS — Z881 Allergy status to other antibiotic agents status: Secondary | ICD-10-CM | POA: Insufficient documentation

## 2020-11-18 DIAGNOSIS — I493 Ventricular premature depolarization: Secondary | ICD-10-CM | POA: Diagnosis not present

## 2020-11-18 DIAGNOSIS — Z8249 Family history of ischemic heart disease and other diseases of the circulatory system: Secondary | ICD-10-CM | POA: Diagnosis not present

## 2020-11-18 DIAGNOSIS — Z803 Family history of malignant neoplasm of breast: Secondary | ICD-10-CM | POA: Insufficient documentation

## 2020-11-18 DIAGNOSIS — E785 Hyperlipidemia, unspecified: Secondary | ICD-10-CM | POA: Insufficient documentation

## 2020-11-18 DIAGNOSIS — Z880 Allergy status to penicillin: Secondary | ICD-10-CM | POA: Diagnosis not present

## 2020-11-18 DIAGNOSIS — K819 Cholecystitis, unspecified: Secondary | ICD-10-CM | POA: Diagnosis not present

## 2020-11-18 DIAGNOSIS — K8020 Calculus of gallbladder without cholecystitis without obstruction: Secondary | ICD-10-CM | POA: Diagnosis present

## 2020-11-18 DIAGNOSIS — Z8261 Family history of arthritis: Secondary | ICD-10-CM | POA: Insufficient documentation

## 2020-11-18 DIAGNOSIS — E1143 Type 2 diabetes mellitus with diabetic autonomic (poly)neuropathy: Secondary | ICD-10-CM | POA: Diagnosis not present

## 2020-11-18 DIAGNOSIS — K8010 Calculus of gallbladder with chronic cholecystitis without obstruction: Secondary | ICD-10-CM | POA: Diagnosis not present

## 2020-11-18 DIAGNOSIS — K811 Chronic cholecystitis: Secondary | ICD-10-CM | POA: Diagnosis not present

## 2020-11-18 DIAGNOSIS — Z7984 Long term (current) use of oral hypoglycemic drugs: Secondary | ICD-10-CM | POA: Insufficient documentation

## 2020-11-18 DIAGNOSIS — E559 Vitamin D deficiency, unspecified: Secondary | ICD-10-CM | POA: Diagnosis not present

## 2020-11-18 HISTORY — DX: Fatty (change of) liver, not elsewhere classified: K76.0

## 2020-11-18 HISTORY — PX: CHOLECYSTECTOMY: SHX55

## 2020-11-18 LAB — GLUCOSE, CAPILLARY
Glucose-Capillary: 110 mg/dL — ABNORMAL HIGH (ref 70–99)
Glucose-Capillary: 154 mg/dL — ABNORMAL HIGH (ref 70–99)

## 2020-11-18 SURGERY — LAPAROSCOPIC CHOLECYSTECTOMY
Anesthesia: General | Site: Abdomen

## 2020-11-18 MED ORDER — FENTANYL CITRATE (PF) 100 MCG/2ML IJ SOLN
25.0000 ug | INTRAMUSCULAR | Status: DC | PRN
  Administered 2020-11-18: 25 ug via INTRAVENOUS

## 2020-11-18 MED ORDER — PROPOFOL 10 MG/ML IV BOLUS
INTRAVENOUS | Status: AC
  Filled 2020-11-18: qty 20

## 2020-11-18 MED ORDER — ORAL CARE MOUTH RINSE: 15.0000 mL | Freq: Once | OROMUCOSAL | Status: AC

## 2020-11-18 MED ORDER — LACTATED RINGERS IV SOLN: INTRAVENOUS | Status: DC

## 2020-11-18 MED ORDER — BUPIVACAINE HCL (PF) 0.25 % IJ SOLN
INTRAMUSCULAR | Status: AC
  Filled 2020-11-18: qty 30

## 2020-11-18 MED ORDER — CIPROFLOXACIN IN D5W 400 MG/200ML IV SOLN
400.0000 mg | INTRAVENOUS | Status: AC
  Administered 2020-11-18 (×2): 400 mg via INTRAVENOUS

## 2020-11-18 MED ORDER — FENTANYL CITRATE (PF) 250 MCG/5ML IJ SOLN
INTRAMUSCULAR | Status: DC | PRN
  Administered 2020-11-18: 100 ug via INTRAVENOUS
  Administered 2020-11-18: 50 ug via INTRAVENOUS

## 2020-11-18 MED ORDER — ROCURONIUM BROMIDE 10 MG/ML (PF) SYRINGE
PREFILLED_SYRINGE | INTRAVENOUS | Status: DC | PRN
  Administered 2020-11-18: 50 mg via INTRAVENOUS

## 2020-11-18 MED ORDER — PROPOFOL 10 MG/ML IV BOLUS
INTRAVENOUS | Status: DC | PRN
  Administered 2020-11-18: 120 mg via INTRAVENOUS

## 2020-11-18 MED ORDER — ACETAMINOPHEN 500 MG PO TABS: 1000.0000 mg | ORAL_TABLET | ORAL | Status: AC

## 2020-11-18 MED ORDER — ENSURE PRE-SURGERY PO LIQD
296.0000 mL | Freq: Once | ORAL | Status: DC
Start: 2020-11-19 — End: 2020-11-18

## 2020-11-18 MED ORDER — 0.9 % SODIUM CHLORIDE (POUR BTL) OPTIME
TOPICAL | Status: DC | PRN
  Administered 2020-11-18: 1000 mL

## 2020-11-18 MED ORDER — SUGAMMADEX SODIUM 200 MG/2ML IV SOLN
INTRAVENOUS | Status: DC | PRN
  Administered 2020-11-18: 200 mg via INTRAVENOUS

## 2020-11-18 MED ORDER — ACETAMINOPHEN 500 MG PO TABS
ORAL_TABLET | ORAL | Status: AC
  Administered 2020-11-18: 1000 mg via ORAL
  Filled 2020-11-18: qty 2

## 2020-11-18 MED ORDER — EPHEDRINE SULFATE-NACL 50-0.9 MG/10ML-% IV SOSY
PREFILLED_SYRINGE | INTRAVENOUS | Status: DC | PRN
  Administered 2020-11-18 (×2): 5 mg via INTRAVENOUS

## 2020-11-18 MED ORDER — CHLORHEXIDINE GLUCONATE 0.12 % MT SOLN: 15.0000 mL | Freq: Once | OROMUCOSAL | Status: AC

## 2020-11-18 MED ORDER — FENTANYL CITRATE (PF) 250 MCG/5ML IJ SOLN
INTRAMUSCULAR | Status: AC
  Filled 2020-11-18: qty 5

## 2020-11-18 MED ORDER — BUPIVACAINE HCL 0.25 % IJ SOLN
INTRAMUSCULAR | Status: DC | PRN
  Administered 2020-11-18: 12 mL

## 2020-11-18 MED ORDER — MIDAZOLAM HCL 2 MG/2ML IJ SOLN
INTRAMUSCULAR | Status: AC
  Filled 2020-11-18: qty 2

## 2020-11-18 MED ORDER — SODIUM CHLORIDE 0.9 % IR SOLN
Status: DC | PRN
  Administered 2020-11-18: 1000 mL

## 2020-11-18 MED ORDER — ONDANSETRON HCL 4 MG/2ML IJ SOLN
INTRAMUSCULAR | Status: DC | PRN
  Administered 2020-11-18: 4 mg via INTRAVENOUS

## 2020-11-18 MED ORDER — AMISULPRIDE (ANTIEMETIC) 5 MG/2ML IV SOLN
10.0000 mg | Freq: Once | INTRAVENOUS | Status: AC | PRN
  Administered 2020-11-18: 10 mg via INTRAVENOUS

## 2020-11-18 MED ORDER — MIDAZOLAM HCL 2 MG/2ML IJ SOLN
INTRAMUSCULAR | Status: DC | PRN
  Administered 2020-11-18: 2 mg via INTRAVENOUS

## 2020-11-18 MED ORDER — FENTANYL CITRATE (PF) 100 MCG/2ML IJ SOLN
INTRAMUSCULAR | Status: AC
  Filled 2020-11-18: qty 2

## 2020-11-18 MED ORDER — CHLORHEXIDINE GLUCONATE CLOTH 2 % EX PADS: 6.0000 | MEDICATED_PAD | Freq: Once | CUTANEOUS | Status: DC

## 2020-11-18 MED ORDER — EPHEDRINE 5 MG/ML INJ
INTRAVENOUS | Status: AC
  Filled 2020-11-18: qty 20

## 2020-11-18 MED ORDER — AMISULPRIDE (ANTIEMETIC) 5 MG/2ML IV SOLN
INTRAVENOUS | Status: AC
  Filled 2020-11-18: qty 4

## 2020-11-18 MED ORDER — GLYCOPYRROLATE 0.2 MG/ML IJ SOLN
INTRAMUSCULAR | Status: DC | PRN
  Administered 2020-11-18: .2 mg via INTRAVENOUS

## 2020-11-18 MED ORDER — CHLORHEXIDINE GLUCONATE 0.12 % MT SOLN
OROMUCOSAL | Status: AC
  Administered 2020-11-18: 15 mL via OROMUCOSAL
  Filled 2020-11-18: qty 15

## 2020-11-18 MED ORDER — GLYCOPYRROLATE PF 0.2 MG/ML IJ SOSY
PREFILLED_SYRINGE | INTRAMUSCULAR | Status: AC
  Filled 2020-11-18: qty 3

## 2020-11-18 MED ORDER — CIPROFLOXACIN IN D5W 400 MG/200ML IV SOLN
INTRAVENOUS | Status: AC
  Filled 2020-11-18: qty 200

## 2020-11-18 MED ORDER — DEXAMETHASONE SODIUM PHOSPHATE 10 MG/ML IJ SOLN
INTRAMUSCULAR | Status: DC | PRN
  Administered 2020-11-18: 10 mg via INTRAVENOUS

## 2020-11-18 MED ORDER — LIDOCAINE 2% (20 MG/ML) 5 ML SYRINGE
INTRAMUSCULAR | Status: DC | PRN
  Administered 2020-11-18: 60 mg via INTRAVENOUS

## 2020-11-18 MED ORDER — TRAMADOL HCL 50 MG PO TABS: 50.0000 mg | ORAL_TABLET | Freq: Four times a day (QID) | ORAL | 0 refills | Status: DC | PRN

## 2020-11-18 SURGICAL SUPPLY — 42 items
ADH SKN CLS APL DERMABOND .7 (GAUZE/BANDAGES/DRESSINGS) ×1
APL PRP STRL LF DISP 70% ISPRP (MISCELLANEOUS) ×1
BAG SPEC RTRVL 10 TROC 200 (ENDOMECHANICALS)
CANISTER SUCT 3000ML PPV (MISCELLANEOUS) ×2 IMPLANT
CHLORAPREP W/TINT 26 (MISCELLANEOUS) ×2 IMPLANT
CLIP VESOLOCK MED LG 6/CT (CLIP) ×2 IMPLANT
COVER SURGICAL LIGHT HANDLE (MISCELLANEOUS) ×2 IMPLANT
COVER TRANSDUCER ULTRASND (DRAPES) ×2 IMPLANT
COVER WAND RF STERILE (DRAPES) ×2 IMPLANT
DERMABOND ADVANCED (GAUZE/BANDAGES/DRESSINGS) ×1
DERMABOND ADVANCED .7 DNX12 (GAUZE/BANDAGES/DRESSINGS) ×1 IMPLANT
ELECT REM PT RETURN 9FT ADLT (ELECTROSURGICAL) ×2
ELECTRODE REM PT RTRN 9FT ADLT (ELECTROSURGICAL) ×1 IMPLANT
GLOVE BIO SURGEON STRL SZ7.5 (GLOVE) ×2 IMPLANT
GOWN STRL REUS W/ TWL LRG LVL3 (GOWN DISPOSABLE) ×2 IMPLANT
GOWN STRL REUS W/ TWL XL LVL3 (GOWN DISPOSABLE) ×1 IMPLANT
GOWN STRL REUS W/TWL LRG LVL3 (GOWN DISPOSABLE) ×4
GOWN STRL REUS W/TWL XL LVL3 (GOWN DISPOSABLE) ×2
GRASPER SUT TROCAR 14GX15 (MISCELLANEOUS) ×2 IMPLANT
KIT BASIN OR (CUSTOM PROCEDURE TRAY) ×2 IMPLANT
KIT TURNOVER KIT B (KITS) ×2 IMPLANT
NDL INSUFFLATION 14GA 120MM (NEEDLE) ×1 IMPLANT
NEEDLE INSUFFLATION 14GA 120MM (NEEDLE) ×2 IMPLANT
NS IRRIG 1000ML POUR BTL (IV SOLUTION) ×2 IMPLANT
PAD ARMBOARD 7.5X6 YLW CONV (MISCELLANEOUS) ×2 IMPLANT
POUCH LAPAROSCOPIC INSTRUMENT (MISCELLANEOUS) ×2 IMPLANT
POUCH RETRIEVAL ECOSAC 10 (ENDOMECHANICALS) IMPLANT
POUCH RETRIEVAL ECOSAC 10MM (ENDOMECHANICALS)
SCISSORS LAP 5X35 DISP (ENDOMECHANICALS) ×2 IMPLANT
SET IRRIG TUBING LAPAROSCOPIC (IRRIGATION / IRRIGATOR) ×2 IMPLANT
SET TUBE SMOKE EVAC HIGH FLOW (TUBING) ×2 IMPLANT
SLEEVE ENDOPATH XCEL 5M (ENDOMECHANICALS) ×3 IMPLANT
SPECIMEN JAR SMALL (MISCELLANEOUS) ×2 IMPLANT
SUT MNCRL AB 4-0 PS2 18 (SUTURE) ×2 IMPLANT
SUT VICRYL 0 UR6 27IN ABS (SUTURE) ×2 IMPLANT
TOWEL GREEN STERILE (TOWEL DISPOSABLE) ×2 IMPLANT
TOWEL GREEN STERILE FF (TOWEL DISPOSABLE) ×2 IMPLANT
TRAY LAPAROSCOPIC MC (CUSTOM PROCEDURE TRAY) ×2 IMPLANT
TROCAR XCEL NON-BLD 11X100MML (ENDOMECHANICALS) ×2 IMPLANT
TROCAR XCEL NON-BLD 5MMX100MML (ENDOMECHANICALS) ×2 IMPLANT
WARMER LAPAROSCOPE (MISCELLANEOUS) ×2 IMPLANT
WATER STERILE IRR 1000ML POUR (IV SOLUTION) ×2 IMPLANT

## 2020-11-18 NOTE — Anesthesia Procedure Notes (Signed)
Procedure Name: Intubation Date/Time: 11/18/2020 9:28 AM Performed by: Mariea Clonts, CRNA Pre-anesthesia Checklist: Patient identified, Emergency Drugs available, Suction available and Patient being monitored Patient Re-evaluated:Patient Re-evaluated prior to induction Oxygen Delivery Method: Circle System Utilized Preoxygenation: Pre-oxygenation with 100% oxygen Induction Type: IV induction Ventilation: Mask ventilation without difficulty and Oral airway inserted - appropriate to patient size Laryngoscope Size: Sabra Heck and 2 Grade View: Grade II Tube type: Oral Tube size: 7.0 mm Number of attempts: 1 Airway Equipment and Method: Stylet and Oral airway Placement Confirmation: ETT inserted through vocal cords under direct vision,  positive ETCO2 and breath sounds checked- equal and bilateral Tube secured with: Tape Dental Injury: Teeth and Oropharynx as per pre-operative assessment

## 2020-11-18 NOTE — Interval H&P Note (Signed)
History and Physical Interval Note:  11/18/2020 8:41 AM  [name]  has presented today for surgery, with the diagnosis of GALL STONES.  The various methods of treatment have been discussed with the patient and family. After consideration of risks, benefits and other options for treatment, the patient has consented to  Procedure(s): LAPAROSCOPIC CHOLECYSTECTOMY (N/A) as a surgical intervention.  The patient's history has been reviewed, patient examined, no change in status, stable for surgery.  I have reviewed the patient's chart and labs.  Questions were answered to the patient's satisfaction.     Ralene Ok

## 2020-11-18 NOTE — Progress Notes (Signed)
75 mcg IV fentanyl wasted in stericycle with Cristina Gong, RN after patient discharge.

## 2020-11-18 NOTE — Discharge Instructions (Signed)
[facility] ______[facility] °LAPAROSCOPIC SURGERY: POST OP INSTRUCTIONS °Always review your discharge instruction sheet given to you by the facility where your surgery was performed. °IF YOU HAVE DISABILITY OR FAMILY LEAVE FORMS, YOU MUST BRING THEM TO THE OFFICE FOR PROCESSING.   °DO NOT GIVE THEM TO YOUR DOCTOR. ° °1. A prescription for pain medication may be given to you upon discharge.  Take your pain medication as prescribed, if needed.  If narcotic pain medicine is not needed, then you may take acetaminophen (Tylenol) or ibuprofen (Advil) as needed. °2. Take your usually prescribed medications unless otherwise directed. °3. If you need a refill on your pain medication, please contact your pharmacy.  They will contact our office to request authorization. Prescriptions will not be filled after 5pm or on week-ends. °4. You should follow a light diet the first few days after arrival home, such as soup and crackers, etc.  Be sure to include lots of fluids daily. °5. Most patients will experience some swelling and bruising in the area of the incisions.  Ice packs will help.  Swelling and bruising can take several days to resolve.  °6. It is common to experience some constipation if taking pain medication after surgery.  Increasing fluid intake and taking a stool softener (such as Colace) will usually help or prevent this problem from occurring.  A mild laxative (Milk of Magnesia or Miralax) should be taken according to package instructions if there are no bowel movements after 48 hours. °7. Unless discharge instructions indicate otherwise, you may remove your bandages 24-48 hours after surgery, and you may shower at that time.  You may have steri-strips (small skin tapes) in place directly over the incision.  These strips should be left on the skin for 7-10 days.  If your surgeon used skin glue on the incision, you may shower in 24 hours.  The glue will flake off over the next 2-3 weeks.  Any sutures or  staples will be removed at the office during your follow-up visit. °8. ACTIVITIES:  You may resume regular (light) daily activities beginning the next day--such as daily self-care, walking, climbing stairs--gradually increasing activities as tolerated.  You may have sexual intercourse when it is comfortable.  Refrain from any heavy lifting or straining until approved by your doctor. °a. You may drive when you are no longer taking prescription pain medication, you can comfortably wear a seatbelt, and you can safely maneuver your car and apply brakes. °b. RETURN TO WORK:  __________________________________________________________ °9. You should see your doctor in the office for a follow-up appointment approximately 2-3 weeks after your surgery.  Make sure that you call for this appointment within a day or two after you arrive home to insure a convenient appointment time. °10. OTHER INSTRUCTIONS: __________________________________________________________________________________________________________________________ __________________________________________________________________________________________________________________________ °WHEN TO CALL YOUR DOCTOR: °1. Fever over 101.0 °2. Inability to urinate °3. Continued bleeding from incision. °4. Increased pain, redness, or drainage from the incision. °5. Increasing abdominal pain ° °The clinic staff is available to answer your questions during regular business hours.  Please don’t hesitate to call and ask to speak to one of the nurses for clinical concerns.  If you have a medical emergency, go to the nearest emergency room or call 911.  A surgeon from [facility] is always on call at the hospital. °[address], [address], [place], [place]  [address] ? [address] [address], [place], [place]   [address] °[phone] ? [phone] ? FAX [phone] °Web site:   [email or web address] °

## 2020-11-18 NOTE — Anesthesia Postprocedure Evaluation (Signed)
Anesthesia Post Note  Patient: [name]  Procedure(s) Performed: LAPAROSCOPIC CHOLECYSTECTOMY (N/A Abdomen)     Patient location during evaluation: PACU Anesthesia Type: General Level of consciousness: awake and alert Pain management: pain level controlled Vital Signs Assessment: post-procedure vital signs reviewed and stable Respiratory status: spontaneous breathing, nonlabored ventilation, respiratory function stable and patient connected to nasal cannula oxygen Cardiovascular status: blood pressure returned to baseline and stable Postop Assessment: no apparent nausea or vomiting Anesthetic complications: no   No complications documented.  Last Vitals:  Vitals:   11/18/20 1038 11/18/20 1053  BP: 134/75 125/76  Pulse: 66 74  Resp: 17 12  Temp:  36.4 C  SpO2: 100% 96%    Last Pain:  Vitals:   11/18/20 1053  TempSrc:   PainSc: 2                  Tiajuana Amass

## 2020-11-18 NOTE — Op Note (Addendum)
11/18/2020  10:08 AM  PATIENT:  [name]  65 y.o. female  PRE-OPERATIVE DIAGNOSIS:  GALLSTONES  POST-OPERATIVE DIAGNOSIS:  GALLSTONES, CHRONIC CHOLECYSTITIS  PROCEDURE:  Procedure(s): LAPAROSCOPIC CHOLECYSTECTOMY (N/A)  SURGEON:  Surgeon(s) and Role:    * Ralene Ok, MD - Primary  ASSISTANTS: Mosie Lukes, MD, PGY-4   ANESTHESIA:   local and general  EBL:  10 mL   BLOOD ADMINISTERED:none  DRAINS: none   LOCAL MEDICATIONS USED:  BUPIVICAINE   SPECIMEN:  Source of Specimen:  gallbladder  DISPOSITION OF SPECIMEN:  PATHOLOGY  COUNTS:  YES  TOURNIQUET:  * No tourniquets in log *  DICTATION: .Dragon Dictation  Findings: chronically inflamed gallbladder  The patient was taken to the operating and placed in the supine position with bilateral SCDs in place.  The patient was prepped and draped in the usual sterile fashion. A time out was called and all facts were verified. A pneumoperitoneum was obtained via A Veress needle technique to a pressure of 7mm of mercury.  A 46mm trochar was then placed in the right upper quadrant under visualization, and there were no injuries to any abdominal organs. A 11 mm port was then placed in the umbilical region after infiltrating with local anesthesia under direct visualization. A second and third epigastric port and right lower quadrant port placement under direct visualization, respectively.    The gallbladder was identified and retracted, the peritoneum was then sharply dissected from the gallbladder and this dissection was carried down to Calot's triangle. The cystic duct was identified and stripped away circumferentially and seen going into the gallbladder 360, the critical angle was obtained.  2 hemoclips were placed proximally one distally and the cystic duct transected. The cystic artery was identified and 2 hemoclips placed proximally and one distally and transected.  We then proceeded to remove the gallbladder off the  hepatic fossa with Bovie cautery. There was soma spillage of bile that was irrigated out. A retrieval bag was then placed in the abdomen and gallbladder placed in the bag. The hepatic fossa was then reexamined and hemostasis was achieved with Bovie cautery and was excellent at the end of the case.   The subhepatic fossa and perihepatic fossa was then irrigated until the effluent was clear.  The gallbladder and bag were removed from the abdominal cavity. The 11 mm trocar fascia was reapproximated with the Endo Close #1 Vicryl x3.  The pneumoperitoneum was evacuated and all trochars removed under direct visulalization.  The skin was then closed with 4-0 Monocryl and the skin dressed with Dermabond.    The patient was awaken from general anesthesia and taken to the recovery room in stable condition.  I was personally present during the key and critical portions of this procedure and immediately available throughout the entire procedure, as documented in my operative note.   PLAN OF CARE: Discharge to home after PACU  PATIENT DISPOSITION:  PACU - hemodynamically stable.   Delay start of Pharmacological VTE agent (>24hrs) due to surgical blood loss or risk of bleeding: not applicable

## 2020-11-18 NOTE — Transfer of Care (Signed)
Immediate Anesthesia Transfer of Care Note  Patient: [name]  Procedure(s) Performed: LAPAROSCOPIC CHOLECYSTECTOMY (N/A Abdomen)  Patient Location: PACU  Anesthesia Type:General  Level of Consciousness: awake, alert  and oriented  Airway & Oxygen Therapy: Patient Spontanous Breathing and Patient connected to nasal cannula oxygen  Post-op Assessment: Report given to RN and Post -op Vital signs reviewed and stable  Post vital signs: Reviewed and stable  Last Vitals:  Vitals Value Taken Time  BP 145/83 11/18/20 1023  Temp 36.4 C 11/18/20 1023  Pulse 76 11/18/20 1027  Resp 9 11/18/20 1027  SpO2 100 % 11/18/20 1027  Vitals shown include unvalidated device data.  Last Pain:  Vitals:   11/18/20 1023  TempSrc:   PainSc: 0-No pain         Complications: No complications documented.

## 2020-11-19 ENCOUNTER — Encounter (HOSPITAL_COMMUNITY): Payer: Self-pay | Admitting: General Surgery

## 2020-11-19 LAB — SURGICAL PATHOLOGY

## 2020-11-20 ENCOUNTER — Other Ambulatory Visit: Payer: Medicare PPO

## 2020-11-25 DIAGNOSIS — M1712 Unilateral primary osteoarthritis, left knee: Secondary | ICD-10-CM | POA: Diagnosis not present

## 2020-11-25 DIAGNOSIS — E785 Hyperlipidemia, unspecified: Secondary | ICD-10-CM | POA: Diagnosis not present

## 2020-11-26 LAB — NMR, LIPOPROFILE
Cholesterol, Total: 128 mg/dL (ref 100–199)
HDL Particle Number: 28.9 umol/L — ABNORMAL LOW (ref >=30.5)
HDL-C: 25 mg/dL — ABNORMAL LOW (ref >39)
LDL Particle Number: 956 nmol/L (ref <1000)
LDL Size: 20 nm — ABNORMAL LOW (ref >20.5)
LDL-C (NIH Calc): 62 mg/dL (ref 0–99)
LP-IR Score: 54 — ABNORMAL HIGH (ref <=45)
Small LDL Particle Number: 700 nmol/L — ABNORMAL HIGH (ref <=527)
Triglycerides: 255 mg/dL — ABNORMAL HIGH (ref 0–149)

## 2020-11-26 NOTE — Progress Notes (Signed)
OV 6/16

## 2020-11-27 ENCOUNTER — Encounter: Payer: Self-pay | Admitting: Internal Medicine

## 2020-11-27 ENCOUNTER — Ambulatory Visit (INDEPENDENT_AMBULATORY_CARE_PROVIDER_SITE_OTHER): Payer: Medicare PPO | Admitting: Internal Medicine

## 2020-11-27 ENCOUNTER — Other Ambulatory Visit: Payer: Self-pay

## 2020-11-27 VITALS — BP 126/84 | HR 60 | Ht 62.0 in | Wt 139.6 lb

## 2020-11-27 DIAGNOSIS — I25119 Atherosclerotic heart disease of native coronary artery with unspecified angina pectoris: Secondary | ICD-10-CM

## 2020-11-27 DIAGNOSIS — E785 Hyperlipidemia, unspecified: Secondary | ICD-10-CM

## 2020-11-27 DIAGNOSIS — I493 Ventricular premature depolarization: Secondary | ICD-10-CM

## 2020-11-27 DIAGNOSIS — Z5181 Encounter for therapeutic drug level monitoring: Secondary | ICD-10-CM | POA: Diagnosis not present

## 2020-11-27 DIAGNOSIS — Z79899 Other long term (current) drug therapy: Secondary | ICD-10-CM

## 2020-11-27 NOTE — Progress Notes (Signed)
OFFICE NOTE  Chief Complaint:  Routine follow-up  Primary Care Physician: Kathyrn Lass, MD  HPI:  [name] is a 65 year old female previously followed by Dr. Rex Kras with a history of lower extremity edema recently and chest discomfort in the spring which she underwent a stress test for that was negative for ischemia with a preserved EF of 71%. She has had no further episodes of chest pain, and her edema has all but resolved, however, she stayed on the hydrochlorothiazide 25 mg daily. She also has hypertension which has been well controlled and a history of breast cancer but is now cancer free. Recently she had a fall and fractured her right humerus and has not been as active from that but has since started to get back to some more activity. 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 She recently had laboratory work which shows a well controlled lipid profile, total cholesterol 185, triglycerides 286, HDL 36 and LDL 92. This is on fenofibrate. She has had a history of elevated triglycerides in the past, but is nondiabetic.  She was doing very well when I saw her last week and recommended a dental holiday. She was not having any significant palpitations or PVCs that she mentioned. Less than a week after her office visit, she reported a day where she felt "terrible all day". She reported having numerous PVCs throughout the day and she says that that completely "zaps her energy". The next day she felt very fatigued and today, 2 days later she still feels quite weakened. Her EKG in the office today shows no evidence of PVCs.  This is apparently been an ongoing problem for years for her, was advanced coming paroxysmally and being associated with extreme fatigue.  I had referred her to see Dr. Cristopher Peru with cardiac electrophysiology. He recommended starting her on low-dose flecainide. Since starting that she has reported feeling much better. She was  placed on a longer monitor which showed essentially sinus rhythm and 1 atrial couplet which she noted a skipped beat otherwise no paroxysmal atrial fibrillation, PVC's, nonsustained VT or other arrhythmias were noted. Dr. Lovena Le recommended weaning off atenolol and she is currently on flecainide 75 mg twice daily.  [name] returns today for followup. She reports her palpitations are essentially gone and flecainide. She denies any chest pain. Unfortunately she's had recurrent sinusitis and is currently on doxycycline. She's had issues with her sinuses for a long time and now is also having problems with her years. I recommended she see an ear nose and throat doctor. She has had improvement in her lipid profile on current medications. Her LDL particle number is down to 1480, from 1824. Calculated LDL is down to 80 from 116 and triglycerides are mildly elevated still at 191.  [name] returns today for follow-up of her palpitations. She reports a been generally well controlled on flecainide. She is currently on senna 5 mg twice daily. Her last stress test was in 2013. She denies any chest pain or worsening shortness of breath. On aspirin for anticoagulation, given her young age and infrequency of symptoms.b  I saw [name] back in the office today for follow-up. Overall she reports very good control of her palpitations. She denies any chest pain or shortness of breath. She reports her reflux symptoms are well controlled. She recently had a cholesterol check however that result is still pending. Were hoping to see an improvement in  cholesterol with the addition of fenofibrate. She is retired from [employer] job and now works full-time in [employer].   10/13/2015  [name] returns today for follow-up. She is without any complaints. She is overall doing well and continues to be active. She denies any recurrent atrial fibrillation. She is maintaining sinus rhythm at 63 with a QTC of 431 ms.  She is aware to avoid certain medications with her flecainide which is currently 75 mg twice a day. Cholesterol is been well controlled and is due for repeat check.  10/14/2016  [name] returns today for follow-up. She recently broke the great toe on her right foot. She is currently in a walking boot. Hopefully that will come off next week. She denies any chest pain or worsening shortness of breath. She says her PVCs are well controlled on flecainide. We reviewed her most recent lipid profile which demonstrates total cholesterol of 144, HDL-C 30, triglycerides 188, LDL-C of 86. LDL-P of 1194, non-HDL-C of 114 and ApoB of 82.    10/26/2017  [name] returns today for follow-up.  She underwent repeat lipid particle testing which showed LDL P of 1151, which is stable compared to her values last year.  She reports recently an increase in some PVCs, particularly exertionally and some recent shortness of breath when she was moving chairs at a funeral.  Blood pressure is well controlled.  She underwent a routine treadmill stress test given her flecainide use.  This did show some borderline abnormalities including 2 mm of J-point depression with upsloping depression at peak exercise and some mild horizontal ST segment depression less than 1 mm.  She was able to exercise for 9 minutes and 10 metabolic equivalents.  She did report shortness of breath and fatigue during the study.  These are new symptoms for her.  Is not clear whether this could be angina on the study is borderline abnormal.  01/12/2018  [name] returns for follow-up.  Recently she underwent exercise treadmill stress testing for flecainide and was found to have some borderline abnormalities including some J-point depression and horizontal ST segment depression.  She did report shortness of breath and fatigue during the study.  Subsequently she has had 3 separate episodes of acute onset chest discomfort which lasted only for a few minutes  then followed by PVCs.  All of these were associated with minimal exertion, such as pushing her granddaughter around in the stroller.  She was sent for a coronary CT angiogram.  12/13/2017 and demonstrated mild nonobstructive predominantly calcified plaque in the distal left main and ostial/proximal LAD between 25 and 50%.  Aggressive risk factor modification was recommended.  There is no obstructive coronary disease.  Coronary calcium score was 161.  Continues to have concerns about chest pain, although I try to reassure based on these findings.  I suspect she may be having symptomatic PVCs.  This was well controlled previously on flecainide however is more prevalent.  In addition today we discussed her lipid profile.  Her LDL-P remains elevated at 1151.  Her goal LDL-P is less than 700 and given her coronary artery disease, I think she is a good candidate to add PCSK9 inhibitor therapy.  Is currently on maximally tolerated therapy with simvastatin 20 mg, fenofibrate 145 and ezetimibe 10 mg daily.  02/13/2019  [name] is seen today in follow-up.  She seems to be doing pretty well.  This spring she had some kind of a viral illness with fever and diarrhea  that was determined to be a colitis.  She lost some weight with that and then ultimately became an overt diabetic.  She is now on metformin and blood sugars are better controlled.  She is lost some weight again after gaining it back and is just barely in the overweight category.  Blood pressure looks good today.  In fact overall she looks fairly well.  She had repeat lipid testing which showed LDL-P of 1202, LDL-C 57, triglycerides 181 and a small LDL P of 904 which is elevated.  Based on these findings, she does have some discordance in her lipid profile suggesting she is at persistent risk.  This is important because she was found to have some mild to moderate proximal to ostial LAD disease on CT scan which was calcified that we are treating aggressively.  At  this point her triglycerides remain elevated and this may also be a target of treatment.  She is on flecainide without recurrent PVCs.  We talked about the use of class Ic antiarrhythmics in patients with coronary disease.  I think were okay to continue it without active angina however I would reach out to Dr. Lovena Le to see if he is okay with continuing the flecainide.  08/13/2019  [name] is seen today in follow-up.  She recently had repeat lipid NMR which showed further improvement in her LDL-P and overall cholesterol profile.  Her hemoglobin A1c is down to 6.  Based on this it seems that she is improving with changes in her diet.  She reports less stress as she retired from [employer].  She is very concerned about her heart, however especially since she was found to have coronary disease by CT angiography.  She is remained on flecainide and reports recently an increase in her PVCs.  She says with exercise or exertion she notes increase in heart rate and PVCs with heart rates up to the 150s.  She does have 1/5 generation apple watch and she has been monitoring this closely.  She reports chest pressure during these episodes but no real chest pain.  Her last exercise treadmill stress test was in 2019.  6/92021  [name] is seen today in follow-up.  Overall she is doing well.  She had been seen by Dr. Lovena Le and wore monitor for possible increase in PVCs however during that period of monitoring there was no significant arrhythmia on the monitor was significantly normal.  She says she has been doing better since retiring from [employer].  She seems to be more relaxed.  Lipids were stable recently if not slightly improved.  Her blood pressure was minimally elevated today.  11/27/2020  [name] returns today for follow-up.  Overall she says she is feeling well.  Recently however she had problems with her gallbladder.  She underwent cholecystectomy.  Since then she has been losing weight.  She is changed her  diet and cut out fats.  Weight is down to 139 from 151 pounds.  Her particle numbers have come down as well from 1093-9 56 with LDL 62.  Triglycerides are slightly higher and she noted that she did stop her Vascepa.  Small LDL P is lower at 700.  Blood pressure is well controlled and EKG shows a sinus rhythm.  PMHx:  Past Medical History:  Diagnosis Date   ANXIETY 11/07/2009   Qualifier: Diagnosis of  By: Trellis Paganini PA-c, Amy S - came after her diagnosis of breast cancer   Breast CA ([place])    right -  radiation & surgery   Diabetes ([place])    Type II, diet control   DIVERTICULOSIS, COLON 06/26/2004   Qualifier: Diagnosis of  By: Arsenio Loader RN, Carissa     DM2 (diabetes mellitus, type 2) ([place]) 11/07/2009   Qualifier: Diagnosis of  By: Shane Crutch, Amy S    Family history of heart disease    Fatty liver    Gallstone    GASTRIC POLYP 03/30/2007   Qualifier: Diagnosis of  By: Arsenio Loader RN, Carissa     Gastroparesis    mild   GERD 11/07/2009   Qualifier: Diagnosis of  By: Shane Crutch, Amy S    History of nuclear stress test 12/2011   bruce protocol; no evidence of ischemia, EF 79%, normal pattern of perfusion   Hyperchloremia    Hyperlipidemia    HYPERTHYROIDISM 11/07/2009   Qualifier: Diagnosis of  By: Trellis Paganini PA-c, Amy S    Irritable bowel syndrome 08/04/2007   Qualifier: Diagnosis of  By: Arsenio Loader RN, Carissa     MVP (mitral valve prolapse)    last echo 07/1997 - normal LV size/function, mild MVP, mild MR   OVARIAN CYST 08/04/2007   Qualifier: Diagnosis of  By: Arsenio Loader RN, Ramond Craver     Personal history of radiation therapy    PVC (premature ventricular contraction)    Vitamin D deficiency     Past Surgical History:  Procedure Laterality Date   ABDOMINAL HYSTERECTOMY  03/2003   APPENDECTOMY  01/2002   BREAST LUMPECTOMY Right 2006   x2   CARDIAC CATHETERIZATION  10/13/2009   no significant fixed obstructive CAD, normral ramus intermediate/circumflex/RCA; normal LV function (Dr. Corky Downs)    CHOLECYSTECTOMY N/A 11/18/2020   Procedure: LAPAROSCOPIC CHOLECYSTECTOMY;  Surgeon: Ralene Ok, MD;  Location: [facility];  Service: General;  Laterality: N/A;   LAPAROSCOPIC OOPHERECTOMY     TONSILLECTOMY  1969    FAMHx:  Family History  Problem Relation Age of Onset   Diabetes Maternal Grandmother    Heart failure Maternal Grandmother    Emphysema Maternal Grandfather    Breast cancer Paternal Grandmother    Heart disease Paternal Grandfather    Alzheimer's disease Mother    Heart failure Father    Hypertension Father        also murmur, hyperlipidemia, [name], CABG age 24   Diabetes Brother    Heart disease Brother 15       [name] at 97   Hypertension Brother    Hyperlipidemia Sister    Breast cancer Maternal Aunt        PVCs   Asthma Child        x2   Colon cancer Neg Hx    Colon polyps Neg Hx    Stomach cancer Neg Hx    Esophageal cancer Neg Hx     SOCHx:   reports that she has never smoked. She has never used smokeless tobacco. She reports current alcohol use. She reports that she does not use drugs.  ALLERGIES:  Allergies  Allergen Reactions   Cocoa Other (See Comments)    migraine   Chocolate Other (See Comments)    migraine   Crestor [Rosuvastatin]     myalgias   Erythromycin Nausea And Vomiting   Morphine And Related Nausea And Vomiting   Amoxicillin Rash   Other Rash    Tape, bandaids   Sulfamethoxazole-Trimethoprim Nausea Only    ROS: A comprehensive review of systems was negative.  HOME MEDS: Current Outpatient Medications  Medication Sig Dispense  Refill   aspirin EC 81 MG tablet Take 81 mg by mouth at bedtime.     Cholecalciferol (VITAMIN D3 GUMMIES PO) Take 2 tablets by mouth in the morning.     Cyanocobalamin (VITAMIN B-12 PO) Take 1,250 mcg by mouth in the morning.     esomeprazole (NEXIUM) 40 MG capsule Take 40 mg by mouth every evening.     ezetimibe (ZETIA) 10 MG tablet TAKE 1 TABLET ONCE DAILY. (Patient taking differently: Take 10 mg by  mouth at bedtime.) 90 tablet 3   flecainide (TAMBOCOR) 100 MG tablet TAKE 1 TABLET BY MOUTH TWICE DAILY. (Patient taking differently: Take 100 mg by mouth 2 (two) times daily.) 180 tablet 2   fluticasone (FLONASE) 50 MCG/ACT nasal spray Place 1 spray into both nostrils in the morning.     Glucosamine HCl (GLUCOSAMINE PO) Take 2,000 mg by mouth in the morning.     hydrochlorothiazide (HYDRODIURIL) 25 MG tablet Take 1 tablet (25 mg total) by mouth daily. (Patient taking differently: Take 25 mg by mouth in the morning.) 90 tablet 2   hyoscyamine (LEVSIN SL) 0.125 MG SL tablet PLACE 1-2 TABLETS UNDER TONGUE EVERY FOUR HOURS AS NEEDED FOR ABDOMINAL PAIN. (Patient taking differently: Take 0.125-0.25 mg by mouth every 4 (four) hours as needed (ABDOMINAL PAIN.).) 30 tablet 11   metFORMIN (GLUCOPHAGE) 500 MG tablet Take 500 mg by mouth in the morning and at bedtime.     Multiple Vitamins-Minerals (ADULT ONE DAILY GUMMIES PO) Take 2 tablets by mouth in the morning.     simvastatin (ZOCOR) 20 MG tablet TAKE 1 TABLET AT 6PM. (Patient taking differently: Take 20 mg by mouth at bedtime.) 90 tablet 3   VASCEPA 1 g capsule TAKE TWO CAPSULES BY MOUTH TWICE DAILY (Patient taking differently: Take 2 g by mouth 2 (two) times daily.) 120 capsule 2   nitroGLYCERIN (NITROSTAT) 0.4 MG SL tablet Place 1 tablet (0.4 mg total) under the tongue every 5 (five) minutes as needed for chest pain. MAX 3 doses (Patient taking differently: Place 0.4 mg under the tongue every 5 (five) minutes x 3 doses as needed for chest pain.) 25 tablet 3   ondansetron (ZOFRAN) 4 MG tablet Take 1 tablet (4 mg total) by mouth every 4 (four) hours as needed for nausea or vomiting. (Patient not taking: Reported on 11/27/2020) 60 tablet 1   No current facility-administered medications for this visit.    LABS/IMAGING: No results found for this or any previous visit (from the past 48 hour(s)). No results found.  VITALS: BP 126/84 (BP Location: Left  Arm, Patient Position: Sitting, Cuff Size: Normal)   Pulse 60   Ht 5\' 2"  (1.575 m)   Wt 139 lb 9.6 oz (63.3 kg)   SpO2 98%   BMI 25.53 kg/m   EXAM: General appearance: alert and no distress Neck: no carotid bruit, no JVD and thyroid not enlarged, symmetric, no tenderness/mass/nodules Lungs: clear to auscultation bilaterally Heart: regular rate and rhythm, S1, S2 normal, no murmur, click, rub or gallop Abdomen: soft, non-tender; bowel sounds normal; no masses,  no organomegaly and obese Extremities: extremities normal, atraumatic, no cyanosis or edema Pulses: 2+ and symmetric Skin: Skin color, texture, turgor normal. No rashes or lesions Neurologic: Grossly normal Psych: Pleasant  EKG: Normal sinus rhythm at 60, low voltage, QTC 436 ms-personally reviewed  ASSESSMENT: CAD - mild to moderate calcified ostial/proximal LAD stenosis on cardiac CTA (12/2017) Borderline abnormal exercise treadmill stress test PVCs-markedly improved on flecainde. Hyperlipidemia  GERD  PLAN: 1.   [name] is doing well and had recent significant weight loss.  She also underwent cholecystectomy.  She is cut back saturated fats in her diet.  She stopped her Vascepa which may explain the increase in triglycerides.  As she gets more accustomed to not having a gallbladder I suspect she could restart her Vascepa.  We will plan repeat lipid NMR in a year and plan follow-up with me at that time.  Overall she POTS good control of her PVCs on flecainide.  Pixie Casino, MD, [facility], [place] Director of the Advanced Lipid Disorders &  Cardiovascular Risk Reduction Clinic Diplomate of the American Board of Clinical Lipidology Attending Cardiologist  Direct Dial: [phone]  Fax: [phone]  Website:  [email or web address] 11/27/2020, 12:27 PM

## 2020-11-27 NOTE — Patient Instructions (Addendum)
Medication Instructions:  Your physician recommends that you continue on your current medications as directed. Please refer to the Current Medication list given to you today.  *If you need a refill on your cardiac medications before your next appointment, please call your pharmacy*   Lab Work: FASTING lab work before your next visit  If you have labs (blood work) drawn today and your tests are completely normal, you will receive your results only by: MyChart Message (if you have MyChart) OR A paper copy in the mail If you have any lab test that is abnormal or we need to change your treatment, we will call you to review the results.   Testing/Procedures: NONE   Follow-Up: At [facility], you and your health needs are our priority.  As part of our continuing mission to provide you with exceptional heart care, we have created designated Provider Care Teams.  These Care Teams include your primary Cardiologist (physician) and Advanced Practice Providers (APPs -  Physician Assistants and Nurse Practitioners) who all work together to provide you with the care you need, when you need it.  We recommend signing up for the patient portal called "MyChart".  Sign up information is provided on this After Visit Summary.  MyChart is used to connect with patients for Virtual Visits (Telemedicine).  Patients are able to view lab/test results, encounter notes, upcoming appointments, etc.  Non-urgent messages can be sent to your provider as well.   To learn more about what you can do with MyChart, go to [email or web address].    Your next appointment:   12 month(s)  The format for your next appointment:   In Person  Provider:   You may see Pixie Casino, MD or one of the following Advanced Practice Providers on your designated Care Team:   Almyra Deforest, PA-C Fabian Sharp, PA-C or  Roby Lofts, [place]   Other Instructions

## 2021-01-05 ENCOUNTER — Other Ambulatory Visit: Payer: Self-pay | Admitting: Internal Medicine

## 2021-02-09 DIAGNOSIS — Z23 Encounter for immunization: Secondary | ICD-10-CM | POA: Diagnosis not present

## 2021-02-09 DIAGNOSIS — Z1389 Encounter for screening for other disorder: Secondary | ICD-10-CM | POA: Diagnosis not present

## 2021-02-09 DIAGNOSIS — K2100 Gastro-esophageal reflux disease with esophagitis, without bleeding: Secondary | ICD-10-CM | POA: Diagnosis not present

## 2021-02-09 DIAGNOSIS — R0981 Nasal congestion: Secondary | ICD-10-CM | POA: Diagnosis not present

## 2021-02-09 DIAGNOSIS — Z0000 Encounter for general adult medical examination without abnormal findings: Secondary | ICD-10-CM | POA: Diagnosis not present

## 2021-02-09 DIAGNOSIS — E663 Overweight: Secondary | ICD-10-CM | POA: Diagnosis not present

## 2021-02-09 DIAGNOSIS — E1169 Type 2 diabetes mellitus with other specified complication: Secondary | ICD-10-CM | POA: Diagnosis not present

## 2021-02-27 DIAGNOSIS — E119 Type 2 diabetes mellitus without complications: Secondary | ICD-10-CM | POA: Diagnosis not present

## 2021-02-27 DIAGNOSIS — H524 Presbyopia: Secondary | ICD-10-CM | POA: Diagnosis not present

## 2021-02-27 DIAGNOSIS — H43813 Vitreous degeneration, bilateral: Secondary | ICD-10-CM | POA: Diagnosis not present

## 2021-02-27 DIAGNOSIS — H2513 Age-related nuclear cataract, bilateral: Secondary | ICD-10-CM | POA: Diagnosis not present

## 2021-03-14 IMAGING — US US BREAST*L* LIMITED INC AXILLA
1 series · 2 of 2 positions shown · non-contrast
Comparison: Previous exam(s).

CLINICAL DATA: Mass felt by the patient in the upper outer left
breast for the past 3 weeks. The mass is now tender to palpation.
History of right lumpectomy and radiation therapy for breast cancer
in 2663.

EXAM:
DIGITAL DIAGNOSTIC LEFT MAMMOGRAM WITH CAD AND TOMO
ULTRASOUND LEFT BREAST

[Series 1: us breast*left* limited inc axilla · 0.06mm/px · 2 of 2 slices shown]
[im 1/2]
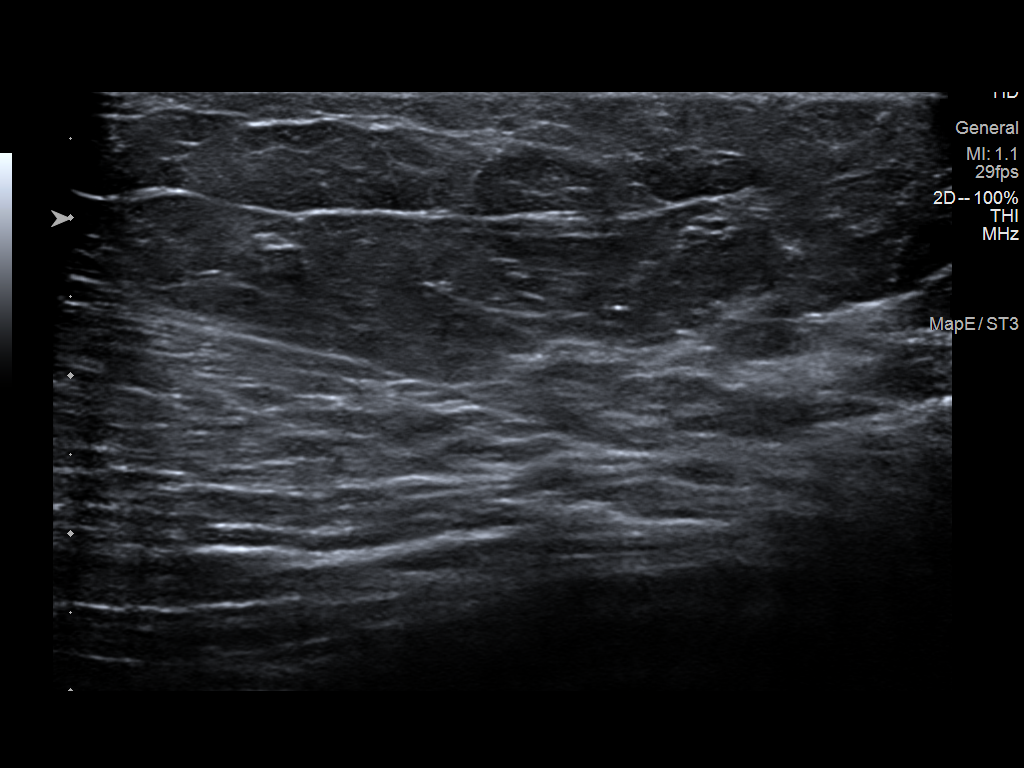
[im 2/2]
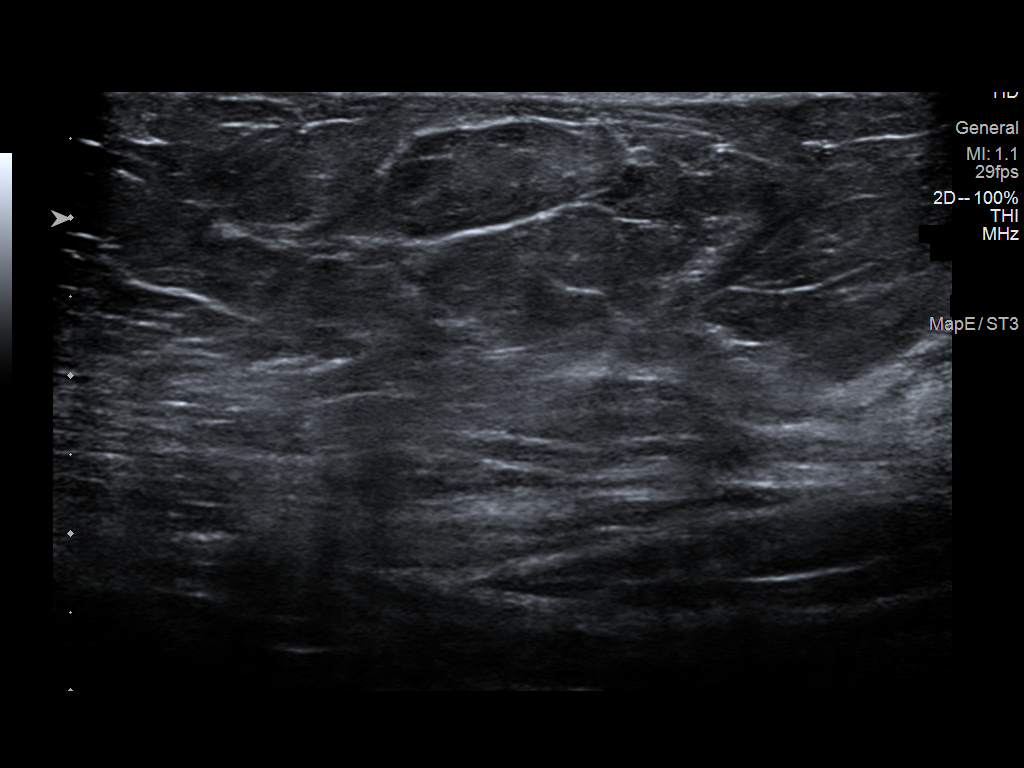

[2 of 2 positions shown; findings below may reference images not displayed]

ACR Breast Density Category c: The breast tissue is heterogeneously
dense, which may obscure small masses.
FINDINGS: Stable mammographic appearance of the left breast no mass or other
findings suspicious for malignancy. No abnormality is seen at the
location of the mass felt by the patient marked with a metallic
marker.

Mammographic images were processed with CAD.

On physical exam, there are several small palpable lobules in the
upper outer left breast at the location of the mass felt by the
patient.

Targeted ultrasound is performed, showing normal appearing breast
tissue throughout the upper outer left breast in the area of patient
concern, including normal fat lobules.
IMPRESSION: No evidence of malignancy.

RECOMMENDATION:
Bilateral screening mammogram in 3 weeks when due. That will be 1
year since mammographic evaluation of the right breast.

I have discussed the findings and recommendations with the patient.
If applicable, a reminder letter will be sent to the patient
regarding the next appointment.

BI-RADS CATEGORY  1: Negative.

## 2021-03-14 IMAGING — MG MM DIGITAL DIAGNOSTIC UNILAT*L* W/ TOMO W/ CAD
6 series · 6 of 18 positions shown · non-contrast
Comparison: Previous exam(s).

CLINICAL DATA: Mass felt by the patient in the upper outer left
breast for the past 3 weeks. The mass is now tender to palpation.
History of right lumpectomy and radiation therapy for breast cancer
in 2663.

EXAM:
DIGITAL DIAGNOSTIC LEFT MAMMOGRAM WITH CAD AND TOMO
ULTRASOUND LEFT BREAST

[L TAN synth-2D]
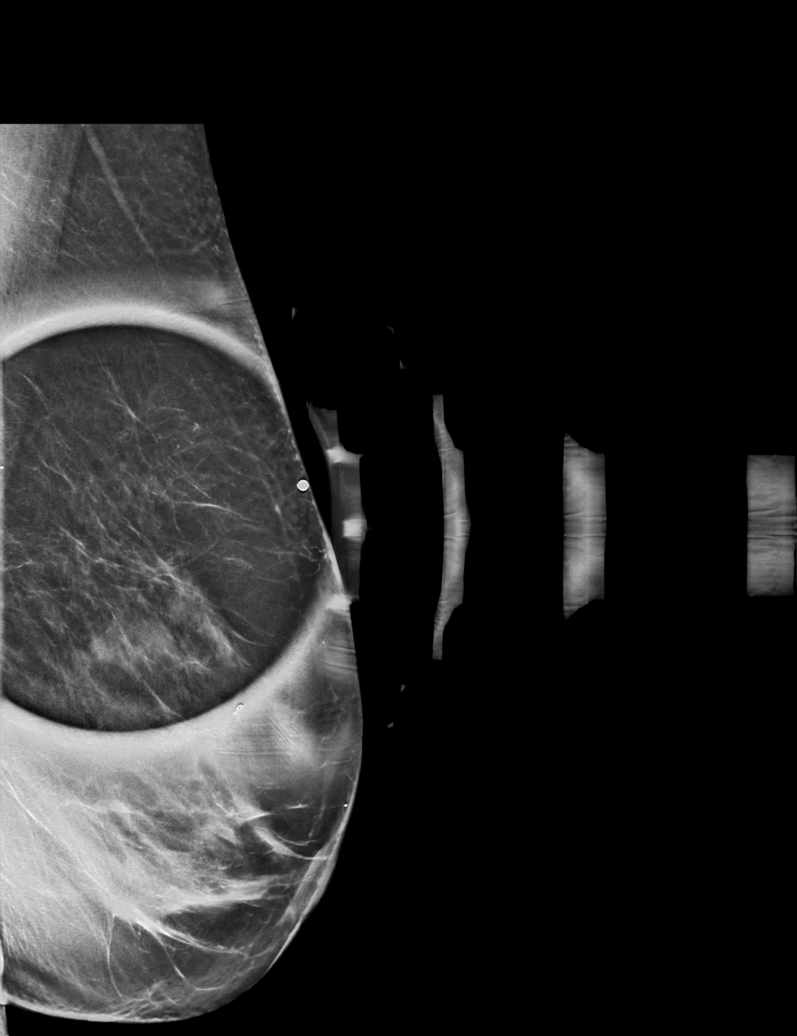

[L MLO synth-2D]
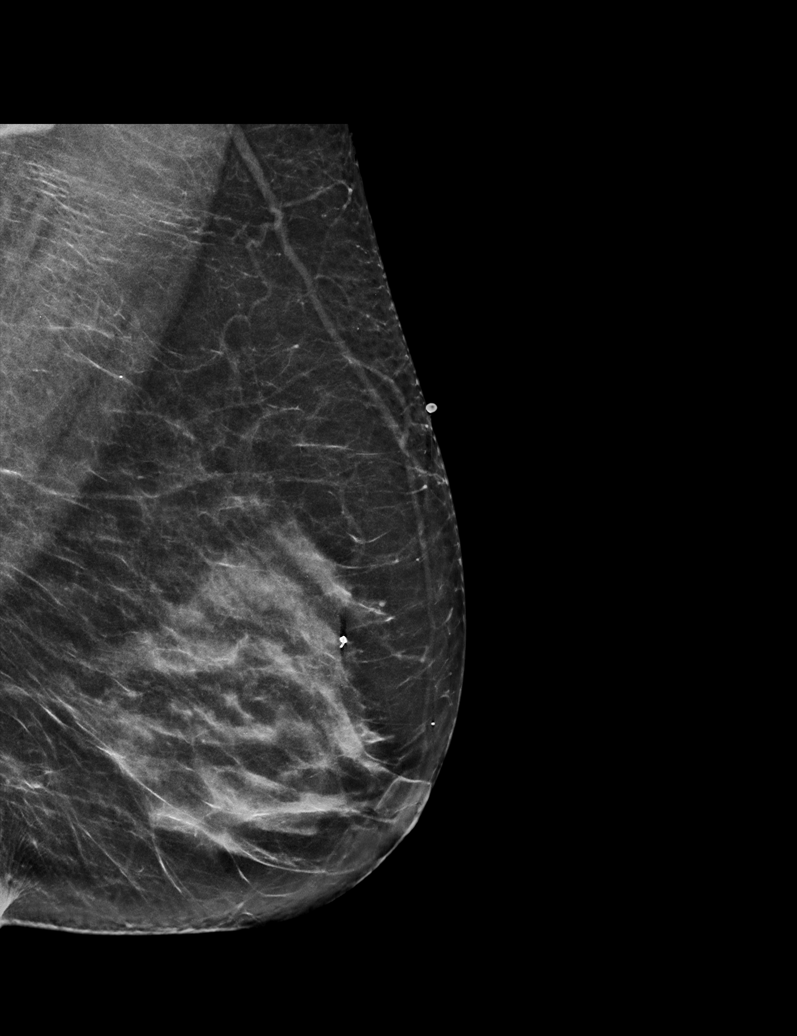

[L CC synth-2D]
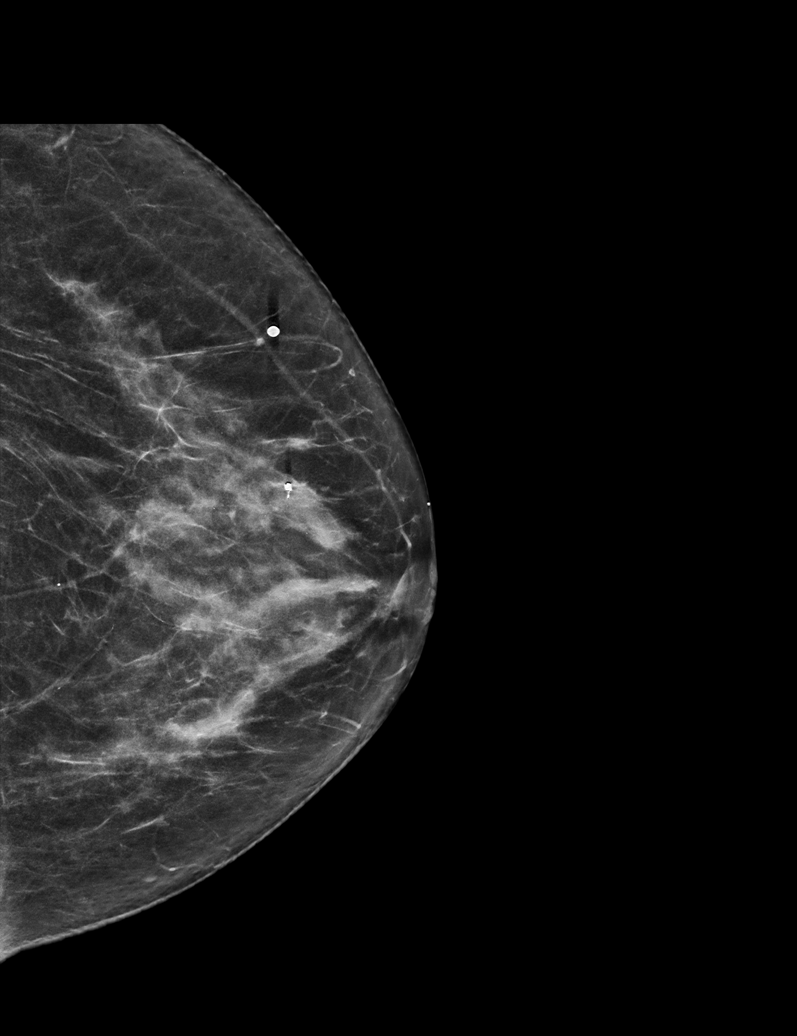

[L TAN tomo · tomo slice 30/59.0]
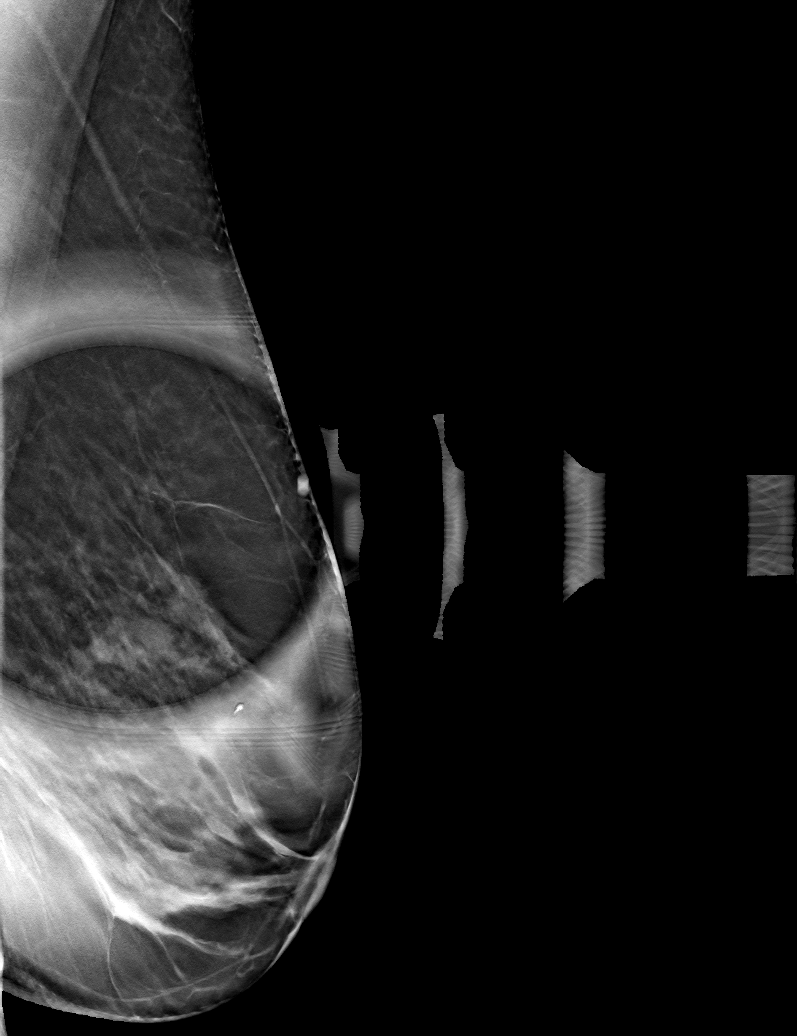

[L CC tomo · tomo slice 31/61.0]
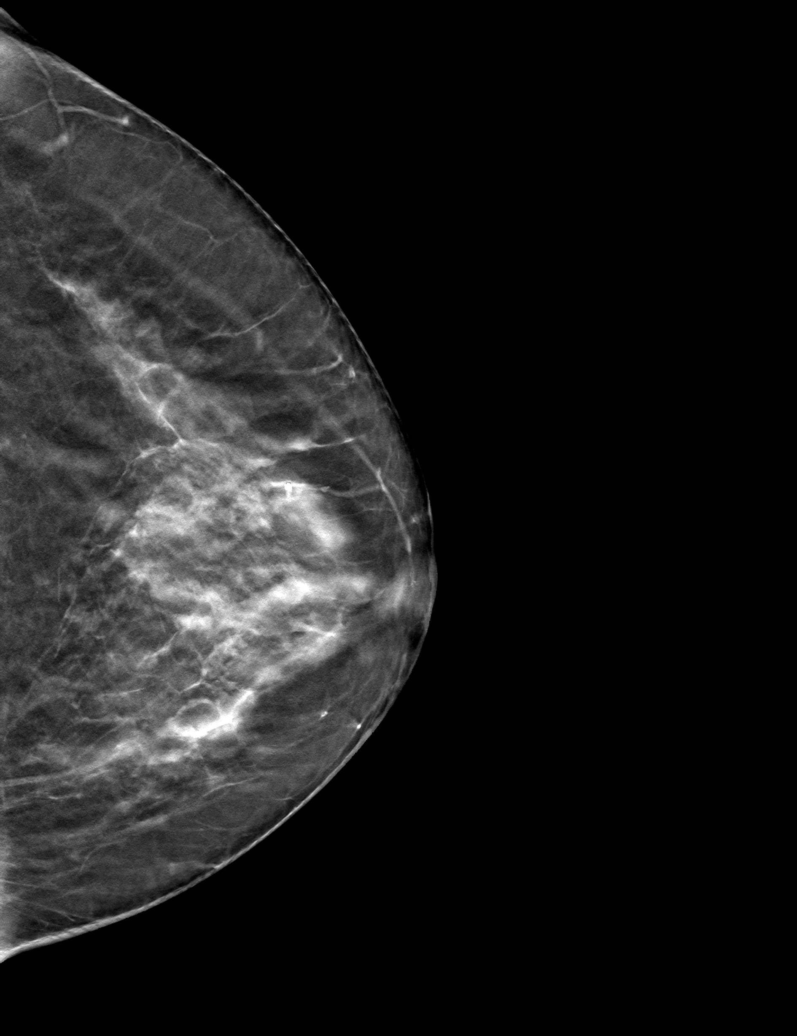

[L MLO tomo · tomo slice 32/63.0]
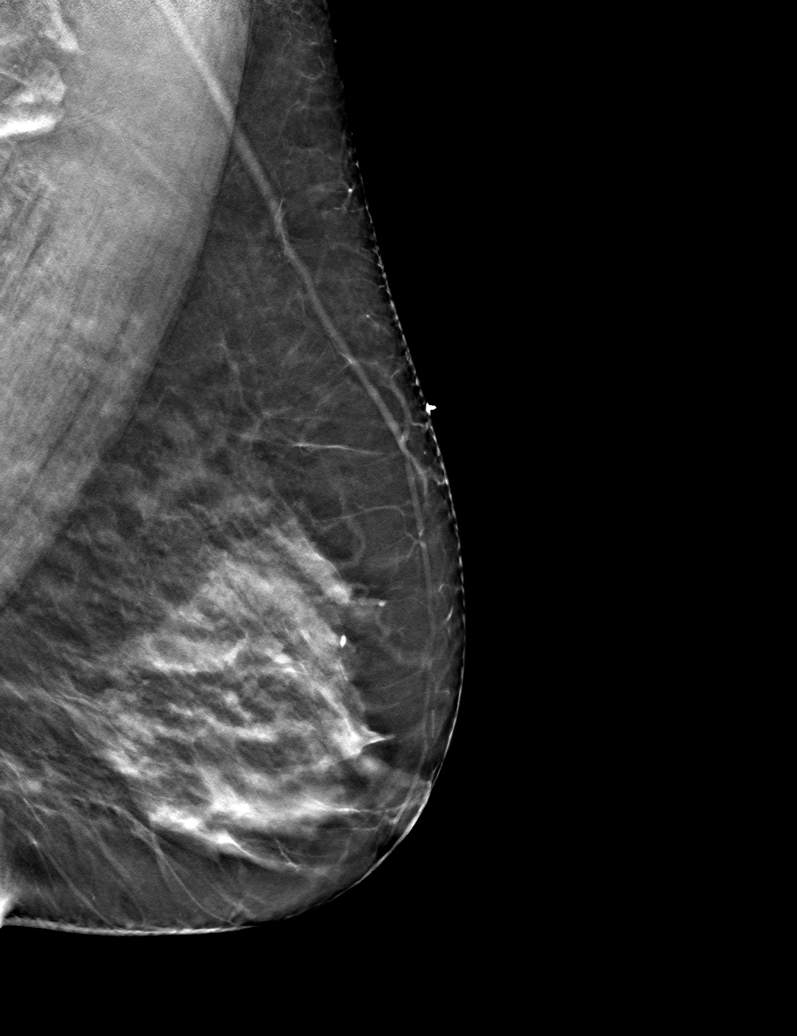

[6 of 18 positions shown; findings below may reference images not displayed]

ACR Breast Density Category c: The breast tissue is heterogeneously
dense, which may obscure small masses.
FINDINGS: Stable mammographic appearance of the left breast no mass or other
findings suspicious for malignancy. No abnormality is seen at the
location of the mass felt by the patient marked with a metallic
marker.

Mammographic images were processed with CAD.

On physical exam, there are several small palpable lobules in the
upper outer left breast at the location of the mass felt by the
patient.

Targeted ultrasound is performed, showing normal appearing breast
tissue throughout the upper outer left breast in the area of patient
concern, including normal fat lobules.
IMPRESSION: No evidence of malignancy.

RECOMMENDATION:
Bilateral screening mammogram in 3 weeks when due. That will be 1
year since mammographic evaluation of the right breast.

I have discussed the findings and recommendations with the patient.
If applicable, a reminder letter will be sent to the patient
regarding the next appointment.

BI-RADS CATEGORY  1: Negative.

## 2021-03-18 ENCOUNTER — Other Ambulatory Visit: Payer: Self-pay | Admitting: Internal Medicine

## 2021-04-16 DIAGNOSIS — H25811 Combined forms of age-related cataract, right eye: Secondary | ICD-10-CM | POA: Diagnosis not present

## 2021-04-16 DIAGNOSIS — H2511 Age-related nuclear cataract, right eye: Secondary | ICD-10-CM | POA: Diagnosis not present

## 2021-04-16 DIAGNOSIS — H25011 Cortical age-related cataract, right eye: Secondary | ICD-10-CM | POA: Diagnosis not present

## 2021-04-22 DIAGNOSIS — Z1231 Encounter for screening mammogram for malignant neoplasm of breast: Secondary | ICD-10-CM | POA: Diagnosis not present

## 2021-04-30 ENCOUNTER — Ambulatory Visit
Admission: RE | Admit: 2021-04-30 | Discharge: 2021-04-30 | Disposition: A | Discharge status: Home / Self Care | Payer: Medicare PPO | Source: Ambulatory Visit | Attending: Obstetrics and Gynecology | Admitting: Obstetrics and Gynecology

## 2021-04-30 ENCOUNTER — Other Ambulatory Visit: Payer: Self-pay

## 2021-04-30 DIAGNOSIS — M8589 Other specified disorders of bone density and structure, multiple sites: Secondary | ICD-10-CM | POA: Diagnosis not present

## 2021-04-30 DIAGNOSIS — Z780 Asymptomatic menopausal state: Secondary | ICD-10-CM | POA: Diagnosis not present

## 2021-04-30 DIAGNOSIS — E2839 Other primary ovarian failure: Secondary | ICD-10-CM

## 2021-05-02 ENCOUNTER — Encounter: Payer: Self-pay | Admitting: Internal Medicine

## 2021-05-11 ENCOUNTER — Other Ambulatory Visit: Payer: Self-pay | Admitting: Internal Medicine

## 2021-05-14 DIAGNOSIS — H25012 Cortical age-related cataract, left eye: Secondary | ICD-10-CM | POA: Diagnosis not present

## 2021-05-14 DIAGNOSIS — H25812 Combined forms of age-related cataract, left eye: Secondary | ICD-10-CM | POA: Diagnosis not present

## 2021-05-14 DIAGNOSIS — H2512 Age-related nuclear cataract, left eye: Secondary | ICD-10-CM | POA: Diagnosis not present

## 2021-05-25 DIAGNOSIS — M8580 Other specified disorders of bone density and structure, unspecified site: Secondary | ICD-10-CM | POA: Diagnosis not present

## 2021-05-25 DIAGNOSIS — D171 Benign lipomatous neoplasm of skin and subcutaneous tissue of trunk: Secondary | ICD-10-CM | POA: Diagnosis not present

## 2021-06-02 ENCOUNTER — Other Ambulatory Visit: Payer: Self-pay | Admitting: Internal Medicine

## 2021-06-17 ENCOUNTER — Other Ambulatory Visit: Payer: Self-pay | Admitting: Internal Medicine

## 2021-06-23 DIAGNOSIS — M25562 Pain in left knee: Secondary | ICD-10-CM | POA: Diagnosis not present

## 2021-06-23 DIAGNOSIS — M1712 Unilateral primary osteoarthritis, left knee: Secondary | ICD-10-CM | POA: Diagnosis not present

## 2021-07-18 ENCOUNTER — Other Ambulatory Visit: Payer: Self-pay

## 2021-07-18 ENCOUNTER — Encounter (HOSPITAL_BASED_OUTPATIENT_CLINIC_OR_DEPARTMENT_OTHER): Payer: Self-pay | Admitting: Emergency Medicine

## 2021-07-18 ENCOUNTER — Emergency Department (HOSPITAL_BASED_OUTPATIENT_CLINIC_OR_DEPARTMENT_OTHER): Payer: Medicare PPO

## 2021-07-18 ENCOUNTER — Emergency Department (HOSPITAL_BASED_OUTPATIENT_CLINIC_OR_DEPARTMENT_OTHER)
Admission: EM | Admit: 2021-07-18 | Discharge: 2021-07-18 | Disposition: A | Discharge status: Home / Self Care | Payer: Medicare PPO | Attending: Emergency Medicine | Admitting: Emergency Medicine

## 2021-07-18 DIAGNOSIS — R197 Diarrhea, unspecified: Secondary | ICD-10-CM | POA: Insufficient documentation

## 2021-07-18 DIAGNOSIS — Z7984 Long term (current) use of oral hypoglycemic drugs: Secondary | ICD-10-CM | POA: Insufficient documentation

## 2021-07-18 DIAGNOSIS — R112 Nausea with vomiting, unspecified: Secondary | ICD-10-CM | POA: Insufficient documentation

## 2021-07-18 DIAGNOSIS — Z853 Personal history of malignant neoplasm of breast: Secondary | ICD-10-CM | POA: Insufficient documentation

## 2021-07-18 DIAGNOSIS — R1084 Generalized abdominal pain: Secondary | ICD-10-CM | POA: Diagnosis not present

## 2021-07-18 DIAGNOSIS — I700 Atherosclerosis of aorta: Secondary | ICD-10-CM | POA: Diagnosis not present

## 2021-07-18 DIAGNOSIS — Z7982 Long term (current) use of aspirin: Secondary | ICD-10-CM | POA: Insufficient documentation

## 2021-07-18 DIAGNOSIS — E119 Type 2 diabetes mellitus without complications: Secondary | ICD-10-CM | POA: Diagnosis not present

## 2021-07-18 DIAGNOSIS — R1031 Right lower quadrant pain: Secondary | ICD-10-CM | POA: Diagnosis not present

## 2021-07-18 LAB — LIPASE, BLOOD: Lipase: 23 U/L (ref 11–51)

## 2021-07-18 LAB — CBC
HCT: 45.3 % (ref 36.0–46.0)
Hemoglobin: 15.5 g/dL — ABNORMAL HIGH (ref 12.0–15.0)
MCH: 31.7 pg (ref 26.0–34.0)
MCHC: 34.2 g/dL (ref 30.0–36.0)
MCV: 92.6 fL (ref 80.0–100.0)
Platelets: 333 10*3/uL (ref 150–400)
RBC: 4.89 MIL/uL (ref 3.87–5.11)
RDW: 13.1 % (ref 11.5–15.5)
WBC: 17 10*3/uL — ABNORMAL HIGH (ref 4.0–10.5)
nRBC: 0 % (ref 0.0–0.2)

## 2021-07-18 LAB — COMPREHENSIVE METABOLIC PANEL
ALT: 26 U/L (ref 0–44)
AST: 16 U/L (ref 15–41)
Albumin: 4.7 g/dL (ref 3.5–5.0)
Alkaline Phosphatase: 64 U/L (ref 38–126)
Anion gap: 13 (ref 5–15)
BUN: 20 mg/dL (ref 8–23)
CO2: 25 mmol/L (ref 22–32)
Calcium: 9.9 mg/dL (ref 8.9–10.3)
Chloride: 100 mmol/L (ref 98–111)
Creatinine, Ser: 0.72 mg/dL (ref 0.44–1.00)
GFR, Estimated: >60 mL/min (ref >60)
Glucose, Bld: 143 mg/dL — ABNORMAL HIGH (ref 70–99)
Potassium: 3.7 mmol/L (ref 3.5–5.1)
Sodium: 138 mmol/L (ref 135–145)
Total Bilirubin: 0.6 mg/dL (ref 0.3–1.2)
Total Protein: 7.4 g/dL (ref 6.5–8.1)

## 2021-07-18 LAB — URINALYSIS, ROUTINE W REFLEX MICROSCOPIC
Bilirubin Urine: NEGATIVE
Glucose, UA: NEGATIVE mg/dL
Hgb urine dipstick: NEGATIVE
Ketones, ur: NEGATIVE mg/dL
Nitrite: NEGATIVE
Specific Gravity, Urine: 1.028 (ref 1.005–1.030)
pH: 6.5 (ref 5.0–8.0)

## 2021-07-18 MED ORDER — ONDANSETRON 4 MG PO TBDP: 4.0000 mg | ORAL_TABLET | Freq: Three times a day (TID) | ORAL | 0 refills | Status: DC | PRN

## 2021-07-18 MED ORDER — SODIUM CHLORIDE 0.9 % IV BOLUS
1000.0000 mL | Freq: Once | INTRAVENOUS | Status: AC
  Administered 2021-07-18: 1000 mL via INTRAVENOUS

## 2021-07-18 MED ORDER — ONDANSETRON HCL 4 MG/2ML IJ SOLN
4.0000 mg | Freq: Once | INTRAMUSCULAR | Status: AC | PRN
  Administered 2021-07-18: 4 mg via INTRAVENOUS
  Filled 2021-07-18: qty 2

## 2021-07-18 MED ORDER — ONDANSETRON 4 MG PO TBDP
4.0000 mg | ORAL_TABLET | Freq: Once | ORAL | Status: AC
  Administered 2021-07-18: 4 mg via ORAL
  Filled 2021-07-18: qty 1

## 2021-07-18 MED ORDER — IOHEXOL 300 MG/ML  SOLN
100.0000 mL | Freq: Once | INTRAMUSCULAR | Status: AC | PRN
  Administered 2021-07-18: 100 mL via INTRAVENOUS

## 2021-07-18 MED ORDER — SODIUM CHLORIDE 0.9 % IV BOLUS
500.0000 mL | Freq: Once | INTRAVENOUS | Status: AC
  Administered 2021-07-18: 500 mL via INTRAVENOUS

## 2021-07-18 NOTE — ED Notes (Signed)
Given crackers and ginger ale

## 2021-07-18 NOTE — ED Notes (Signed)
Patient transported to CT 

## 2021-07-18 NOTE — ED Provider Notes (Signed)
[facility] EMERGENCY DEPT Provider Note   CSN: [id number] Arrival date & time: 07/18/21  1418     History  No chief complaint on file.   [name] is a 66 y.o. female.  HPI Patient presents with abdominal pain.  Nausea vomiting and diarrhea also.  Has had episodes on and off for the last few weeks but states last couple days feeling worse.  Now pain in the lower abdomen.  Had previous cholecystectomy and states that pain was not like this.  Not having fevers.  States she was in the ER yesterday with her daughter, who had a colitis and was admitted to the hospital.  Also previous appendectomy.   Past Medical History:  Diagnosis Date   ANXIETY 11/07/2009   Qualifier: Diagnosis of  By: Trellis Paganini PA-c, Amy S - came after her diagnosis of breast cancer   Breast CA ([place])    right - radiation & surgery   Diabetes ([place])    Type II, diet control   DIVERTICULOSIS, COLON 06/26/2004   Qualifier: Diagnosis of  By: Arsenio Loader RN, Carissa     DM2 (diabetes mellitus, type 2) ([place]) 11/07/2009   Qualifier: Diagnosis of  By: Shane Crutch, Amy S    Family history of heart disease    Fatty liver    Gallstone    GASTRIC POLYP 03/30/2007   Qualifier: Diagnosis of  By: Arsenio Loader RN, Carissa     Gastroparesis    mild   GERD 11/07/2009   Qualifier: Diagnosis of  By: Shane Crutch, Amy S    History of nuclear stress test 12/2011   bruce protocol; no evidence of ischemia, EF 79%, normal pattern of perfusion   Hyperchloremia    Hyperlipidemia    HYPERTHYROIDISM 11/07/2009   Qualifier: Diagnosis of  By: Trellis Paganini PA-c, Amy S    Irritable bowel syndrome 08/04/2007   Qualifier: Diagnosis of  By: Arsenio Loader RN, Carissa     MVP (mitral valve prolapse)    last echo 07/1997 - normal LV size/function, mild MVP, mild MR   OVARIAN CYST 08/04/2007   Qualifier: Diagnosis of  By: Arsenio Loader RN, Ramond Craver     Personal history of radiation therapy    PVC (premature ventricular contraction)    Vitamin D  deficiency     Home Medications Prior to Admission medications   Medication Sig Start Date End Date Taking? Authorizing Provider  ondansetron (ZOFRAN-ODT) 4 MG disintegrating tablet Take 1 tablet (4 mg total) by mouth every 8 (eight) hours as needed for nausea or vomiting. 07/18/21  Yes Davonna Belling, MD  aspirin EC 81 MG tablet Take 81 mg by mouth at bedtime.    [provider]  Cholecalciferol (VITAMIN D3 GUMMIES PO) Take 2 tablets by mouth in the morning.    [provider]  Cyanocobalamin (VITAMIN B-12 PO) Take 1,250 mcg by mouth in the morning.    [provider]  esomeprazole (NEXIUM) 40 MG capsule Take 1 capsule (40 mg total) by mouth in the morning and at bedtime. 06/17/21   Irene Shipper, MD  ezetimibe (ZETIA) 10 MG tablet TAKE 1 TABLET ONCE DAILY. Patient taking differently: Take 10 mg by mouth at bedtime. 08/25/20   Hilty, Nadean Corwin, MD  flecainide (TAMBOCOR) 100 MG tablet Take 1 tablet (100 mg total) by mouth 2 (two) times daily. 05/12/21   Hilty, Nadean Corwin, MD  fluticasone (FLONASE) 50 MCG/ACT nasal spray Place 1 spray into both nostrils in the morning.    [provider]  Glucosamine HCl (GLUCOSAMINE PO) Take 2,000 mg by mouth in the morning.    [provider]  hydrochlorothiazide (HYDRODIURIL) 25 MG tablet TAKE 1 TABLET BY MOUTH DAILY. 01/06/21   Hilty, Nadean Corwin, MD  hyoscyamine (LEVSIN SL) 0.125 MG SL tablet PLACE 1-2 TABLETS UNDER TONGUE EVERY FOUR HOURS AS NEEDED FOR ABDOMINAL PAIN. Patient taking differently: Take 0.125-0.25 mg by mouth every 4 (four) hours as needed (ABDOMINAL PAIN.). 10/14/17   Irene Shipper, MD  metFORMIN (GLUCOPHAGE) 500 MG tablet Take 500 mg by mouth in the morning and at bedtime. 08/10/19   [provider]  Multiple Vitamins-Minerals (ADULT ONE DAILY GUMMIES PO) Take 2 tablets by mouth in the morning.    [provider]  nitroGLYCERIN (NITROSTAT) 0.4 MG SL tablet Place 1 tablet (0.4 mg total)  under the tongue every 5 (five) minutes as needed for chest pain. MAX 3 doses Patient taking differently: Place 0.4 mg under the tongue every 5 (five) minutes x 3 doses as needed for chest pain. 01/12/18 11/21/19  Pixie Casino, MD  simvastatin (ZOCOR) 20 MG tablet TAKE 1 TABLET AT 6PM. 03/18/21   Hilty, Nadean Corwin, MD  VASCEPA 1 g capsule TAKE TWO CAPSULES BY MOUTH TWICE DAILY 06/02/21   Hilty, Nadean Corwin, MD      Allergies    Cocoa, Chocolate, Crestor [rosuvastatin], Erythromycin, Morphine and related, Amoxicillin, Other, and Sulfamethoxazole-trimethoprim    Review of Systems   Review of Systems  Constitutional:  Positive for appetite change.  Respiratory:  Negative for shortness of breath.   Gastrointestinal:  Positive for abdominal pain, diarrhea, nausea and vomiting.  Musculoskeletal:  Positive for back pain.  Neurological:  Negative for weakness.   Physical Exam Updated Vital Signs BP 127/72 (BP Location: Right Arm)    Pulse 91    Temp 98.6 F (37 C) (Oral)    Resp 17    Ht 5\' 2"  (1.575 m)    Wt 63.5 kg    SpO2 100%    BMI 25.61 kg/m  Physical Exam Vitals and nursing note reviewed.  Abdominal:     Comments: Diffuse tenderness, worse in the right lower quadrant.  No distention.  Musculoskeletal:        General: No tenderness.  Skin:    General: Skin is warm.     Capillary Refill: Capillary refill takes less than 2 seconds.  Neurological:     Mental Status: She is alert and oriented to person, place, and time.    ED Results / Procedures / Treatments   Labs (all labs ordered are listed, but only abnormal results are displayed) Labs Reviewed  COMPREHENSIVE METABOLIC PANEL - Abnormal; Notable for the following components:      Result Value   Glucose, Bld 143 (*)    All other components within normal limits  CBC - Abnormal; Notable for the following components:   WBC 17.0 (*)    Hemoglobin 15.5 (*)    All other components within normal limits  URINALYSIS, ROUTINE W REFLEX  MICROSCOPIC - Abnormal; Notable for the following components:   Protein, ur TRACE (*)    Leukocytes,Ua MODERATE (*)    Bacteria, UA RARE (*)    All other components within normal limits  LIPASE, BLOOD    EKG None  Radiology CT ABDOMEN PELVIS W CONTRAST  Result Date: 07/18/2021 CLINICAL DATA:  Right lower quadrant pain EXAM: CT ABDOMEN AND PELVIS WITH CONTRAST TECHNIQUE: Multidetector CT imaging of the abdomen and  pelvis was performed using the standard protocol following bolus administration of intravenous contrast. RADIATION DOSE REDUCTION: This exam was performed according to the departmental dose-optimization program which includes automated exposure control, adjustment of the mA and/or kV according to patient size and/or use of iterative reconstruction technique. CONTRAST:  168mL OMNIPAQUE IOHEXOL 300 MG/ML  SOLN COMPARISON:  None. FINDINGS: Lower chest: No acute abnormality. Hepatobiliary: No focal liver abnormality is seen. Prior cholecystectomy. No biliary ductal dilatation. Pancreas: Unremarkable. No pancreatic ductal dilatation or surrounding inflammatory changes. Spleen: Normal in size without focal abnormality. Adrenals/Urinary Tract: Adrenal glands are unremarkable. Kidneys are normal, without renal calculi, focal lesion, or hydronephrosis. Bladder is unremarkable. Stomach/Bowel: Stomach is within normal limits. No evidence of bowel wall thickening, distention, or inflammatory changes. Vascular/Lymphatic: Aortic atherosclerosis. No enlarged abdominal or pelvic lymph nodes. Reproductive: Status post hysterectomy. No adnexal masses. Other: No abdominal wall hernia or abnormality. No abdominopelvic ascites. Musculoskeletal: No acute osseous abnormality. No aggressive osseous lesion. Mild osteoarthritis of bilateral SI joints. Degenerative disease with disc height loss and endplate sclerosis at J8-5 with bilateral facet arthropathy and foraminal stenosis. Moderate bilateral facet arthropathy at  L5-S1. IMPRESSION: 1. No acute abdominal or pelvic pathology. 2. Aortic Atherosclerosis (ICD10-I70.0). 3. Lower lumbar spine spondylosis. Electronically Signed   By: Kathreen Devoid M.D.   On: 07/18/2021 16:02    Procedures Procedures    Medications Ordered in ED Medications  ondansetron (ZOFRAN) injection 4 mg (4 mg Intravenous Given 07/18/21 1455)  sodium chloride 0.9 % bolus 1,000 mL (0 mLs Intravenous Stopped 07/18/21 2057)  iohexol (OMNIPAQUE) 300 MG/ML solution 100 mL (100 mLs Intravenous Contrast Given 07/18/21 1545)  sodium chloride 0.9 % bolus 500 mL (0 mLs Intravenous Stopped 07/18/21 2057)  ondansetron (ZOFRAN-ODT) disintegrating tablet 4 mg (4 mg Oral Given 07/18/21 2100)    ED Course/ Medical Decision Making/ A&P                           Medical Decision Making Amount and/or Complexity of Data Reviewed Labs: ordered. Radiology: ordered.  Risk Prescription drug management.   Patient presents abdominal pain nausea vomiting diarrhea.  Lower abdominal tenderness appears worse.  Previous appendectomy appendectomy.  Previous cholecystectomy.  Initial white count elevated at 17.  CT scan done and independently interpreted by me.  Reassuring with no direct cause of the pain.  Feels better after treatment.  Does tolerate orals.  Some cramping fluid bolus given.  Will discharge home.  Potentially gastroenteritis or other cause of the vomiting but no obstruction seen.  Patient's daughter had a colitis and was admitted but this did not show up on CT scan here.  Discharge home with symptomatic treatment I reviewed previous GI and cardiology notes.        Final Clinical Impression(s) / ED Diagnoses Final diagnoses:  Nausea vomiting and diarrhea  Generalized abdominal pain    Rx / DC Orders ED Discharge Orders          Ordered    ondansetron (ZOFRAN-ODT) 4 MG disintegrating tablet  Every 8 hours PRN        07/18/21 2042              Davonna Belling, MD 07/18/21 2356

## 2021-07-18 NOTE — ED Triage Notes (Signed)
Pt reports constipation then diarrhea a few weeks ago. Pt unable to keep anything down. Endorses back pain, leg pain, generalized abd pain.

## 2021-08-06 DIAGNOSIS — E782 Mixed hyperlipidemia: Secondary | ICD-10-CM | POA: Diagnosis not present

## 2021-08-06 DIAGNOSIS — E1169 Type 2 diabetes mellitus with other specified complication: Secondary | ICD-10-CM | POA: Diagnosis not present

## 2021-08-14 DIAGNOSIS — K2100 Gastro-esophageal reflux disease with esophagitis, without bleeding: Secondary | ICD-10-CM | POA: Diagnosis not present

## 2021-08-14 DIAGNOSIS — E663 Overweight: Secondary | ICD-10-CM | POA: Diagnosis not present

## 2021-08-14 DIAGNOSIS — E1169 Type 2 diabetes mellitus with other specified complication: Secondary | ICD-10-CM | POA: Diagnosis not present

## 2021-08-31 ENCOUNTER — Other Ambulatory Visit: Payer: Self-pay | Admitting: Internal Medicine

## 2021-09-22 DIAGNOSIS — M25562 Pain in left knee: Secondary | ICD-10-CM | POA: Diagnosis not present

## 2021-09-30 ENCOUNTER — Encounter: Payer: Self-pay | Admitting: Internal Medicine

## 2021-09-30 NOTE — Telephone Encounter (Signed)
Spoke with patient of Dr. Debara Pickett - she has concerns about vascepa SE ? ?She had gallbladder out last June, stopped vascepa around this time.  ?She reports her "system was trying to get used to gallbladder being gone" - concerns about the fats in the medication  ?She tried to resume vascepa, started off with 1 BID and then built to up 2 caps BID ?She reports gastric issues with vascepa and also dizziness/lightheadedness, nausea when she takes the medication. ?She last took a dose 36 hours ago and symptoms are improving  ?She likes how well vascepa controls TGs and CV benefits ? ?She would like advice on how to proceed with medication  ? ? ?

## 2021-10-12 ENCOUNTER — Other Ambulatory Visit: Payer: Self-pay | Admitting: Internal Medicine

## 2021-10-26 ENCOUNTER — Encounter: Payer: Self-pay | Admitting: Internal Medicine

## 2021-10-26 ENCOUNTER — Telehealth: Payer: Self-pay | Admitting: Internal Medicine

## 2021-10-26 NOTE — Telephone Encounter (Signed)
Pt states she has been having problems with abdominal pain and loose bowels with mucous. Also reports cramping. Reports she feels better when she doesn't eat. Pt requesting to be seen. Pt scheduled to see Dr. Henrene Pastor 10/28/21 at 8:40am. Pt aware of appt. ?

## 2021-10-26 NOTE — Telephone Encounter (Signed)
Patient called and asked if you would give her a call regarding some issues she's having and would like recommendations.  She would not elaborate on what is going on with her.  She said you could call either her house phone or her call phone.  Thank you. ?

## 2021-10-28 ENCOUNTER — Ambulatory Visit: Payer: Medicare PPO | Admitting: Internal Medicine

## 2021-10-28 ENCOUNTER — Other Ambulatory Visit (HOSPITAL_COMMUNITY): Payer: Self-pay

## 2021-10-28 ENCOUNTER — Encounter: Payer: Self-pay | Admitting: Internal Medicine

## 2021-10-28 VITALS — BP 120/70 | HR 66 | Ht 61.0 in | Wt 147.1 lb

## 2021-10-28 DIAGNOSIS — R110 Nausea: Secondary | ICD-10-CM

## 2021-10-28 DIAGNOSIS — K219 Gastro-esophageal reflux disease without esophagitis: Secondary | ICD-10-CM

## 2021-10-28 DIAGNOSIS — R109 Unspecified abdominal pain: Secondary | ICD-10-CM | POA: Diagnosis not present

## 2021-10-28 DIAGNOSIS — R197 Diarrhea, unspecified: Secondary | ICD-10-CM

## 2021-10-28 MED ORDER — ONDANSETRON 4 MG PO TBDP: 4.0000 mg | ORAL_TABLET | ORAL | 2 refills | Status: DC | PRN

## 2021-10-28 MED ORDER — DICYCLOMINE HCL 20 MG PO TABS: ORAL_TABLET | ORAL | 2 refills | Status: DC

## 2021-10-28 MED ORDER — METRONIDAZOLE 250 MG PO TABS: 250.0000 mg | ORAL_TABLET | Freq: Three times a day (TID) | ORAL | 0 refills | Status: DC

## 2021-10-28 MED ORDER — PLENVU 140 G PO SOLR: 1.0000 | Freq: Once | ORAL | 0 refills | Status: AC

## 2021-10-28 NOTE — Progress Notes (Signed)
HISTORY OF PRESENT ILLNESS: ? ?[name] is a 66 y.o. female with past medical history as listed below who presents today regarding problems with lower abdominal cramping followed by urgency and loose stools.  The patient was last seen in this office September 02, 2020 regarding episodic upper abdominal pain with nausea and vomiting felt secondary to gallstones for which she underwent subsequent cholecystectomy.  She did well thereafter with resolution of upper abdominal complaints. ? ?Patient's current history is that she was in her usual state of good health until July 18, 2021 when she presented to the emergency room with severe abdominal pain, nausea, vomiting, diarrhea.  She had lower abdominal tenderness on physical examination.  She had an elevated white blood cell count of 17,000.  CT scan was unremarkable.  She was treated with symptomatic therapies (IV Zofran) and discharged home.  Since that time she reports problems with significant lower abdominal cramping followed by urgency and multiple loose stools.  This often occurs in the morning after consuming coffee.  However, it can occur other times a day and has also occurred at night waking her from sleep.  There has been no bleeding or weight loss.  Her last colonoscopy was performed April 2014.  This revealed moderate left-sided diverticulosis but was otherwise normal including evaluation of the terminal ileum.  She denies any change in medications coincident with her symptoms.  She has been on a stable dose of metformin for some time.  She does tell me that after her acute illness in February her husband and grandchildren suffered a similar ailment.  They felt they may have had a norovirus.  She is accompanied today by her husband.  Hospital emergency room encounter, laboratories, x-rays personally reviewed.  She has GERD and is on PPI.  No active GERD symptoms.  She does report nausea and might like to have on hand and antiemetic ? ?REVIEW OF  SYSTEMS: ? ?All non-GI ROS negative unless otherwise stated in the HPI except for arthritis, back pain, fatigue, heart rhythm change, headaches, ankle swelling, allergies ? ?Past Medical History:  ?Diagnosis Date  ? ANXIETY 11/07/2009  ? Qualifier: Diagnosis of  By: Shane Crutch, Amy S - came after her diagnosis of breast cancer  ? Breast CA ([place])   ? right - radiation & surgery  ? Diabetes ([place])   ? Type II, diet control  ? DIVERTICULOSIS, COLON 06/26/2004  ? Qualifier: Diagnosis of  By: Arsenio Loader RN, Ramond Craver    ? DM2 (diabetes mellitus, type 2) ([place]) 11/07/2009  ? Qualifier: Diagnosis of  By: Shane Crutch, Amy S   ? Family history of heart disease   ? Fatty liver   ? Gallstone   ? GASTRIC POLYP 03/30/2007  ? Qualifier: Diagnosis of  By: Arsenio Loader RN, Ramond Craver    ? Gastroparesis   ? mild  ? GERD 11/07/2009  ? Qualifier: Diagnosis of  By: Shane Crutch, Amy S   ? History of nuclear stress test 12/2011  ? bruce protocol; no evidence of ischemia, EF 79%, normal pattern of perfusion  ? Hyperchloremia   ? Hyperlipidemia   ? HYPERTHYROIDISM 11/07/2009  ? Qualifier: Diagnosis of  By: Shane Crutch, Amy S   ? Irritable bowel syndrome 08/04/2007  ? Qualifier: Diagnosis of  By: Arsenio Loader RN, Ramond Craver    ? MVP (mitral valve prolapse)   ? last echo 07/1997 - normal LV size/function, mild MVP, mild MR  ? Osteopenia   ? OVARIAN CYST 08/04/2007  ? Qualifier: Diagnosis  of  By: Arsenio Loader RN, Ramond Craver    ? Personal history of radiation therapy   ? PVC (premature ventricular contraction)   ? Vitamin D deficiency   ? ? ?Past Surgical History:  ?Procedure Laterality Date  ? ABDOMINAL HYSTERECTOMY  03/2003  ? APPENDECTOMY  01/2002  ? BREAST LUMPECTOMY Right 2006  ? x2  ? CARDIAC CATHETERIZATION  10/13/2009  ? no significant fixed obstructive CAD, normral ramus intermediate/circumflex/RCA; normal LV function (Dr. Corky Downs)  ? CHOLECYSTECTOMY N/A 11/18/2020  ? Procedure: LAPAROSCOPIC CHOLECYSTECTOMY;  Surgeon: Ralene Ok, MD;  Location: [facility];   Service: General;  Laterality: N/A;  ? LAPAROSCOPIC OOPHERECTOMY    ? TONSILLECTOMY  1969  ? ? ?Social History ?Maybelline Kolarik Estabrooks  reports that she has never smoked. She has never used smokeless tobacco. She reports current alcohol use. She reports that she does not use drugs. ? ?family history includes Alzheimer's disease in her mother; Asthma in her child; Breast cancer in her maternal aunt and paternal grandmother; Diabetes in her brother and maternal grandmother; Emphysema in her maternal grandfather; Heart disease in her paternal grandfather; Heart disease (age of onset: 21) in her brother; Heart failure in her father and maternal grandmother; Hyperlipidemia in her sister; Hypertension in her brother and father. ? ?Allergies  ?Allergen Reactions  ? Cocoa Other (See Comments)  ?  migraine  ? Chocolate Other (See Comments)  ?  migraine  ? Crestor [Rosuvastatin]   ?  myalgias  ? Erythromycin Nausea And Vomiting  ? Morphine And Related Nausea And Vomiting  ? Amoxicillin Rash  ? Other Rash  ?  Tape, bandaids  ? Sulfamethoxazole-Trimethoprim Nausea Only  ? ? ?  ? ?PHYSICAL EXAMINATION: ?Vital signs: BP 120/70   Pulse 66   Ht '5\' 1"'$  (1.549 m) Comment: height measured without shoes  Wt 147 lb 2 oz (66.7 kg)   BMI 27.80 kg/m?   ?Constitutional: generally well-appearing, no acute distress ?Psychiatric: alert and oriented x3, cooperative ?Eyes: extraocular movements intact, anicteric, conjunctiva pink ?Mouth: oral pharynx moist, no lesions ?Neck: supple no lymphadenopathy ?Cardiovascular: heart regular rate and rhythm, no murmur ?Lungs: clear to auscultation bilaterally ?Abdomen: soft, complains of mild tenderness with palpation over the lower abdomen, nondistended, no obvious ascites, no peritoneal signs, normal bowel sounds, no organomegaly ?Rectal: Deferred until colonoscopy ?Extremities: no clubbing or cyanosis.  Trace lower extremity edema bilaterally ?Skin: no lesions on visible extremities ?Neuro: No focal  deficits.  Cranial nerves intact ? ?ASSESSMENT: ? ?1.  72-monthhistory of lower abdominal cramping followed by urgency and loose stools.  This after an acute illness consistent with gastrointestinal viral syndrome such as norovirus.  She may be experiencing postinfectious irritable bowel syndrome.  Alternatively undiagnosed infectious process such as Giardia.  Finally, entities such as microscopic colitis. ?2.  Multiple general medical problems.  Stable ?3.  Status post cholecystectomy ?4.  GERD.  Stable ?5.  Intermittent nausea ? ?PLAN: ? ?1.  Prescribe metronidazole 250 mg 3 times daily for 10 days.  #30.  No refills.  Medication risks reviewed ?2.  Prescribe Bentyl 10 mg.;  #60; 1 or 2 every 4-6 hours as needed for abdominal cramping.  3 refills.  Medication risks reviewed ?3.  Prescribe Zofran 4 mg.  #30.  1 every 4 hours as needed for nausea.  3 refills..  Medication risks reviewed ?4.  Schedule colonoscopy with biopsies.The nature of the procedure, as well as the risks, benefits, and alternatives were carefully and thoroughly reviewed with the  patient. Ample time for discussion and questions allowed. The patient understood, was satisfied, and agreed to proceed.  ?5.  Reflux precautions ?6.  Continue PPI ?7.  Ongoing general medical care with PCP ?A total time of 40 minutes was spent preparing to see the patient, reviewing outside records, laboratories, x-rays, and prior endoscopy reports.  Obtaining comprehensive history personally, performing medically appropriate physical examination, counseling and educating the patient and her husband regarding the above listed issues, ordering multiple medications, and endoscopic procedure.  Finally, documenting clinical information in the health record ? ?  ?

## 2021-10-28 NOTE — Patient Instructions (Signed)
If you are age 68 or older, your body mass index should be between 23-30. Your Body mass index is 27.8 kg/m?Marland Kitchen If this is out of the aforementioned range listed, please consider follow up with your Primary Care Provider. ? ?If you are age 88 or younger, your body mass index should be between 19-25. Your Body mass index is 27.8 kg/m?Marland Kitchen If this is out of the aformentioned range listed, please consider follow up with your Primary Care Provider.  ? ?________________________________________________________ ? ?The [facility] providers would like to encourage you to use [facility] to communicate with providers for non-urgent requests or questions.  Due to long hold times on the telephone, sending your provider a message by [facility] may be a faster and more efficient way to get a response.  Please allow 48 business hours for a response.  Please remember that this is for non-urgent requests.  ?_______________________________________________________ ? ?We have sent the following medications to your pharmacy for you to pick up at your convenience:  Flagyl, Zofran, Bentyl ? ?You have been scheduled for a colonoscopy. Please follow written instructions given to you at your visit today.  ?Please pick up your prep supplies at the pharmacy within the next 1-3 days. ?If you use inhalers (even only as needed), please bring them with you on the day of your procedure. ? ? ?

## 2021-10-29 ENCOUNTER — Telehealth: Payer: Self-pay | Admitting: Internal Medicine

## 2021-10-29 NOTE — Telephone Encounter (Signed)
Faxed a coupon to pharmacy for Plenvu - we do not do PA's for preps

## 2021-10-29 NOTE — Telephone Encounter (Signed)
Patient went to pharmacy yesterday to pick up the Plenvu.  She was told that her insurance required a prior authorization.  Please advise.  Thank you.

## 2021-11-03 DIAGNOSIS — M1712 Unilateral primary osteoarthritis, left knee: Secondary | ICD-10-CM | POA: Diagnosis not present

## 2021-11-03 DIAGNOSIS — M25562 Pain in left knee: Secondary | ICD-10-CM | POA: Diagnosis not present

## 2021-11-12 ENCOUNTER — Encounter: Payer: Self-pay | Admitting: Internal Medicine

## 2021-11-19 ENCOUNTER — Encounter: Payer: Self-pay | Admitting: Internal Medicine

## 2021-11-19 ENCOUNTER — Ambulatory Visit (AMBULATORY_SURGERY_CENTER): Payer: Medicare PPO | Admitting: Internal Medicine

## 2021-11-19 VITALS — BP 134/65 | HR 65 | Temp 96.8°F | Resp 18 | Ht 61.0 in | Wt 147.0 lb

## 2021-11-19 DIAGNOSIS — K6289 Other specified diseases of anus and rectum: Secondary | ICD-10-CM | POA: Diagnosis not present

## 2021-11-19 DIAGNOSIS — R197 Diarrhea, unspecified: Secondary | ICD-10-CM | POA: Diagnosis not present

## 2021-11-19 DIAGNOSIS — K5120 Ulcerative (chronic) proctitis without complications: Secondary | ICD-10-CM | POA: Diagnosis not present

## 2021-11-19 DIAGNOSIS — R109 Unspecified abdominal pain: Secondary | ICD-10-CM | POA: Diagnosis not present

## 2021-11-19 MED ORDER — SODIUM CHLORIDE 0.9 % IV SOLN: 500.0000 mL | Freq: Once | INTRAVENOUS | Status: DC

## 2021-11-19 NOTE — Op Note (Signed)
[facility] Patient Name: [name] Procedure Date: 11/19/2021 1:54 PM MRN: [id number] Endoscopist: Docia Chuck. Henrene Pastor , MD Age: 66 Referring MD:  Date of Birth: 05-Jun-1956 Gender: Female Account #: [id number] Procedure:                Colonoscopy with biopsies Indications:              Clinically significant diarrhea of unexplained                            origin. Previous colonoscopy revealed                            diverticulosis but was otherwise normal (2014) Medicines:                Monitored Anesthesia Care Procedure:                Pre-Anesthesia Assessment:                           - Prior to the procedure, a History and Physical                            was performed, and patient medications and                            allergies were reviewed. The patient's tolerance of                            previous anesthesia was also reviewed. The risks                            and benefits of the procedure and the sedation                            options and risks were discussed with the patient.                            All questions were answered, and informed consent                            was obtained. Prior Anticoagulants: The patient has                            taken no previous anticoagulant or antiplatelet                            agents. ASA Grade Assessment: II - A patient with                            mild systemic disease. After reviewing the risks                            and benefits, the patient was deemed in  satisfactory condition to undergo the procedure.                           After obtaining informed consent, the colonoscope                            was passed under direct vision. Throughout the                            procedure, the patient's blood pressure, pulse, and                            oxygen saturations were monitored continuously. The                            CF [id number]  #[id number] was introduced through the anus                            and advanced to the the cecum, identified by                            appendiceal orifice and ileocecal valve. The                            terminal ileum, ileocecal valve, appendiceal                            orifice, and rectum were photographed. The quality                            of the bowel preparation was excellent. The                            colonoscopy was performed without difficulty. The                            patient tolerated the procedure well. The bowel                            preparation used was SUPREP via split dose                            instruction. Scope In: 2:12:42 PM Scope Out: 2:25:27 PM Scope Withdrawal Time: 0 hours 10 minutes 27 seconds  Total Procedure Duration: 0 hours 12 minutes 45 seconds  Findings:                 The terminal ileum appeared normal.                           Diverticula were found in the sigmoid colon.                           The distal rectum revealed areas of erythema with  hematin and mucus. Biopsies taken to rule out                            idiopathic proctitis versus prep change. The                            colonic mucosa proximal to this area was normal.                            Random biopsies were taken with a cold forceps for                            histology to rule out microscopic colitis. Complications:            No immediate complications. Estimated blood loss:                            None. Estimated Blood Loss:     Estimated blood loss: none. Impression:               - The examined portion of the ileum was normal.                           - Diverticulosis in the sigmoid colon.                           - The rectum with small area of possible proctitis.                            Biopsied. Remainder of colon normal. Biopsied. Recommendation:           - Repeat colonoscopy in 10 years for  screening                            purposes.                           - Patient has a contact number available for                            emergencies. The signs and symptoms of potential                            delayed complications were discussed with the                            patient. Return to normal activities tomorrow.                            Written discharge instructions were provided to the                            patient.                           - Resume previous diet.                           -  Continue present medications. Continue with                            Imodium as needed for diarrhea.                           - Await pathology results. We will be in touch                            after the results are available.                           - Recommend bulk fiber such as Citrucel 2                            tablespoons daily. This will bind water in an                            effort to improve stool consistency Denny Mccree N. Henrene Pastor, MD 11/19/2021 2:36:29 PM This report has been signed electronically.

## 2021-11-19 NOTE — Progress Notes (Signed)
HISTORY OF PRESENT ILLNESS:   [name] is a 66 y.o. female with past medical history as listed below who presents today regarding problems with lower abdominal cramping followed by urgency and loose stools.  The patient was last seen in this office September 02, 2020 regarding episodic upper abdominal pain with nausea and vomiting felt secondary to gallstones for which she underwent subsequent cholecystectomy.  She did well thereafter with resolution of upper abdominal complaints.  Patient's current history is that she was in her usual state of good health until July 18, 2021 when she presented to the emergency room with severe abdominal pain, nausea, vomiting, diarrhea.  She had lower abdominal tenderness on physical examination.  She had an elevated white blood cell count of 17,000.  CT scan was unremarkable.  She was treated with symptomatic therapies (IV Zofran) and discharged home.  Since that time she reports problems with significant lower abdominal cramping followed by urgency and multiple loose stools.  This often occurs in the morning after consuming coffee.  However, it can occur other times a day and has also occurred at night waking her from sleep.  There has been no bleeding or weight loss.  Her last colonoscopy was performed April 2014.  This revealed moderate left-sided diverticulosis but was otherwise normal including evaluation of the terminal ileum.  She denies any change in medications coincident with her symptoms.  She has been on a stable dose of metformin for some time.  She does tell me that after her acute illness in February her husband and grandchildren suffered a similar ailment.  They felt they may have had a norovirus.  She is accompanied today by her husband.  Hospital emergency room encounter, laboratories, x-rays personally reviewed.  She has GERD and is on PPI.  No active GERD symptoms.  She does report nausea and might like to have on hand and antiemetic   REVIEW OF  SYSTEMS:   All non-GI ROS negative unless otherwise stated in the HPI except for arthritis, back pain, fatigue, heart rhythm change, headaches, ankle swelling, allergies       Past Medical History:  Diagnosis Date   ANXIETY 11/07/2009    Qualifier: Diagnosis of  By: Shane Crutch, Amy S - came after her diagnosis of breast cancer   Breast CA ([place])      right - radiation & surgery   Diabetes ([place])      Type II, diet control   DIVERTICULOSIS, COLON 06/26/2004    Qualifier: Diagnosis of  By: Arsenio Loader RN, Carissa     DM2 (diabetes mellitus, type 2) ([place]) 11/07/2009    Qualifier: Diagnosis of  By: Shane Crutch, Amy S    Family history of heart disease     Fatty liver     Gallstone     GASTRIC POLYP 03/30/2007    Qualifier: Diagnosis of  By: Arsenio Loader RN, Carissa     Gastroparesis      mild   GERD 11/07/2009    Qualifier: Diagnosis of  By: Shane Crutch, Amy S    History of nuclear stress test 12/2011    bruce protocol; no evidence of ischemia, EF 79%, normal pattern of perfusion   Hyperchloremia     Hyperlipidemia     HYPERTHYROIDISM 11/07/2009    Qualifier: Diagnosis of  By: Trellis Paganini PA-c, Amy S    Irritable bowel syndrome 08/04/2007    Qualifier: Diagnosis of  By: Arsenio Loader RN, Carissa     MVP (mitral valve prolapse)  last echo 07/1997 - normal LV size/function, mild MVP, mild MR   Osteopenia     OVARIAN CYST 08/04/2007    Qualifier: Diagnosis of  By: Arsenio Loader RN, Ramond Craver     Personal history of radiation therapy     PVC (premature ventricular contraction)     Vitamin D deficiency             Past Surgical History:  Procedure Laterality Date   ABDOMINAL HYSTERECTOMY   03/2003   APPENDECTOMY   01/2002   BREAST LUMPECTOMY Right 2006    x2   CARDIAC CATHETERIZATION   10/13/2009    no significant fixed obstructive CAD, normral ramus intermediate/circumflex/RCA; normal LV function (Dr. Corky Downs)   CHOLECYSTECTOMY N/A 11/18/2020    Procedure: LAPAROSCOPIC CHOLECYSTECTOMY;   Surgeon: Ralene Ok, MD;  Location: [facility] OR;  Service: General;  Laterality: N/A;   [place]  reports that she has never smoked. She has never used smokeless tobacco. She reports current alcohol use. She reports that she does not use drugs.   family history includes Alzheimer's disease in her mother; Asthma in her child; Breast cancer in her maternal aunt and paternal grandmother; Diabetes in her brother and maternal grandmother; Emphysema in her maternal grandfather; Heart disease in her paternal grandfather; Heart disease (age of onset: 42) in her brother; Heart failure in her father and maternal grandmother; Hyperlipidemia in her sister; Hypertension in her brother and father.        Allergies  Allergen Reactions   Cocoa Other (See Comments)      migraine   Chocolate Other (See Comments)      migraine   Crestor [Rosuvastatin]        myalgias   Erythromycin Nausea And Vomiting   Morphine And Related Nausea And Vomiting   Amoxicillin Rash   Other Rash      Tape, bandaids   Sulfamethoxazole-Trimethoprim Nausea Only          PHYSICAL EXAMINATION: Vital signs: BP 120/70   Pulse 66   Ht '5\' 1"'$  (1.549 m) Comment: height measured without shoes  Wt 147 lb 2 oz (66.7 kg)   BMI 27.80 kg/m   Constitutional: generally well-appearing, no acute distress Psychiatric: alert and oriented x3, cooperative Eyes: extraocular movements intact, anicteric, conjunctiva pink Mouth: oral pharynx moist, no lesions Neck: supple no lymphadenopathy Cardiovascular: heart regular rate and rhythm, no murmur Lungs: clear to auscultation bilaterally Abdomen: soft, complains of mild tenderness with palpation over the lower abdomen, nondistended, no obvious ascites, no peritoneal signs, normal bowel sounds, no organomegaly Rectal: Deferred until colonoscopy Extremities: no clubbing or cyanosis.  Trace lower extremity edema  bilaterally Skin: no lesions on visible extremities Neuro: No focal deficits.  Cranial nerves intact   ASSESSMENT:   1.  49-monthhistory of lower abdominal cramping followed by urgency and loose stools.  This after an acute illness consistent with gastrointestinal viral syndrome such as norovirus.  She may be experiencing postinfectious irritable bowel syndrome.  Alternatively undiagnosed infectious process such as Giardia.  Finally, entities such as microscopic colitis. 2.  Multiple general medical problems.  Stable 3.  Status post cholecystectomy 4.  GERD.  Stable 5.  Intermittent nausea   PLAN:   1.  Prescribe metronidazole 250 mg 3 times daily for 10 days.  #30.  No refills.  Medication risks reviewed 2.  Prescribe Bentyl 10  mg.;  #60; 1 or 2 every 4-6 hours as needed for abdominal cramping.  3 refills.  Medication risks reviewed 3.  Prescribe Zofran 4 mg.  #30.  1 every 4 hours as needed for nausea.  3 refills..  Medication risks reviewed 4.  Schedule colonoscopy with biopsies.The nature of the procedure, as well as the risks, benefits, and alternatives were carefully and thoroughly reviewed with the patient. Ample time for discussion and questions allowed. The patient understood, was satisfied, and agreed to proceed.  5.  Reflux precautions 6.  Continue PPI 7.  Ongoing general medical care with PCP

## 2021-11-19 NOTE — Progress Notes (Signed)
Called to room to assist during endoscopic procedure.  Patient ID and intended procedure confirmed with present staff. Received instructions for my participation in the procedure from the performing physician.  

## 2021-11-19 NOTE — Progress Notes (Signed)
Sedate, gd SR, tolerated procedure well, VSS, report to RN 

## 2021-11-19 NOTE — Patient Instructions (Addendum)
CONTINUE PRESENT MEDICATIONS  CONTINUE IMODIUM AS NEEDED FOR DIARRHEA ' AWAIT BIOPSY RESULTS   DR Henrene Pastor RECOMMENDS BULK FIBER SUCH AS CITRUCEL 2 TABLESPOONS DAILY TO IMPROVE STOOL CONSISTENCY    YOU HAD AN ENDOSCOPIC PROCEDURE TODAY AT [place]:   Refer to the procedure report that was given to you for any specific questions about what was found during the examination.  If the procedure report does not answer your questions, please call your gastroenterologist to clarify.  If you requested that your care partner not be given the details of your procedure findings, then the procedure report has been included in a sealed envelope for you to review at your convenience later.  YOU SHOULD EXPECT: Some feelings of bloating in the abdomen. Passage of more gas than usual.  Walking can help get rid of the air that was put into your GI tract during the procedure and reduce the bloating. If you had a lower endoscopy (such as a colonoscopy or flexible sigmoidoscopy) you may notice spotting of blood in your stool or on the toilet paper. If you underwent a bowel prep for your procedure, you may not have a normal bowel movement for a few days.  Please Note:  You might notice some irritation and congestion in your nose or some drainage.  This is from the oxygen used during your procedure.  There is no need for concern and it should clear up in a day or so.  SYMPTOMS TO REPORT IMMEDIATELY:  Following lower endoscopy (colonoscopy or flexible sigmoidoscopy):  Excessive amounts of blood in the stool  Significant tenderness or worsening of abdominal pains  Swelling of the abdomen that is new, acute  Fever of 100F or higher   For urgent or emergent issues, a gastroenterologist can be reached at any hour by calling [phone]. Do not use MyChart messaging for urgent concerns.    DIET:  We do recommend a small meal at first, but then you may proceed to your regular diet.  Drink  plenty of fluids but you should avoid alcoholic beverages for 24 hours.  ACTIVITY:  You should plan to take it easy for the rest of today and you should NOT DRIVE or use heavy machinery until tomorrow (because of the sedation medicines used during the test).    FOLLOW UP: Our staff will call the number listed on your records 24-72 hours following your procedure to check on you and address any questions or concerns that you may have regarding the information given to you following your procedure. If we do not reach you, we will leave a message.  We will attempt to reach you two times.  During this call, we will ask if you have developed any symptoms of COVID 19. If you develop any symptoms (ie: fever, flu-like symptoms, shortness of breath, cough etc.) before then, please call [phone].  If you test positive for Covid 19 in the 2 weeks post procedure, please call and report this information to [place].    If any biopsies were taken you will be contacted by phone or by letter within the next 1-3 weeks.  Please call us at [phone] if you have not heard about the biopsies in 3 weeks.    SIGNATURES/CONFIDENTIALITY: You and/or your care partner have signed paperwork which will be entered into your electronic medical record.  These signatures attest to the fact that that the information above on your After Visit Summary has been reviewed and is understood.  Full responsibility of the confidentiality of this discharge information lies with you and/or your care-partner.  

## 2021-11-20 ENCOUNTER — Telehealth: Payer: Self-pay

## 2021-11-20 NOTE — Telephone Encounter (Signed)
  Follow up Call-     11/19/2021    1:10 PM 07/16/2020    9:35 AM  Call back number  Post procedure Call Back phone  # [phone]  Permission to leave phone message Yes Yes     Patient questions:  Do you have a fever, pain , or abdominal swelling? No. Pain Score  0 *  Have you tolerated food without any problems? Yes.    Have you been able to return to your normal activities? Yes.    Do you have any questions about your discharge instructions: Diet   No. Medications  No. Follow up visit  No.  Do you have questions or concerns about your Care? No.  Actions: * If pain score is 4 or above: No action needed, pain <4.

## 2021-12-02 ENCOUNTER — Encounter: Payer: Self-pay | Admitting: Internal Medicine

## 2022-01-11 ENCOUNTER — Other Ambulatory Visit: Payer: Self-pay | Admitting: Internal Medicine

## 2022-01-14 DIAGNOSIS — J019 Acute sinusitis, unspecified: Secondary | ICD-10-CM | POA: Diagnosis not present

## 2022-02-01 DIAGNOSIS — M1712 Unilateral primary osteoarthritis, left knee: Secondary | ICD-10-CM | POA: Diagnosis not present

## 2022-02-01 DIAGNOSIS — M25562 Pain in left knee: Secondary | ICD-10-CM | POA: Diagnosis not present

## 2022-02-08 ENCOUNTER — Other Ambulatory Visit: Payer: Self-pay | Admitting: Internal Medicine

## 2022-02-08 DIAGNOSIS — E1169 Type 2 diabetes mellitus with other specified complication: Secondary | ICD-10-CM | POA: Diagnosis not present

## 2022-02-08 DIAGNOSIS — E538 Deficiency of other specified B group vitamins: Secondary | ICD-10-CM | POA: Diagnosis not present

## 2022-02-08 DIAGNOSIS — E519 Thiamine deficiency, unspecified: Secondary | ICD-10-CM | POA: Diagnosis not present

## 2022-02-17 DIAGNOSIS — I493 Ventricular premature depolarization: Secondary | ICD-10-CM | POA: Diagnosis not present

## 2022-02-17 DIAGNOSIS — K2100 Gastro-esophageal reflux disease with esophagitis, without bleeding: Secondary | ICD-10-CM | POA: Diagnosis not present

## 2022-02-17 DIAGNOSIS — Z23 Encounter for immunization: Secondary | ICD-10-CM | POA: Diagnosis not present

## 2022-02-17 DIAGNOSIS — Z1389 Encounter for screening for other disorder: Secondary | ICD-10-CM | POA: Diagnosis not present

## 2022-02-17 DIAGNOSIS — I700 Atherosclerosis of aorta: Secondary | ICD-10-CM | POA: Diagnosis not present

## 2022-02-17 DIAGNOSIS — M1712 Unilateral primary osteoarthritis, left knee: Secondary | ICD-10-CM | POA: Diagnosis not present

## 2022-02-17 DIAGNOSIS — Z0000 Encounter for general adult medical examination without abnormal findings: Secondary | ICD-10-CM | POA: Diagnosis not present

## 2022-02-17 DIAGNOSIS — E1169 Type 2 diabetes mellitus with other specified complication: Secondary | ICD-10-CM | POA: Diagnosis not present

## 2022-02-17 DIAGNOSIS — Z6826 Body mass index (BMI) 26.0-26.9, adult: Secondary | ICD-10-CM | POA: Diagnosis not present

## 2022-02-17 DIAGNOSIS — R931 Abnormal findings on diagnostic imaging of heart and coronary circulation: Secondary | ICD-10-CM | POA: Diagnosis not present

## 2022-02-17 DIAGNOSIS — E663 Overweight: Secondary | ICD-10-CM | POA: Diagnosis not present

## 2022-02-24 ENCOUNTER — Telehealth: Payer: Self-pay | Admitting: Internal Medicine

## 2022-02-24 NOTE — Telephone Encounter (Signed)
Follow Up:    Jusy from Dr Dorna Leitz office called. She is calling to check on the status of the clearnce that was faxed on 02-10-22. Please fax to [phone].

## 2022-02-26 NOTE — Telephone Encounter (Signed)
Please notify patient and Dr. Berenice Primas office that clearance has not been received.

## 2022-02-26 NOTE — Telephone Encounter (Signed)
Left message for [name] with requesting office we still have not received clearance request yet. Please fax clearance to [phone] att pre op team

## 2022-02-27 ENCOUNTER — Other Ambulatory Visit: Payer: Self-pay | Admitting: Internal Medicine

## 2022-03-01 NOTE — Telephone Encounter (Signed)
   Pre-operative Risk Assessment    Patient Name: [name]  DOB: [birth year] MRN: [id number]      Request for Surgical Clearance    Procedure:   LEFT TOTAL KNEE ARTHRIOPLASTY  Date of Surgery:  Clearance 03/17/22                                 Surgeon:  DR. Dorna Leitz Surgeon's Group or Practice Name:  [facility] Phone number:  [phone]  Fax number:  [phone] ATTN: JUDY DANIELS    Type of Clearance Requested:   - Medical ; ,ASA   Type of Anesthesia:  Spinal   Additional requests/questions:    [name]   03/01/2022, 11:47 AM

## 2022-03-02 DIAGNOSIS — M1712 Unilateral primary osteoarthritis, left knee: Secondary | ICD-10-CM | POA: Diagnosis not present

## 2022-03-02 DIAGNOSIS — M25562 Pain in left knee: Secondary | ICD-10-CM | POA: Diagnosis not present

## 2022-03-03 NOTE — Telephone Encounter (Signed)
   Name: [name]  DOB: Nov 11, 1955  MRN: [id number]  Primary Cardiologist: Pixie Casino, MD  Chart reviewed as part of pre-operative protocol coverage. Because of [name]'s past medical history and time since last visit, she will require a follow-up in-office visit in order to better assess preoperative cardiovascular risk.  Last OV >1 year ago (11/2020), also had mychart msg in 10/2021 with edema. Needs in office visit.  Pre-op covering staff: - Please schedule appointment and call patient to inform them. If patient already had an upcoming appointment within acceptable timeframe, please add "pre-op clearance" to the appointment notes so provider is aware. - Please contact requesting surgeon's office via preferred method (i.e, phone, fax) to inform them of need for appointment prior to surgery.  Re: holding ASA, per chart review, has h/o nonobstructive disease but no prior PCI/MI therefore meets criteria for being able to hold if needed - can finalize at upcoming [place].  [name], PA-C  03/03/2022, 8:46 AM

## 2022-03-03 NOTE — Telephone Encounter (Signed)
Pt agreeable to plan of care for in office appt 03/10/22 @ 8:50 with Coletta Memos, FNP. Pt did want to let provider know that she just had CBC with Dr. Berenice Primas (surgeon doing her surgery), as well as she has had labs with PCP.

## 2022-03-04 DIAGNOSIS — M1732 Unilateral post-traumatic osteoarthritis, left knee: Secondary | ICD-10-CM | POA: Diagnosis not present

## 2022-03-04 DIAGNOSIS — R262 Difficulty in walking, not elsewhere classified: Secondary | ICD-10-CM | POA: Diagnosis not present

## 2022-03-04 DIAGNOSIS — M25662 Stiffness of left knee, not elsewhere classified: Secondary | ICD-10-CM | POA: Diagnosis not present

## 2022-03-08 NOTE — Progress Notes (Unsigned)
Cardiology Clinic Note   Patient Name: [name] [id number] Date of Encounter: 03/10/2022  Primary Care Provider:  Kathyrn Lass, MD Primary Cardiologist:  Pixie Casino, MD  Patient Profile    [name] 66 year old female presents the clinic today for follow-up evaluation of her coronary artery disease and atrial fibrillation.  Past Medical History    Past Medical History:  Diagnosis Date   ANXIETY 11/07/2009   Qualifier: Diagnosis of  By: Shane Crutch, Amy S - came after her diagnosis of breast cancer   Breast CA ([place])    right - radiation & surgery   Diabetes ([place])    Type II, diet control   DIVERTICULOSIS, COLON 06/26/2004   Qualifier: Diagnosis of  By: Arsenio Loader RN, Carissa     DM2 (diabetes mellitus, type 2) ([place]) 11/07/2009   Qualifier: Diagnosis of  By: Shane Crutch, Amy S    Family history of heart disease    Fatty liver    Gallstone    GASTRIC POLYP 03/30/2007   Qualifier: Diagnosis of  By: Arsenio Loader RN, Carissa     Gastroparesis    mild   GERD 11/07/2009   Qualifier: Diagnosis of  By: Shane Crutch, Amy S    History of nuclear stress test 12/2011   bruce protocol; no evidence of ischemia, EF 79%, normal pattern of perfusion   Hyperchloremia    Hyperlipidemia    HYPERTHYROIDISM 11/07/2009   Qualifier: Diagnosis of  By: Trellis Paganini PA-c, Amy S    Irritable bowel syndrome 08/04/2007   Qualifier: Diagnosis of  By: Arsenio Loader RN, Carissa     MVP (mitral valve prolapse)    last echo 07/1997 - normal LV size/function, mild MVP, mild MR   Osteopenia    OVARIAN CYST 08/04/2007   Qualifier: Diagnosis of  By: Arsenio Loader RN, Ramond Craver     Personal history of radiation therapy    PVC (premature ventricular contraction)    Vitamin D deficiency    Past Surgical History:  Procedure Laterality Date   ABDOMINAL HYSTERECTOMY  03/2003   APPENDECTOMY  01/2002   BREAST LUMPECTOMY Right 2006   x2   CARDIAC CATHETERIZATION  10/13/2009   no significant fixed obstructive CAD,  normral ramus intermediate/circumflex/RCA; normal LV function (Dr. Corky Downs)   CHOLECYSTECTOMY N/A 11/18/2020   Procedure: LAPAROSCOPIC CHOLECYSTECTOMY;  Surgeon: Ralene Ok, MD;  Location: [facility];  Service: General;  Laterality: N/A;   LAPAROSCOPIC OOPHERECTOMY     TONSILLECTOMY  1969    Allergies  Allergies  Allergen Reactions   Cocoa Other (See Comments)    migraine   Chocolate Other (See Comments)    migraine   Crestor [Rosuvastatin]     myalgias   Erythromycin Nausea And Vomiting   Morphine And Related Nausea And Vomiting   Amoxicillin Rash   Other Rash    Tape, bandaids   Sulfamethoxazole-Trimethoprim Nausea Only    History of Present Illness    [name] is a PMH of PVCs, atrial fibrillation, coronary artery disease, sinusitis, GERD, diverticulosis, type 2 diabetes, ovarian cyst, anxiety, heartburn, fatigue, shortness of breath, and chest pressure.  She was seen in follow-up by Dr. Debara Pickett on 11/27/2020.  She continued to do well from cardiac standpoint.  She reported that she had recently had issues with her gallbladder.  She underwent cholecystectomy.  She had been losing weight.  She had changed her diet and cut out fats.  Her weight was down to 139 pounds from 151 pounds.  Her LDL had decreased to 62.  Her triglycerides were slightly higher and she reported that she has stopped Vascepa.  Her blood pressure was well controlled and her EKG showed sinus rhythm.  She presents to the clinic today for follow-up evaluation and preoperative cardiac evaluation.  She states she feels well.  She is having trouble with her left knee and is ready to have knee surgery.  We reviewed her physical activity and she is limited due to her knee pain.  She was able to go on vacation with her family this summer to the [place] and climb up and down the lighthouse without chest discomfort.  Her blood pressure is well controlled.  Her EKG today shows normal sinus rhythm 62 bpm.  I will  continue her current medication regimen.  She reports that she is unable to take Vascepa since having her gallbladder taken out.  We will have her follow-up in 1 year.  I will order fasting lipids and LFTs.  Today she denies chest pain, shortness of breath, lower extremity edema, fatigue, palpitations, melena, hematuria, hemoptysis, diaphoresis, weakness, presyncope, syncope, orthopnea, and PND.   Home Medications    Prior to Admission medications   Medication Sig Start Date End Date Taking? Authorizing Provider  Alum & Mag Hydroxide-Simeth (MYLANTA PO) Take 1 Dose by mouth as needed.    [provider]  aspirin EC 81 MG tablet Take 81 mg by mouth at bedtime.    [provider]  Calcium-Vitamin D-Vitamin K (VIACTIV CALCIUM PLUS D) 650-12.5-40 MG-MCG-MCG CHEW Chew 1 tablet by mouth in the morning and at bedtime.    [provider]  Cholecalciferol (VITAMIN D3 GUMMIES PO) Take 2 tablets by mouth in the morning.    [provider]  Cyanocobalamin (VITAMIN B-12 PO) Take 1,250 mcg by mouth in the morning.    [provider]  Dextrose-Fructose-Sod Citrate (NAUZENE PO) Take 2 tablets by mouth as needed.    [provider]  dicyclomine (BENTYL) 20 MG tablet Take 1-2 tablets every 4-6 hours as needed 10/28/21   Irene Shipper, MD  esomeprazole (NEXIUM) 40 MG capsule Take 1 capsule (40 mg total) by mouth in the morning and at bedtime. 02/08/22   Irene Shipper, MD  ezetimibe (ZETIA) 10 MG tablet Take 1 tablet (10 mg total) by mouth daily. Keep scheduled appointment for further refills 03/01/22   Pixie Casino, MD  flecainide (TAMBOCOR) 100 MG tablet Take 1 tablet (100 mg total) by mouth 2 (two) times daily. 05/12/21   Hilty, Nadean Corwin, MD  fluticasone (FLONASE) 50 MCG/ACT nasal spray Place 1 spray into both nostrils in the morning.    [provider]  Glucosamine HCl (GLUCOSAMINE PO) Take 2,000 mg by mouth in the morning.    [provider]  hydrochlorothiazide (HYDRODIURIL) 25 MG tablet TAKE ONE TABLET BY MOUTH DAILY 01/12/22   Hilty, Nadean Corwin, MD  hyoscyamine (LEVSIN SL) 0.125 MG SL tablet PLACE 1-2 TABLETS UNDER TONGUE EVERY FOUR HOURS AS NEEDED FOR ABDOMINAL PAIN. Patient not taking: Reported on 10/28/2021 10/14/17   Irene Shipper, MD  metFORMIN (GLUCOPHAGE) 500 MG tablet Take 500 mg by mouth in the morning and at bedtime. 08/10/19   [provider]  metroNIDAZOLE (FLAGYL) 250 MG tablet Take 1 tablet (250 mg total) by mouth 3 (three) times daily. 10/28/21   Irene Shipper, MD  Misc Natural Products (GLUCOSAMINE CHOND DOUBLE STR) TABS Take 1 tablet by mouth daily.  [provider]  Multiple Vitamins-Minerals (ADULT ONE DAILY GUMMIES PO) Take 2 tablets by mouth in the morning.    [provider]  nitrofurantoin, macrocrystal-monohydrate, (MACROBID) 100 MG capsule Take 1 capsule by mouth as needed. 10/13/21   [provider]  nitroGLYCERIN (NITROSTAT) 0.4 MG SL tablet Place 1 tablet (0.4 mg total) under the tongue every 5 (five) minutes as needed for chest pain. MAX 3 doses Patient not taking: Reported on 10/28/2021 01/12/18 10/28/21  Pixie Casino, MD  ondansetron (ZOFRAN-ODT) 4 MG disintegrating tablet Take 1 tablet (4 mg total) by mouth every 4 (four) hours as needed for nausea or vomiting. 10/28/21   Irene Shipper, MD  simvastatin (ZOCOR) 20 MG tablet TAKE 1 TABLET AT 6PM. 03/18/21   Hilty, Nadean Corwin, MD  VASCEPA 1 g capsule TAKE TWO CAPSULES BY MOUTH TWICE DAILY Patient not taking: Reported on 10/28/2021 06/02/21   Pixie Casino, MD    Family History    Family History  Problem Relation Age of Onset   Diabetes Maternal Grandmother    Heart failure Maternal Grandmother    Emphysema Maternal Grandfather    Breast cancer Paternal Grandmother    Heart disease Paternal Grandfather    Alzheimer's disease Mother    Heart failure Father    Hypertension Father        also murmur, hyperlipidemia,  MI, CABG age 36   Diabetes Brother    Heart disease Brother 74       MI at 86   Hypertension Brother    Hyperlipidemia Sister    Breast cancer Maternal Aunt        PVCs   Asthma Child        x2   Colon cancer Neg Hx    Colon polyps Neg Hx    Stomach cancer Neg Hx    Esophageal cancer Neg Hx    She indicated that her mother is alive. She indicated that her father is deceased. She indicated that the status of her sister is unknown. She indicated that her maternal grandmother is deceased. She indicated that her maternal grandfather is deceased. She indicated that her paternal grandmother is deceased. She indicated that her paternal grandfather is deceased. She indicated that the status of her child is unknown. She indicated that the status of her maternal aunt is unknown. She indicated that the status of her neg hx is unknown.  Social History    Social History   Socioeconomic History   Marital status: Married    Spouse name: Not on file   Number of children: 2   Years of education: bachelors   Highest education level: Not on file  Occupational History    Employer: [employer]  Tobacco Use   Smoking status: Never   Smokeless tobacco: Never  Vaping Use   Vaping Use: Never used  Substance and Sexual Activity   Alcohol use: Yes    Comment: occasionally   Drug use: No   Sexual activity: Not on file  Other Topics Concern   Not on file  Social History Narrative   Not on file   Social Determinants of Health   Financial Resource Strain: Not on file  Food Insecurity: Not on file  Transportation Needs: Not on file  Physical Activity: Not on file  Stress: Not on file  Social Connections: Not on file  Intimate Partner Violence: Not on file     Review of Systems    General:  No chills,  fever, night sweats or weight changes.  Cardiovascular:  No chest pain, dyspnea on exertion, edema, orthopnea, palpitations, paroxysmal nocturnal dyspnea. Dermatological: No rash,  lesions/masses Respiratory: No cough, dyspnea Urologic: No hematuria, dysuria Abdominal:   No nausea, vomiting, diarrhea, bright red blood per rectum, melena, or hematemesis Neurologic:  No visual changes, wkns, changes in mental status. All other systems reviewed and are otherwise negative except as noted above.  Physical Exam    VS:  BP 124/80   Pulse 62   Ht '5\' 1"'$  (1.549 m)   Wt 144 lb 12.8 oz (65.7 kg)   SpO2 98%   BMI 27.36 kg/m  , BMI Body mass index is 27.36 kg/m. GEN: Well nourished, well developed, in no acute distress. HEENT: normal. Neck: Supple, no JVD, carotid bruits, or masses. Cardiac: RRR, no murmurs, rubs, or gallops. No clubbing, cyanosis, edema.  Radials/DP/PT 2+ and equal bilaterally.  Respiratory:  Respirations regular and unlabored, clear to auscultation bilaterally. GI: Soft, nontender, nondistended, BS + x 4. MS: no deformity or atrophy. Skin: warm and dry, no rash. Neuro:  Strength and sensation are intact. Psych: Normal affect.  Accessory Clinical Findings    Recent Labs: 07/18/2021: ALT 26; BUN 20; Creatinine, Ser 0.72; Hemoglobin 15.5; Platelets 333; Potassium 3.7; Sodium 138   Recent Lipid Panel    Component Value Date/Time   CHOL 125 01/12/2018 0959   CHOL 144 10/05/2016 0900   CHOL 171 09/17/2014 0734   TRIG 161 (H) 01/12/2018 0959   TRIG 188 (H) 10/05/2016 0900   TRIG 177 (H) 09/17/2014 0734   HDL 34 (L) 01/12/2018 0959   HDL 30 (L) 10/05/2016 0900   HDL 36 (L) 09/17/2014 0734   CHOLHDL 3.7 01/12/2018 0959   CHOLHDL 4.8 10/05/2016 0900   CHOLHDL 5.1 03/14/2013 0735   VLDL 57 (H) 03/14/2013 0735   LDLCALC 59 01/12/2018 0959   LDLCALC 86 10/05/2016 0900   LDLCALC 100 (H) 09/17/2014 0734         ECG personally reviewed by me today-normal sinus rhythm low voltage QRS 62 bpm no ectopy- No acute changes  Assessment & Plan   1.  Coronary artery disease -no chest pain today.  No recent episodes of chest discomfort.  Underwent cardiac  CTA 7/19 which showed mild-moderate calcification of her ostial/proximal LAD. Continue bisoprolol, simvastatin, nitroglycerin, ezetimibe aspirin Heart healthy low-sodium diet-salty 6 given Increase physical activity as tolerated  PVCs-previously seen and evaluated by EP after wearing cardiac event monitor.  She was noted to have good control of her PVCs with flecainide therapy.  Previously noted with increased physical activity she will have increased PVCs. Continue flecainide Increase physical activity as tolerated  Hyperlipidemia- LDL 62 11/25/20 Continue Zetia, simvastatin, Vascepa Heart healthy low-sodium diet-salty 6 given Increase physical activity as tolerated Pete fasting lipids and LFTs  Preop cardiac exam-left total knee arthroplasty, 03/17/2022, Dr. Dorna Leitz, [facility]    Primary Cardiologist: Pixie Casino, MD  Chart reviewed as part of pre-operative protocol coverage. Given past medical history and time since last visit, based on ACC/AHA guidelines, Kaytlen Lightsey Stamps would be at acceptable risk for the planned procedure without further cardiovascular testing.   Patient was advised that if she develops new symptoms prior to surgery to contact our office to arrange a follow-up appointment.  He verbalized understanding.  Her aspirin may be held for 5-7 days prior to her surgery.  Please resume as soon as hemostasis is achieved.  Her RCRI is a class I  risk, 0.4% risk of major cardiac event.  She is able to complete greater than 4 METS of physical activity.   Disposition: Follow-up with Dr. Debara Pickett in 12 months.  Jossie Ng. Tanvir Hipple NP-C     03/10/2022, 8:53 AM [address] Office [phone] Fax [phone]  Notice: This dictation was prepared with Dragon dictation along with smaller phrase technology. Any transcriptional errors that result from this process are unintentional and may not be corrected upon  review.  I spent 15 minutes examining this patient, reviewing medications, and using patient centered shared decision making involving her cardiac care.  Prior to her visit I spent greater than 20 minutes reviewing her past medical history,  medications, and prior cardiac tests.

## 2022-03-10 ENCOUNTER — Ambulatory Visit: Payer: Medicare PPO | Attending: General Practice | Admitting: General Practice

## 2022-03-10 ENCOUNTER — Encounter: Payer: Self-pay | Admitting: General Practice

## 2022-03-10 VITALS — BP 124/80 | HR 62 | Ht 61.0 in | Wt 144.8 lb

## 2022-03-10 DIAGNOSIS — I25119 Atherosclerotic heart disease of native coronary artery with unspecified angina pectoris: Secondary | ICD-10-CM

## 2022-03-10 DIAGNOSIS — Z01810 Encounter for preprocedural cardiovascular examination: Secondary | ICD-10-CM

## 2022-03-10 DIAGNOSIS — I493 Ventricular premature depolarization: Secondary | ICD-10-CM | POA: Diagnosis not present

## 2022-03-10 DIAGNOSIS — E785 Hyperlipidemia, unspecified: Secondary | ICD-10-CM | POA: Diagnosis not present

## 2022-03-10 DIAGNOSIS — I10 Essential (primary) hypertension: Secondary | ICD-10-CM | POA: Diagnosis not present

## 2022-03-10 NOTE — Addendum Note (Signed)
Addended by: Waylan Rocher on: 03/10/2022 09:13 AM   Modules accepted: Orders

## 2022-03-10 NOTE — Patient Instructions (Signed)
Medication Instructions:  The current medical regimen is effective;  continue present plan and medications as directed. Please refer to the Current Medication list given to you today.  *If you need a refill on your cardiac medications before your next appointment, please call your pharmacy*   Lab Work: FASTING LIPID AND LFT If you have labs (blood work) drawn today and your tests are completely normal, you will receive your results only by: [place] (if you have MyChart) OR A paper copy in the mail If you have any lab test that is abnormal or we need to change your treatment, we will call you to review the results.  Other Instructions: OK FOR KNEE SURGERY   Follow-Up: At [facility], you and your health needs are our priority.  As part of our continuing mission to provide you with exceptional heart care, we have created designated Provider Care Teams.  These Care Teams include your primary Cardiologist (physician) and Advanced Practice Providers (APPs -  Physician Assistants and Nurse Practitioners) who all work together to provide you with the care you need, when you need it.  Your next appointment:   12 month(s)  The format for your next appointment:   In Person  Provider:   Pixie Casino, MD    Important Information About Sugar

## 2022-03-12 DIAGNOSIS — I25119 Atherosclerotic heart disease of native coronary artery with unspecified angina pectoris: Secondary | ICD-10-CM | POA: Diagnosis not present

## 2022-03-12 DIAGNOSIS — E785 Hyperlipidemia, unspecified: Secondary | ICD-10-CM | POA: Diagnosis not present

## 2022-03-12 DIAGNOSIS — I493 Ventricular premature depolarization: Secondary | ICD-10-CM | POA: Diagnosis not present

## 2022-03-12 LAB — LIPID PANEL
Chol/HDL Ratio: 3.5 ratio (ref 0.0–4.4)
Cholesterol, Total: 110 mg/dL (ref 100–199)
HDL: 31 mg/dL — ABNORMAL LOW (ref >39)
LDL Chol Calc (NIH): 32 mg/dL (ref 0–99)
Triglycerides: 320 mg/dL — ABNORMAL HIGH (ref 0–149)
VLDL Cholesterol Cal: 47 mg/dL — ABNORMAL HIGH (ref 5–40)

## 2022-03-12 LAB — HEPATIC FUNCTION PANEL
ALT: 13 IU/L (ref 0–32)
AST: 15 IU/L (ref 0–40)
Albumin: 4.6 g/dL (ref 3.9–4.9)
Alkaline Phosphatase: 70 IU/L (ref 44–121)
Bilirubin Total: 0.4 mg/dL (ref 0.0–1.2)
Bilirubin, Direct: 0.13 mg/dL (ref 0.00–0.40)
Total Protein: 6.5 g/dL (ref 6.0–8.5)

## 2022-03-15 ENCOUNTER — Other Ambulatory Visit: Payer: Self-pay | Admitting: General Practice

## 2022-03-15 ENCOUNTER — Other Ambulatory Visit: Payer: Self-pay

## 2022-03-15 DIAGNOSIS — I25119 Atherosclerotic heart disease of native coronary artery with unspecified angina pectoris: Secondary | ICD-10-CM

## 2022-03-15 DIAGNOSIS — E785 Hyperlipidemia, unspecified: Secondary | ICD-10-CM

## 2022-03-15 MED ORDER — FENOFIBRATE 120 MG PO TABS: 120.0000 mg | ORAL_TABLET | Freq: Every day | ORAL | 3 refills | Status: DC

## 2022-03-15 NOTE — Telephone Encounter (Signed)
Received note from pharmacy:  Pharmacy comment: This strength is unavailable, Please send in alternative dosing.discuss with Nehemiah Massed PHARMD and Denyse Amass, send '134mg'$  and put please fill with what ever is closest that is  covered by pt's insurance.

## 2022-03-17 DIAGNOSIS — Z96652 Presence of left artificial knee joint: Secondary | ICD-10-CM | POA: Diagnosis not present

## 2022-03-17 DIAGNOSIS — M1712 Unilateral primary osteoarthritis, left knee: Secondary | ICD-10-CM | POA: Diagnosis not present

## 2022-03-17 DIAGNOSIS — G8918 Other acute postprocedural pain: Secondary | ICD-10-CM | POA: Diagnosis not present

## 2022-03-19 DIAGNOSIS — Z96652 Presence of left artificial knee joint: Secondary | ICD-10-CM | POA: Diagnosis not present

## 2022-03-22 ENCOUNTER — Other Ambulatory Visit: Payer: Self-pay | Admitting: Internal Medicine

## 2022-03-22 DIAGNOSIS — Z96652 Presence of left artificial knee joint: Secondary | ICD-10-CM | POA: Diagnosis not present

## 2022-03-24 DIAGNOSIS — Z96652 Presence of left artificial knee joint: Secondary | ICD-10-CM | POA: Diagnosis not present

## 2022-03-26 DIAGNOSIS — Z96652 Presence of left artificial knee joint: Secondary | ICD-10-CM | POA: Diagnosis not present

## 2022-03-29 DIAGNOSIS — Z96652 Presence of left artificial knee joint: Secondary | ICD-10-CM | POA: Diagnosis not present

## 2022-03-30 DIAGNOSIS — Z9889 Other specified postprocedural states: Secondary | ICD-10-CM | POA: Diagnosis not present

## 2022-03-30 DIAGNOSIS — Z471 Aftercare following joint replacement surgery: Secondary | ICD-10-CM | POA: Diagnosis not present

## 2022-03-30 DIAGNOSIS — Z96652 Presence of left artificial knee joint: Secondary | ICD-10-CM | POA: Diagnosis not present

## 2022-03-31 DIAGNOSIS — Z96652 Presence of left artificial knee joint: Secondary | ICD-10-CM | POA: Diagnosis not present

## 2022-04-02 DIAGNOSIS — Z96652 Presence of left artificial knee joint: Secondary | ICD-10-CM | POA: Diagnosis not present

## 2022-04-05 ENCOUNTER — Other Ambulatory Visit: Payer: Self-pay | Admitting: Internal Medicine

## 2022-04-05 DIAGNOSIS — Z96652 Presence of left artificial knee joint: Secondary | ICD-10-CM | POA: Diagnosis not present

## 2022-04-08 DIAGNOSIS — Z96652 Presence of left artificial knee joint: Secondary | ICD-10-CM | POA: Diagnosis not present

## 2022-04-12 DIAGNOSIS — Z96652 Presence of left artificial knee joint: Secondary | ICD-10-CM | POA: Diagnosis not present

## 2022-04-15 DIAGNOSIS — Z96652 Presence of left artificial knee joint: Secondary | ICD-10-CM | POA: Diagnosis not present

## 2022-04-19 DIAGNOSIS — Z96652 Presence of left artificial knee joint: Secondary | ICD-10-CM | POA: Diagnosis not present

## 2022-04-22 DIAGNOSIS — Z96652 Presence of left artificial knee joint: Secondary | ICD-10-CM | POA: Diagnosis not present

## 2022-04-29 DIAGNOSIS — Z96652 Presence of left artificial knee joint: Secondary | ICD-10-CM | POA: Diagnosis not present

## 2022-04-29 MED ORDER — ICOSAPENT ETHYL 1 G PO CAPS: 2.0000 g | ORAL_CAPSULE | Freq: Two times a day (BID) | ORAL | 3 refills | Status: DC

## 2022-04-30 ENCOUNTER — Other Ambulatory Visit: Payer: Self-pay

## 2022-04-30 MED ORDER — VASCEPA 1 G PO CAPS: 2.0000 g | ORAL_CAPSULE | Freq: Two times a day (BID) | ORAL | 1 refills | Status: DC

## 2022-05-03 DIAGNOSIS — Z96652 Presence of left artificial knee joint: Secondary | ICD-10-CM | POA: Diagnosis not present

## 2022-05-05 DIAGNOSIS — Z96652 Presence of left artificial knee joint: Secondary | ICD-10-CM | POA: Diagnosis not present

## 2022-05-10 ENCOUNTER — Other Ambulatory Visit: Payer: Self-pay | Admitting: Internal Medicine

## 2022-05-11 ENCOUNTER — Ambulatory Visit: Payer: Medicare PPO | Admitting: Internal Medicine

## 2022-05-11 DIAGNOSIS — Z01419 Encounter for gynecological examination (general) (routine) without abnormal findings: Secondary | ICD-10-CM | POA: Diagnosis not present

## 2022-05-11 DIAGNOSIS — Z1231 Encounter for screening mammogram for malignant neoplasm of breast: Secondary | ICD-10-CM | POA: Diagnosis not present

## 2022-05-12 DIAGNOSIS — Z96652 Presence of left artificial knee joint: Secondary | ICD-10-CM | POA: Diagnosis not present

## 2022-05-19 DIAGNOSIS — Z96652 Presence of left artificial knee joint: Secondary | ICD-10-CM | POA: Diagnosis not present

## 2022-05-25 DIAGNOSIS — M1712 Unilateral primary osteoarthritis, left knee: Secondary | ICD-10-CM | POA: Diagnosis not present

## 2022-06-02 ENCOUNTER — Other Ambulatory Visit: Payer: Self-pay | Admitting: Obstetrics and Gynecology

## 2022-06-02 DIAGNOSIS — N6320 Unspecified lump in the left breast, unspecified quadrant: Secondary | ICD-10-CM

## 2022-06-16 DIAGNOSIS — G8929 Other chronic pain: Secondary | ICD-10-CM | POA: Diagnosis not present

## 2022-06-16 DIAGNOSIS — E1169 Type 2 diabetes mellitus with other specified complication: Secondary | ICD-10-CM | POA: Diagnosis not present

## 2022-06-16 DIAGNOSIS — E782 Mixed hyperlipidemia: Secondary | ICD-10-CM | POA: Diagnosis not present

## 2022-06-18 ENCOUNTER — Ambulatory Visit
Admission: RE | Admit: 2022-06-18 | Discharge: 2022-06-18 | Disposition: A | Discharge status: Home / Self Care | Payer: Medicare PPO | Source: Ambulatory Visit | Attending: Obstetrics and Gynecology | Admitting: Obstetrics and Gynecology

## 2022-06-18 DIAGNOSIS — N6320 Unspecified lump in the left breast, unspecified quadrant: Secondary | ICD-10-CM

## 2022-06-18 DIAGNOSIS — N6489 Other specified disorders of breast: Secondary | ICD-10-CM | POA: Diagnosis not present

## 2022-06-18 DIAGNOSIS — R922 Inconclusive mammogram: Secondary | ICD-10-CM | POA: Diagnosis not present

## 2022-06-22 DIAGNOSIS — Z961 Presence of intraocular lens: Secondary | ICD-10-CM | POA: Diagnosis not present

## 2022-06-22 DIAGNOSIS — H43813 Vitreous degeneration, bilateral: Secondary | ICD-10-CM | POA: Diagnosis not present

## 2022-06-22 DIAGNOSIS — E119 Type 2 diabetes mellitus without complications: Secondary | ICD-10-CM | POA: Diagnosis not present

## 2022-07-19 ENCOUNTER — Other Ambulatory Visit: Payer: Self-pay | Admitting: Internal Medicine

## 2022-07-27 DIAGNOSIS — M25562 Pain in left knee: Secondary | ICD-10-CM | POA: Diagnosis not present

## 2022-08-03 DIAGNOSIS — L821 Other seborrheic keratosis: Secondary | ICD-10-CM | POA: Diagnosis not present

## 2022-08-03 DIAGNOSIS — D485 Neoplasm of uncertain behavior of skin: Secondary | ICD-10-CM | POA: Diagnosis not present

## 2022-08-03 DIAGNOSIS — L814 Other melanin hyperpigmentation: Secondary | ICD-10-CM | POA: Diagnosis not present

## 2022-08-04 DIAGNOSIS — Z87440 Personal history of urinary (tract) infections: Secondary | ICD-10-CM | POA: Diagnosis not present

## 2022-08-04 DIAGNOSIS — R8290 Unspecified abnormal findings in urine: Secondary | ICD-10-CM | POA: Diagnosis not present

## 2022-08-04 DIAGNOSIS — L659 Nonscarring hair loss, unspecified: Secondary | ICD-10-CM | POA: Diagnosis not present

## 2022-08-20 DIAGNOSIS — E1169 Type 2 diabetes mellitus with other specified complication: Secondary | ICD-10-CM | POA: Diagnosis not present

## 2022-08-24 DIAGNOSIS — E1169 Type 2 diabetes mellitus with other specified complication: Secondary | ICD-10-CM | POA: Diagnosis not present

## 2022-08-24 DIAGNOSIS — M25562 Pain in left knee: Secondary | ICD-10-CM | POA: Diagnosis not present

## 2022-09-09 DIAGNOSIS — R058 Other specified cough: Secondary | ICD-10-CM | POA: Diagnosis not present

## 2022-09-09 DIAGNOSIS — Z6828 Body mass index (BMI) 28.0-28.9, adult: Secondary | ICD-10-CM | POA: Diagnosis not present

## 2022-09-09 DIAGNOSIS — J0100 Acute maxillary sinusitis, unspecified: Secondary | ICD-10-CM | POA: Diagnosis not present

## 2022-09-09 DIAGNOSIS — R059 Cough, unspecified: Secondary | ICD-10-CM | POA: Diagnosis not present

## 2022-11-02 DIAGNOSIS — M25511 Pain in right shoulder: Secondary | ICD-10-CM | POA: Diagnosis not present

## 2022-11-02 DIAGNOSIS — Z9181 History of falling: Secondary | ICD-10-CM | POA: Diagnosis not present

## 2022-11-02 DIAGNOSIS — Z6827 Body mass index (BMI) 27.0-27.9, adult: Secondary | ICD-10-CM | POA: Diagnosis not present

## 2022-11-02 DIAGNOSIS — M2550 Pain in unspecified joint: Secondary | ICD-10-CM | POA: Diagnosis not present

## 2022-11-02 DIAGNOSIS — E559 Vitamin D deficiency, unspecified: Secondary | ICD-10-CM | POA: Diagnosis not present

## 2022-11-02 DIAGNOSIS — M791 Myalgia, unspecified site: Secondary | ICD-10-CM | POA: Diagnosis not present

## 2022-11-02 DIAGNOSIS — I700 Atherosclerosis of aorta: Secondary | ICD-10-CM | POA: Diagnosis not present

## 2022-11-02 DIAGNOSIS — R5383 Other fatigue: Secondary | ICD-10-CM | POA: Diagnosis not present

## 2022-11-02 DIAGNOSIS — M8580 Other specified disorders of bone density and structure, unspecified site: Secondary | ICD-10-CM | POA: Diagnosis not present

## 2022-11-05 ENCOUNTER — Other Ambulatory Visit: Payer: Self-pay | Admitting: Family Medicine

## 2022-11-05 DIAGNOSIS — M8580 Other specified disorders of bone density and structure, unspecified site: Secondary | ICD-10-CM

## 2022-11-10 DIAGNOSIS — M25511 Pain in right shoulder: Secondary | ICD-10-CM | POA: Diagnosis not present

## 2022-11-18 DIAGNOSIS — M19011 Primary osteoarthritis, right shoulder: Secondary | ICD-10-CM | POA: Diagnosis not present

## 2022-11-30 DIAGNOSIS — Z6827 Body mass index (BMI) 27.0-27.9, adult: Secondary | ICD-10-CM | POA: Diagnosis not present

## 2022-11-30 DIAGNOSIS — E559 Vitamin D deficiency, unspecified: Secondary | ICD-10-CM | POA: Diagnosis not present

## 2022-11-30 DIAGNOSIS — M25511 Pain in right shoulder: Secondary | ICD-10-CM | POA: Diagnosis not present

## 2022-11-30 DIAGNOSIS — E119 Type 2 diabetes mellitus without complications: Secondary | ICD-10-CM | POA: Diagnosis not present

## 2022-11-30 DIAGNOSIS — E782 Mixed hyperlipidemia: Secondary | ICD-10-CM | POA: Diagnosis not present

## 2022-12-02 DIAGNOSIS — M25511 Pain in right shoulder: Secondary | ICD-10-CM | POA: Diagnosis not present

## 2022-12-13 DIAGNOSIS — M25511 Pain in right shoulder: Secondary | ICD-10-CM | POA: Diagnosis not present

## 2022-12-23 DIAGNOSIS — M67911 Unspecified disorder of synovium and tendon, right shoulder: Secondary | ICD-10-CM | POA: Diagnosis not present

## 2022-12-23 DIAGNOSIS — M25511 Pain in right shoulder: Secondary | ICD-10-CM | POA: Diagnosis not present

## 2022-12-24 ENCOUNTER — Telehealth: Payer: Self-pay

## 2022-12-24 ENCOUNTER — Telehealth: Payer: Self-pay | Admitting: *Deleted

## 2022-12-24 NOTE — Telephone Encounter (Signed)
  Patient Consent for Virtual Visit        [name] has provided verbal consent on 12/24/2022 for a virtual visit (video or telephone).   CONSENT FOR VIRTUAL VISIT FOR:  [name]  By participating in this virtual visit I agree to the following:  I hereby voluntarily request, consent and authorize [facility] and its employed or contracted physicians, physician assistants, nurse practitioners or other licensed health care professionals (the Practitioner), to provide me with telemedicine health care services (the "Services") as deemed necessary by the treating Practitioner. I acknowledge and consent to receive the Services by the Practitioner via telemedicine. I understand that the telemedicine visit will involve communicating with the Practitioner through live audiovisual communication technology and the disclosure of certain medical information by electronic transmission. I acknowledge that I have been given the opportunity to request an in-person assessment or other available alternative prior to the telemedicine visit and am voluntarily participating in the telemedicine visit.  I understand that I have the right to withhold or withdraw my consent to the use of telemedicine in the course of my care at any time, without affecting my right to future care or treatment, and that the Practitioner or I may terminate the telemedicine visit at any time. I understand that I have the right to inspect all information obtained and/or recorded in the course of the telemedicine visit and may receive copies of available information for a reasonable fee.  I understand that some of the potential risks of receiving the Services via telemedicine include:  Delay or interruption in medical evaluation due to technological equipment failure or disruption; Information transmitted may not be sufficient (e.g. poor resolution of images) to allow for appropriate medical decision making by the  Practitioner; and/or  In rare instances, security protocols could fail, causing a breach of personal health information.  Furthermore, I acknowledge that it is my responsibility to provide information about my medical history, conditions and care that is complete and accurate to the best of my ability. I acknowledge that Practitioner's advice, recommendations, and/or decision may be based on factors not within their control, such as incomplete or inaccurate data provided by me or distortions of diagnostic images or specimens that may result from electronic transmissions. I understand that the practice of medicine is not an exact science and that Practitioner makes no warranties or guarantees regarding treatment outcomes. I acknowledge that a copy of this consent can be made available to me via my patient portal [name] MyChart), or I can request a printed copy by calling the office of  [facility].    I understand that my insurance will be billed for this visit.   I have read or had this consent read to me. I understand the contents of this consent, which adequately explains the benefits and risks of the Services being provided via telemedicine.  I have been provided ample opportunity to ask questions regarding this consent and the Services and have had my questions answered to my satisfaction. I give my informed consent for the services to be provided through the use of telemedicine in my medical care     {

## 2022-12-24 NOTE — Telephone Encounter (Signed)
   Name: [name]  DOB: 10-Dec-1955  MRN: [id number]  Primary Cardiologist: Chrystie Nose, MD  Chart reviewed as part of pre-operative protocol coverage. Because of [name]'s past medical history and time since last visit, she will require a follow-up telephone visit in order to better assess preoperative cardiovascular risk.  Pre-op covering staff: - Please schedule appointment and call patient to inform them. If patient already had an upcoming appointment within acceptable timeframe, please add "pre-op clearance" to the appointment notes so provider is aware. - Please contact requesting surgeon's office via preferred method (i.e, phone, fax) to inform them of need for appointment prior to surgery.  Aspirin can be held for 5 to 7 days prior to surgery and restart when medically safe to do so as long as patient is not symptomatic at the time of phone call.  Sharlene Dory, PA-C  12/24/2022, 4:45 PM

## 2022-12-24 NOTE — Telephone Encounter (Signed)
Pt med rec and consent done. Pt is scheduled for 07/30.

## 2022-12-24 NOTE — Telephone Encounter (Signed)
   Pre-operative Risk Assessment    Patient Name: [name]  DOB: [birth year] MRN: [id number]      Request for Surgical Clearance    Procedure:   RIGHT SHOULDER ARTHROSCOPY  Date of Surgery:  Clearance 01/19/23                                 Surgeon:  DR. Jodi Geralds Surgeon's Group or Practice Name:  [facility] Phone number:  [phone] ATTN: Emilio Aspen Fax number:  [phone]   Type of Clearance Requested:   - Medical ; ASA    Type of Anesthesia:   CHOICE   Additional requests/questions:    Elpidio Anis   12/24/2022, 2:58 PM

## 2023-01-11 ENCOUNTER — Ambulatory Visit: Payer: Medicare PPO | Attending: Cardiology

## 2023-01-11 DIAGNOSIS — Z01810 Encounter for preprocedural cardiovascular examination: Secondary | ICD-10-CM | POA: Diagnosis not present

## 2023-01-11 NOTE — Progress Notes (Signed)
Virtual Visit via Telephone Note   Because of [name]'s co-morbid illnesses, she is at least at moderate risk for complications without adequate follow up.  This format is felt to be most appropriate for this patient at this time.  The patient did not have access to video technology/had technical difficulties with video requiring transitioning to audio format only (telephone).  All issues noted in this document were discussed and addressed.  No physical exam could be performed with this format.  Please refer to the patient's chart for her consent to telehealth for [facility].  Evaluation Performed:  Preoperative cardiovascular risk assessment _____________   Date:  01/11/2023   Patient ID:  [name], DOB 01-10-1956, MRN [id number] Patient Location:  Home Provider location:   Office  Primary Care Provider:  Sigmund Hazel, MD Primary Cardiologist:  Chrystie Nose, MD  Chief Complaint / Patient Profile   67 y.o. y/o female with a h/o nonobstructive CAD, PVCs, AF, fatigue, anxiety who is pending right shoulder arthroscopy and presents today for telephonic preoperative cardiovascular risk assessment.  History of Present Illness    [name] is a 67 y.o. female who presents via audio/video conferencing for a telehealth visit today.  Pt was last seen in cardiology clinic on 03/10/2022 by Edd Fabian, NP.  At that time [name] was doing well with no new cardiac complaints.  The patient is now pending procedure as outlined above. Since her last visit, she has been doing well with no new cardiac complaints.  She denies chest pain, shortness of breath, lower extremity edema, fatigue, palpitations, melena, hematuria, hemoptysis, diaphoresis, weakness, presyncope, syncope, orthopnea, and PND.    Past Medical History    Past Medical History:  Diagnosis Date   ANXIETY 11/07/2009   Qualifier: Diagnosis of  By: Myrtie Hawk, Amy S - came after her  diagnosis of breast cancer   Breast CA (HCC)    right - radiation & surgery   Diabetes (HCC)    Type II, diet control   DIVERTICULOSIS, COLON 06/26/2004   Qualifier: Diagnosis of  By: Gwinda Passe RN, Carissa     DM2 (diabetes mellitus, type 2) (HCC) 11/07/2009   Qualifier: Diagnosis of  By: Myrtie Hawk, Amy S    Family history of heart disease    Fatty liver    Gallstone    GASTRIC POLYP 03/30/2007   Qualifier: Diagnosis of  By: Gwinda Passe RN, Carissa     Gastroparesis    mild   GERD 11/07/2009   Qualifier: Diagnosis of  By: Myrtie Hawk, Amy S    History of nuclear stress test 12/2011   bruce protocol; no evidence of ischemia, EF 79%, normal pattern of perfusion   Hyperchloremia    Hyperlipidemia    HYPERTHYROIDISM 11/07/2009   Qualifier: Diagnosis of  By: Monica Becton PA-c, Amy S    Irritable bowel syndrome 08/04/2007   Qualifier: Diagnosis of  By: Gwinda Passe RN, Carissa     MVP (mitral valve prolapse)    last echo 07/1997 - normal LV size/function, mild MVP, mild MR   Osteopenia    OVARIAN CYST 08/04/2007   Qualifier: Diagnosis of  By: Gwinda Passe RN, Cloria Spring     Personal history of radiation therapy    PVC (premature ventricular contraction)    Vitamin D deficiency    Past Surgical History:  Procedure Laterality Date   ABDOMINAL HYSTERECTOMY  03/2003   APPENDECTOMY  01/2002   BREAST LUMPECTOMY Right 2006  x2   CARDIAC CATHETERIZATION  10/13/2009   no significant fixed obstructive CAD, normral ramus intermediate/circumflex/RCA; normal LV function (Dr. Bishop Limbo)   CHOLECYSTECTOMY N/A 11/18/2020   Procedure: LAPAROSCOPIC CHOLECYSTECTOMY;  Surgeon: Axel Filler, MD;  Location: [facility] OR;  Service: General;  Laterality: N/A;   LAPAROSCOPIC OOPHERECTOMY     TONSILLECTOMY  1969    Allergies  Allergies  Allergen Reactions   Cocoa Other (See Comments)    migraine   Chocolate Other (See Comments)    migraine   Crestor [Rosuvastatin]     myalgias   Erythromycin Nausea And Vomiting    Morphine And Codeine Nausea And Vomiting   Amoxicillin Rash   Other Rash    Tape, bandaids   Sulfamethoxazole-Trimethoprim Nausea Only    Home Medications    Prior to Admission medications   Medication Sig Start Date End Date Taking? Authorizing Provider  Alum & Mag Hydroxide-Simeth (MYLANTA PO) Take 1 Dose by mouth as needed.    [provider]  aspirin EC 81 MG tablet Take 81 mg by mouth at bedtime.    [provider]  Calcium-Vitamin D-Vitamin K (VIACTIV CALCIUM PLUS D) 650-12.5-40 MG-MCG-MCG CHEW Chew 1 tablet by mouth in the morning and at bedtime.    [provider]  Cholecalciferol (VITAMIN D3 GUMMIES PO) Take 2 tablets by mouth in the morning.    [provider]  Cyanocobalamin (VITAMIN B-12 PO) Take 1,250 mcg by mouth in the morning.    [provider]  Dextrose-Fructose-Sod Citrate (NAUZENE PO) Take 2 tablets by mouth as needed.    [provider]  dicyclomine (BENTYL) 20 MG tablet Take 1-2 tablets every 4-6 hours as needed 10/28/21   Hilarie Fredrickson, MD  esomeprazole (NEXIUM) 40 MG capsule Take 1 capsule (40 mg total) by mouth in the morning and at bedtime. 02/08/22   Hilarie Fredrickson, MD  ezetimibe (ZETIA) 10 MG tablet Take 1 tablet (10 mg total) by mouth daily. 04/05/22   Hilty, Lisette Abu, MD  fenofibrate micronized (LOFIBRA) 134 MG capsule Take 1 capsule (134 mg total) by mouth daily before breakfast. 03/15/22 03/10/23  Ronney Asters, NP  flecainide (TAMBOCOR) 100 MG tablet Take 1 tablet (100 mg total) by mouth 2 (two) times daily. 05/11/22   Hilty, Lisette Abu, MD  fluticasone (FLONASE) 50 MCG/ACT nasal spray Place 1 spray into both nostrils in the morning.    [provider]  Glucosamine HCl (GLUCOSAMINE PO) Take 2,000 mg by mouth in the morning.    [provider]  hydrochlorothiazide (HYDRODIURIL) 25 MG tablet TAKE ONE TABLET BY MOUTH DAILY 07/20/22   Hilty, Lisette Abu, MD  hyoscyamine (LEVSIN SL) 0.125 MG SL  tablet PLACE 1-2 TABLETS UNDER TONGUE EVERY FOUR HOURS AS NEEDED FOR ABDOMINAL PAIN. 10/14/17   Hilarie Fredrickson, MD  metFORMIN (GLUCOPHAGE) 500 MG tablet Take 500 mg by mouth in the morning and at bedtime. 08/10/19   [provider]  Misc Natural Products (GLUCOSAMINE CHOND DOUBLE STR) TABS Take 1 tablet by mouth daily.    [provider]  Multiple Vitamins-Minerals (ADULT ONE DAILY GUMMIES PO) Take 2 tablets by mouth in the morning.    [provider]  nitrofurantoin, macrocrystal-monohydrate, (MACROBID) 100 MG capsule Take 1 capsule by mouth as needed. 10/13/21   [provider]  nitroGLYCERIN (NITROSTAT) 0.4 MG SL tablet Place 1 tablet (0.4 mg total) under the tongue every 5 (five) minutes as needed for chest pain. MAX 3 doses  01/12/18 03/10/22  Hilty, Lisette Abu, MD  ondansetron (ZOFRAN-ODT) 4 MG disintegrating tablet Take 1 tablet (4 mg total) by mouth every 4 (four) hours as needed for nausea or vomiting. 10/28/21   Hilarie Fredrickson, MD  simvastatin (ZOCOR) 20 MG tablet TAKE 1 TABLET AT 6PM. 03/22/22   Hilty, Lisette Abu, MD  VASCEPA 1 g capsule Take 2 capsules (2 g total) by mouth 2 (two) times daily. 04/30/22   Chrystie Nose, MD    Physical Exam    Vital Signs:  EMRA LIVSHITS does not have vital signs available for review today. blood pressures have been ranging in the 120/75  Given telephonic nature of communication, physical exam is limited. AAOx3. NAD. Normal affect.  Speech and respirations are unlabored.  Accessory Clinical Findings    None  Assessment & Plan    1.  Preoperative Cardiovascular Risk Assessment:  -Patient's RCRI score is 0.4% The patient affirms she has been doing well without any new cardiac symptoms. They are able to achieve 5 METS without cardiac limitations. Therefore, based on ACC/AHA guidelines, the patient would be at acceptable risk for the planned procedure without further cardiovascular testing. The patient was advised that if  she develops new symptoms prior to surgery to contact our office to arrange for a follow-up visit, and she verbalized understanding.   The patient was advised that if she develops new symptoms prior to surgery to contact our office to arrange for a follow-up visit, and she verbalized understanding.  Patient can hold aspirin for 5 to 7 days prior to procedure.  She will restart postprocedure when hemostasis is achieved and guided by the surgery team.  A copy of this note will be routed to requesting surgeon.  Time:   Today, I have spent 8 minutes with the patient with telehealth technology discussing medical history, symptoms, and management plan.     Napoleon Form, Leodis Rains, NP  01/11/2023, 7:17 AM

## 2023-01-14 ENCOUNTER — Other Ambulatory Visit: Payer: Self-pay | Admitting: Internal Medicine

## 2023-01-18 ENCOUNTER — Other Ambulatory Visit: Payer: Self-pay | Admitting: Internal Medicine

## 2023-01-19 DIAGNOSIS — M7551 Bursitis of right shoulder: Secondary | ICD-10-CM | POA: Diagnosis not present

## 2023-01-19 DIAGNOSIS — G8918 Other acute postprocedural pain: Secondary | ICD-10-CM | POA: Diagnosis not present

## 2023-01-19 DIAGNOSIS — M75111 Incomplete rotator cuff tear or rupture of right shoulder, not specified as traumatic: Secondary | ICD-10-CM | POA: Diagnosis not present

## 2023-01-19 DIAGNOSIS — M7581 Other shoulder lesions, right shoulder: Secondary | ICD-10-CM | POA: Diagnosis not present

## 2023-01-19 DIAGNOSIS — M7521 Bicipital tendinitis, right shoulder: Secondary | ICD-10-CM | POA: Diagnosis not present

## 2023-01-19 DIAGNOSIS — M7541 Impingement syndrome of right shoulder: Secondary | ICD-10-CM | POA: Diagnosis not present

## 2023-02-01 DIAGNOSIS — Z4789 Encounter for other orthopedic aftercare: Secondary | ICD-10-CM | POA: Diagnosis not present

## 2023-02-01 DIAGNOSIS — M25511 Pain in right shoulder: Secondary | ICD-10-CM | POA: Diagnosis not present

## 2023-02-04 DIAGNOSIS — M25511 Pain in right shoulder: Secondary | ICD-10-CM | POA: Diagnosis not present

## 2023-02-04 DIAGNOSIS — Z4789 Encounter for other orthopedic aftercare: Secondary | ICD-10-CM | POA: Diagnosis not present

## 2023-02-08 ENCOUNTER — Other Ambulatory Visit: Payer: Self-pay | Admitting: Internal Medicine

## 2023-02-08 DIAGNOSIS — M25511 Pain in right shoulder: Secondary | ICD-10-CM | POA: Diagnosis not present

## 2023-02-08 DIAGNOSIS — Z4789 Encounter for other orthopedic aftercare: Secondary | ICD-10-CM | POA: Diagnosis not present

## 2023-02-11 DIAGNOSIS — M25511 Pain in right shoulder: Secondary | ICD-10-CM | POA: Diagnosis not present

## 2023-02-11 DIAGNOSIS — Z4789 Encounter for other orthopedic aftercare: Secondary | ICD-10-CM | POA: Diagnosis not present

## 2023-02-15 DIAGNOSIS — M25511 Pain in right shoulder: Secondary | ICD-10-CM | POA: Diagnosis not present

## 2023-02-15 DIAGNOSIS — Z4789 Encounter for other orthopedic aftercare: Secondary | ICD-10-CM | POA: Diagnosis not present

## 2023-02-18 DIAGNOSIS — Z4789 Encounter for other orthopedic aftercare: Secondary | ICD-10-CM | POA: Diagnosis not present

## 2023-02-18 DIAGNOSIS — M25511 Pain in right shoulder: Secondary | ICD-10-CM | POA: Diagnosis not present

## 2023-02-21 DIAGNOSIS — Z1389 Encounter for screening for other disorder: Secondary | ICD-10-CM | POA: Diagnosis not present

## 2023-02-21 DIAGNOSIS — E559 Vitamin D deficiency, unspecified: Secondary | ICD-10-CM | POA: Diagnosis not present

## 2023-02-21 DIAGNOSIS — Z0000 Encounter for general adult medical examination without abnormal findings: Secondary | ICD-10-CM | POA: Diagnosis not present

## 2023-02-21 DIAGNOSIS — E663 Overweight: Secondary | ICD-10-CM | POA: Diagnosis not present

## 2023-02-21 DIAGNOSIS — E119 Type 2 diabetes mellitus without complications: Secondary | ICD-10-CM | POA: Diagnosis not present

## 2023-02-22 DIAGNOSIS — Z4789 Encounter for other orthopedic aftercare: Secondary | ICD-10-CM | POA: Diagnosis not present

## 2023-02-22 DIAGNOSIS — M25511 Pain in right shoulder: Secondary | ICD-10-CM | POA: Diagnosis not present

## 2023-02-24 DIAGNOSIS — Z96652 Presence of left artificial knee joint: Secondary | ICD-10-CM | POA: Diagnosis not present

## 2023-02-24 DIAGNOSIS — M25511 Pain in right shoulder: Secondary | ICD-10-CM | POA: Diagnosis not present

## 2023-02-24 DIAGNOSIS — Z4789 Encounter for other orthopedic aftercare: Secondary | ICD-10-CM | POA: Diagnosis not present

## 2023-02-25 DIAGNOSIS — M8580 Other specified disorders of bone density and structure, unspecified site: Secondary | ICD-10-CM | POA: Diagnosis not present

## 2023-02-25 DIAGNOSIS — E663 Overweight: Secondary | ICD-10-CM | POA: Diagnosis not present

## 2023-02-25 DIAGNOSIS — Z23 Encounter for immunization: Secondary | ICD-10-CM | POA: Diagnosis not present

## 2023-02-25 DIAGNOSIS — Z6826 Body mass index (BMI) 26.0-26.9, adult: Secondary | ICD-10-CM | POA: Diagnosis not present

## 2023-02-25 DIAGNOSIS — E119 Type 2 diabetes mellitus without complications: Secondary | ICD-10-CM | POA: Diagnosis not present

## 2023-02-25 DIAGNOSIS — E782 Mixed hyperlipidemia: Secondary | ICD-10-CM | POA: Diagnosis not present

## 2023-02-25 DIAGNOSIS — I10 Essential (primary) hypertension: Secondary | ICD-10-CM | POA: Diagnosis not present

## 2023-02-25 DIAGNOSIS — I2510 Atherosclerotic heart disease of native coronary artery without angina pectoris: Secondary | ICD-10-CM | POA: Diagnosis not present

## 2023-03-01 DIAGNOSIS — Z4789 Encounter for other orthopedic aftercare: Secondary | ICD-10-CM | POA: Diagnosis not present

## 2023-03-01 DIAGNOSIS — M25511 Pain in right shoulder: Secondary | ICD-10-CM | POA: Diagnosis not present

## 2023-03-04 ENCOUNTER — Other Ambulatory Visit: Payer: Self-pay

## 2023-03-04 DIAGNOSIS — M8580 Other specified disorders of bone density and structure, unspecified site: Secondary | ICD-10-CM

## 2023-03-04 DIAGNOSIS — I25119 Atherosclerotic heart disease of native coronary artery with unspecified angina pectoris: Secondary | ICD-10-CM

## 2023-03-04 DIAGNOSIS — E785 Hyperlipidemia, unspecified: Secondary | ICD-10-CM | POA: Diagnosis not present

## 2023-03-04 DIAGNOSIS — M25511 Pain in right shoulder: Secondary | ICD-10-CM | POA: Diagnosis not present

## 2023-03-04 DIAGNOSIS — Z4789 Encounter for other orthopedic aftercare: Secondary | ICD-10-CM | POA: Diagnosis not present

## 2023-03-05 LAB — LIPID PANEL
Chol/HDL Ratio: 3.5 ratio (ref 0.0–4.4)
Cholesterol, Total: 120 mg/dL (ref 100–199)
HDL: 34 mg/dL — ABNORMAL LOW (ref >39)
LDL Chol Calc (NIH): 62 mg/dL (ref 0–99)
Triglycerides: 137 mg/dL (ref 0–149)
VLDL Cholesterol Cal: 24 mg/dL (ref 5–40)

## 2023-03-05 LAB — HEPATIC FUNCTION PANEL
ALT: 32 IU/L (ref 0–32)
AST: 25 IU/L (ref 0–40)
Albumin: 4.4 g/dL (ref 3.9–4.9)
Alkaline Phosphatase: 70 IU/L (ref 44–121)
Bilirubin Total: 0.4 mg/dL (ref 0.0–1.2)
Bilirubin, Direct: 0.11 mg/dL (ref 0.00–0.40)
Total Protein: 6.5 g/dL (ref 6.0–8.5)

## 2023-03-08 DIAGNOSIS — M25511 Pain in right shoulder: Secondary | ICD-10-CM | POA: Diagnosis not present

## 2023-03-08 DIAGNOSIS — Z4789 Encounter for other orthopedic aftercare: Secondary | ICD-10-CM | POA: Diagnosis not present

## 2023-03-10 NOTE — Progress Notes (Signed)
Cardiology Office Note    Patient Name: [name] ZOXWRUE Date of Encounter: 03/11/2023  Primary Care Provider:  Sigmund Hazel, MD Primary Cardiologist:  Chrystie Nose, MD Primary Electrophysiologist: None   Past Medical History    Past Medical History:  Diagnosis Date   ANXIETY 11/07/2009   Qualifier: Diagnosis of  By: Myrtie Hawk, Amy S - came after her diagnosis of breast cancer   Breast CA (HCC)    right - radiation & surgery   Diabetes (HCC)    Type II, diet control   DIVERTICULOSIS, COLON 06/26/2004   Qualifier: Diagnosis of  By: Gwinda Passe RN, Carissa     DM2 (diabetes mellitus, type 2) (HCC) 11/07/2009   Qualifier: Diagnosis of  By: Myrtie Hawk, Amy S    Family history of heart disease    Fatty liver    Gallstone    GASTRIC POLYP 03/30/2007   Qualifier: Diagnosis of  By: Gwinda Passe RN, Carissa     Gastroparesis    mild   GERD 11/07/2009   Qualifier: Diagnosis of  By: Myrtie Hawk, Amy S    History of nuclear stress test 12/2011   bruce protocol; no evidence of ischemia, EF 79%, normal pattern of perfusion   Hyperchloremia    Hyperlipidemia    HYPERTHYROIDISM 11/07/2009   Qualifier: Diagnosis of  By: Monica Becton PA-c, Amy S    Irritable bowel syndrome 08/04/2007   Qualifier: Diagnosis of  By: Gwinda Passe RN, Carissa     MVP (mitral valve prolapse)    last echo 07/1997 - normal LV size/function, mild MVP, mild MR   Osteopenia    OVARIAN CYST 08/04/2007   Qualifier: Diagnosis of  By: Gwinda Passe RN, Cloria Spring     Personal history of radiation therapy    PVC (premature ventricular contraction)    Vitamin D deficiency     History of Present Illness  [name] is a 67 y.o. female with a PMH of CAD,PVCs, DM type II, HLD, breast cancer treated with chemotherapy, hypothyroidism, MVP who presents today for 1 year follow-up.   [name] been followed by Dr. Rennis Golden since 2014 with the above-mentioned past medical history.  She was previously followed by Dr. [name]  and underwent a LHC 2011 which showed no CAD.  She was seen in 2014 and reported bouts of symptomatic PVCs.  She wore an event monitor and was referred to electrophysiology and seen by Dr. Ladona Ridgel.  She was placed on flecainide had improvement to PVCs.  She completed a coronary CTA that showed mild nonobstructive disease with 25 to 50% noted in the distal left main and ostial circumflex with a calcium score of 161.  She completed her last ischemic evaluation in 2021 by [facility] that showed no ischemia and normal LV function.She was last seen by Dr. Rennis Golden on 11/27/2020 and reported feeling well overall.  She underwent a cholecystectomy and reported weight loss due to change in her diet.  Blood pressures were well-controlled and EKG shows sinus rhythm.  She was seen for surgical clearance on 03/10/2022 for clearance of left total knee arthroplasty that was granted.  She recently underwent a right shoulder arthroscopy.  During today's visit the patient reports that she [name] been doing well with no new cardiac complaints or concerns since her previous follow-up.  Her blood pressure today was well-controlled at 110/78 and heart rate was 60 bpm.  She is compliant with her current medications and denies any adverse reactions.  She underwent a total  knee replacement and shoulder surgery with no complications.  She continues to stay active and follows a low-fat diet with a goal of losing 10 pounds in the future.  She denies any significant PVCs but does note occasional episodes with increased stress.  Patient denies chest pain, palpitations, dyspnea, PND, orthopnea, nausea, vomiting, dizziness, syncope, edema, weight gain, or early satiety.  Review of Systems  Please see the history of present illness.    All other systems reviewed and are otherwise negative except as noted above.  Physical Exam    Wt Readings from Last 3 Encounters:  03/11/23 142 lb (64.4 kg)  03/10/22 144 lb 12.8 oz (65.7 kg)  11/19/21 147  lb (66.7 kg)   VS: Vitals:   03/11/23 0910  BP: 110/78  Pulse: 60  SpO2: 96%  ,Body mass index is 26.4 kg/m. GEN: Well nourished, well developed in no acute distress Neck: No JVD; No carotid bruits Pulmonary: Clear to auscultation without rales, wheezing or rhonchi  Cardiovascular: Normal rate. Regular rhythm. Normal S1. Normal S2.   Murmurs: There is no murmur.  ABDOMEN: Soft, non-tender, non-distended EXTREMITIES:  No edema; No deformity   EKG/LABS/ Recent Cardiac Studies   ECG personally reviewed by me today -sinus rhythm with rate of 60 bpm and no acute changes consistent with previous EKG.  Risk Assessment/Calculations:          Lab Results  Component Value Date   WBC 17.0 (H) 07/18/2021   HGB 15.5 (H) 07/18/2021   HCT 45.3 07/18/2021   MCV 92.6 07/18/2021   PLT 333 07/18/2021   Lab Results  Component Value Date   CREATININE 0.72 07/18/2021   BUN 20 07/18/2021   NA 138 07/18/2021   K 3.7 07/18/2021   CL 100 07/18/2021   CO2 25 07/18/2021   Lab Results  Component Value Date   CHOL 120 03/04/2023   HDL 34 (L) 03/04/2023   LDLCALC 62 03/04/2023   TRIG 137 03/04/2023   CHOLHDL 3.5 03/04/2023    Lab Results  Component Value Date   HGBA1C (H) 10/12/2009    5.7 (NOTE)                                                                       According to the ADA Clinical Practice Recommendations for 2011, when HbA1c is used as a screening test:   >=6.5%   Diagnostic of Diabetes Mellitus           (if abnormal result  is confirmed)  5.7-6.4%   Increased risk of developing Diabetes Mellitus  References:Diagnosis and Classification of Diabetes Mellitus,Diabetes Care,2011,34(Suppl 1):S62-S69 and Standards of Medical Care in         Diabetes - 2011,Diabetes Care,2011,34  (Suppl 1):S11-S61.   Assessment & Plan    1.  Coronary artery disease: -Coronary CTA 12/2017 with mild to moderate calcification and ostial/proximal LAD most recent ischemic evaluation completed 2021  by Steffanie Dunn that was low risk -Today patient reports no chest pain or anginal complaints. -Continue ASA milligrams, Zetia 10 mg and Nitrostat 0.4 mg -Continue low-sodium heart healthy diet  2.  PVCs: -Currently controlled with flecainide 100 mg twice daily followed by EP -Today patient reports palpitations only occurring with increased  stress but otherwise well-controlled  3.  Hyperlipidemia: -Patient's last LDL cholesterol was 62 at goal of less than 70 -Continue ezetimibe and atorvastatin as noted above  4.  GERD: -Patient followed by gastroenterology -Reports no acute illnesses or gastritis attacks  Disposition: Follow-up with Chrystie Nose, MD or APP in 12 months    Signed, Napoleon Form, Leodis Rains, NP 03/11/2023, 9:48 AM [facility]

## 2023-03-11 ENCOUNTER — Encounter: Payer: Self-pay | Admitting: Nurse Practitioner

## 2023-03-11 ENCOUNTER — Ambulatory Visit: Payer: Medicare PPO | Attending: Nurse Practitioner | Admitting: Nurse Practitioner

## 2023-03-11 VITALS — BP 110/78 | HR 60 | Ht 61.5 in | Wt 142.0 lb

## 2023-03-11 DIAGNOSIS — I25119 Atherosclerotic heart disease of native coronary artery with unspecified angina pectoris: Secondary | ICD-10-CM | POA: Diagnosis not present

## 2023-03-11 DIAGNOSIS — K219 Gastro-esophageal reflux disease without esophagitis: Secondary | ICD-10-CM | POA: Diagnosis not present

## 2023-03-11 DIAGNOSIS — I493 Ventricular premature depolarization: Secondary | ICD-10-CM | POA: Diagnosis not present

## 2023-03-11 DIAGNOSIS — E785 Hyperlipidemia, unspecified: Secondary | ICD-10-CM

## 2023-03-11 MED ORDER — NITROGLYCERIN 0.4 MG SL SUBL
0.4000 mg | SUBLINGUAL_TABLET | SUBLINGUAL | 2 refills | Status: DC | PRN
Start: 2023-03-11 — End: 2024-02-14

## 2023-03-11 NOTE — Patient Instructions (Signed)
Medication Instructions:  Your physician recommends that you continue on your current medications as directed. Please refer to the Current Medication list given to you today. *If you need a refill on your cardiac medications before your next appointment, please call your pharmacy*   Lab Work: None Ordered   Testing/Procedures: Your physician has requested that you have an echocardiogram. Echocardiography is a painless test that uses sound waves to create images of your heart. It provides your doctor with information about the size and shape of your heart and how well your heart's chambers and valves are working. This procedure takes approximately one hour. There are no restrictions for this procedure. Please do NOT wear cologne, perfume, aftershave, or lotions (deodorant is allowed). Please arrive 15 minutes prior to your appointment time.   Follow-Up: At [facility], you and your health needs are our priority.  As part of our continuing mission to provide you with exceptional heart care, we have created designated Provider Care Teams.  These Care Teams include your primary Cardiologist (physician) and Advanced Practice Providers (APPs -  Physician Assistants and Nurse Practitioners) who all work together to provide you with the care you need, when you need it.  We recommend signing up for the patient portal called "MyChart".  Sign up information is provided on this After Visit Summary.  MyChart is used to connect with patients for Virtual Visits (Telemedicine).  Patients are able to view lab/test results, encounter notes, upcoming appointments, etc.  Non-urgent messages can be sent to your provider as well.   To learn more about what you can do with MyChart, go to [email or web address].    Your next appointment:   12 month(s)  Provider:   Chrystie Nose, MD     Other Instructions

## 2023-03-15 DIAGNOSIS — M25511 Pain in right shoulder: Secondary | ICD-10-CM | POA: Diagnosis not present

## 2023-03-15 DIAGNOSIS — Z4789 Encounter for other orthopedic aftercare: Secondary | ICD-10-CM | POA: Diagnosis not present

## 2023-03-16 ENCOUNTER — Ambulatory Visit (HOSPITAL_BASED_OUTPATIENT_CLINIC_OR_DEPARTMENT_OTHER): Payer: Medicare PPO | Admitting: Nurse Practitioner

## 2023-03-18 DIAGNOSIS — Z4789 Encounter for other orthopedic aftercare: Secondary | ICD-10-CM | POA: Diagnosis not present

## 2023-03-18 DIAGNOSIS — M25511 Pain in right shoulder: Secondary | ICD-10-CM | POA: Diagnosis not present

## 2023-03-22 ENCOUNTER — Other Ambulatory Visit: Payer: Self-pay | Admitting: Internal Medicine

## 2023-03-22 DIAGNOSIS — Z4789 Encounter for other orthopedic aftercare: Secondary | ICD-10-CM | POA: Diagnosis not present

## 2023-03-22 DIAGNOSIS — M25511 Pain in right shoulder: Secondary | ICD-10-CM | POA: Diagnosis not present

## 2023-03-22 DIAGNOSIS — H0100A Unspecified blepharitis right eye, upper and lower eyelids: Secondary | ICD-10-CM | POA: Diagnosis not present

## 2023-03-22 DIAGNOSIS — H04123 Dry eye syndrome of bilateral lacrimal glands: Secondary | ICD-10-CM | POA: Diagnosis not present

## 2023-03-22 DIAGNOSIS — H0100B Unspecified blepharitis left eye, upper and lower eyelids: Secondary | ICD-10-CM | POA: Diagnosis not present

## 2023-03-30 DIAGNOSIS — Z4789 Encounter for other orthopedic aftercare: Secondary | ICD-10-CM | POA: Diagnosis not present

## 2023-03-30 DIAGNOSIS — M25511 Pain in right shoulder: Secondary | ICD-10-CM | POA: Diagnosis not present

## 2023-03-31 ENCOUNTER — Ambulatory Visit (HOSPITAL_COMMUNITY): Payer: Medicare PPO | Attending: Cardiology

## 2023-03-31 ENCOUNTER — Other Ambulatory Visit: Payer: Self-pay | Admitting: Internal Medicine

## 2023-03-31 DIAGNOSIS — E785 Hyperlipidemia, unspecified: Secondary | ICD-10-CM | POA: Diagnosis not present

## 2023-03-31 DIAGNOSIS — K219 Gastro-esophageal reflux disease without esophagitis: Secondary | ICD-10-CM | POA: Diagnosis not present

## 2023-03-31 DIAGNOSIS — I25119 Atherosclerotic heart disease of native coronary artery with unspecified angina pectoris: Secondary | ICD-10-CM | POA: Diagnosis not present

## 2023-03-31 DIAGNOSIS — I493 Ventricular premature depolarization: Secondary | ICD-10-CM | POA: Diagnosis not present

## 2023-03-31 LAB — ECHOCARDIOGRAM COMPLETE
Area-P 1/2: 2.81 cm2
P 1/2 time: 415 ms
S' Lateral: 2.9 cm

## 2023-04-05 DIAGNOSIS — M25511 Pain in right shoulder: Secondary | ICD-10-CM | POA: Diagnosis not present

## 2023-04-05 DIAGNOSIS — Z4789 Encounter for other orthopedic aftercare: Secondary | ICD-10-CM | POA: Diagnosis not present

## 2023-04-07 DIAGNOSIS — M7062 Trochanteric bursitis, left hip: Secondary | ICD-10-CM | POA: Diagnosis not present

## 2023-04-07 DIAGNOSIS — M545 Low back pain, unspecified: Secondary | ICD-10-CM | POA: Diagnosis not present

## 2023-04-07 DIAGNOSIS — M67911 Unspecified disorder of synovium and tendon, right shoulder: Secondary | ICD-10-CM | POA: Diagnosis not present

## 2023-04-15 DIAGNOSIS — M79671 Pain in right foot: Secondary | ICD-10-CM | POA: Diagnosis not present

## 2023-04-29 DIAGNOSIS — M79671 Pain in right foot: Secondary | ICD-10-CM | POA: Diagnosis not present

## 2023-05-09 ENCOUNTER — Encounter (HOSPITAL_BASED_OUTPATIENT_CLINIC_OR_DEPARTMENT_OTHER): Payer: Self-pay | Admitting: Internal Medicine

## 2023-05-09 ENCOUNTER — Other Ambulatory Visit: Payer: Self-pay

## 2023-05-09 ENCOUNTER — Other Ambulatory Visit: Payer: Self-pay | Admitting: Internal Medicine

## 2023-05-09 MED ORDER — FLECAINIDE ACETATE 100 MG PO TABS: 100.0000 mg | ORAL_TABLET | Freq: Two times a day (BID) | ORAL | 3 refills | Status: DC

## 2023-05-09 MED ORDER — VASCEPA 1 G PO CAPS: 2.0000 g | ORAL_CAPSULE | Freq: Two times a day (BID) | ORAL | 3 refills | Status: DC

## 2023-05-16 DIAGNOSIS — Z1231 Encounter for screening mammogram for malignant neoplasm of breast: Secondary | ICD-10-CM | POA: Diagnosis not present

## 2023-05-24 DIAGNOSIS — M7062 Trochanteric bursitis, left hip: Secondary | ICD-10-CM | POA: Diagnosis not present

## 2023-06-24 DIAGNOSIS — Z961 Presence of intraocular lens: Secondary | ICD-10-CM | POA: Diagnosis not present

## 2023-06-24 DIAGNOSIS — E119 Type 2 diabetes mellitus without complications: Secondary | ICD-10-CM | POA: Diagnosis not present

## 2023-06-24 DIAGNOSIS — H43813 Vitreous degeneration, bilateral: Secondary | ICD-10-CM | POA: Diagnosis not present

## 2023-06-27 ENCOUNTER — Ambulatory Visit
Admission: RE | Admit: 2023-06-27 | Discharge: 2023-06-27 | Disposition: A | Discharge status: Home / Self Care | Payer: Medicare PPO | Source: Ambulatory Visit | Attending: Family Medicine | Admitting: Family Medicine

## 2023-06-27 DIAGNOSIS — E2839 Other primary ovarian failure: Secondary | ICD-10-CM | POA: Diagnosis not present

## 2023-06-27 DIAGNOSIS — Z90722 Acquired absence of ovaries, bilateral: Secondary | ICD-10-CM | POA: Diagnosis not present

## 2023-06-27 DIAGNOSIS — M8588 Other specified disorders of bone density and structure, other site: Secondary | ICD-10-CM | POA: Diagnosis not present

## 2023-06-27 DIAGNOSIS — N958 Other specified menopausal and perimenopausal disorders: Secondary | ICD-10-CM | POA: Diagnosis not present

## 2023-06-28 DIAGNOSIS — M7062 Trochanteric bursitis, left hip: Secondary | ICD-10-CM | POA: Diagnosis not present

## 2023-07-25 DIAGNOSIS — R519 Headache, unspecified: Secondary | ICD-10-CM | POA: Diagnosis not present

## 2023-07-25 DIAGNOSIS — R29810 Facial weakness: Secondary | ICD-10-CM | POA: Diagnosis not present

## 2023-07-29 ENCOUNTER — Other Ambulatory Visit: Payer: Self-pay | Admitting: Family Medicine

## 2023-07-29 DIAGNOSIS — R519 Headache, unspecified: Secondary | ICD-10-CM

## 2023-08-16 ENCOUNTER — Ambulatory Visit
Admission: RE | Admit: 2023-08-16 | Discharge: 2023-08-16 | Disposition: A | Discharge status: Home / Self Care | Payer: Medicare PPO | Source: Ambulatory Visit | Attending: Family Medicine | Admitting: Family Medicine

## 2023-08-16 DIAGNOSIS — R519 Headache, unspecified: Secondary | ICD-10-CM | POA: Diagnosis not present

## 2023-08-16 MED ORDER — GADOPICLENOL 0.5 MMOL/ML IV SOLN
7.0000 mL | Freq: Once | INTRAVENOUS | Status: AC | PRN
  Administered 2023-08-16: 7 mL via INTRAVENOUS

## 2023-08-22 DIAGNOSIS — K2100 Gastro-esophageal reflux disease with esophagitis, without bleeding: Secondary | ICD-10-CM | POA: Diagnosis not present

## 2023-08-22 DIAGNOSIS — E1159 Type 2 diabetes mellitus with other circulatory complications: Secondary | ICD-10-CM | POA: Diagnosis not present

## 2023-08-22 DIAGNOSIS — F5101 Primary insomnia: Secondary | ICD-10-CM | POA: Diagnosis not present

## 2023-09-01 DIAGNOSIS — M7062 Trochanteric bursitis, left hip: Secondary | ICD-10-CM | POA: Diagnosis not present

## 2023-09-01 DIAGNOSIS — M25552 Pain in left hip: Secondary | ICD-10-CM | POA: Diagnosis not present

## 2023-09-09 DIAGNOSIS — R030 Elevated blood-pressure reading, without diagnosis of hypertension: Secondary | ICD-10-CM | POA: Diagnosis not present

## 2023-09-09 DIAGNOSIS — J0140 Acute pansinusitis, unspecified: Secondary | ICD-10-CM | POA: Diagnosis not present

## 2023-09-09 DIAGNOSIS — Z6826 Body mass index (BMI) 26.0-26.9, adult: Secondary | ICD-10-CM | POA: Diagnosis not present

## 2023-09-22 DIAGNOSIS — M7062 Trochanteric bursitis, left hip: Secondary | ICD-10-CM | POA: Diagnosis not present

## 2023-09-27 ENCOUNTER — Other Ambulatory Visit: Payer: Self-pay | Admitting: Internal Medicine

## 2023-11-09 ENCOUNTER — Encounter (HOSPITAL_BASED_OUTPATIENT_CLINIC_OR_DEPARTMENT_OTHER): Payer: Self-pay | Admitting: Internal Medicine

## 2023-11-09 ENCOUNTER — Other Ambulatory Visit (HOSPITAL_BASED_OUTPATIENT_CLINIC_OR_DEPARTMENT_OTHER): Payer: Self-pay

## 2023-11-09 MED ORDER — VASCEPA 1 G PO CAPS
2.0000 g | ORAL_CAPSULE | Freq: Two times a day (BID) | ORAL | 3 refills | Status: AC
  Filled 2023-11-09 – 2023-11-10 (×3): qty 360, 90d supply, fill #0
  Filled 2024-02-01: qty 360, 90d supply, fill #1
  Filled 2024-05-01: qty 360, 90d supply, fill #2

## 2023-11-09 NOTE — Telephone Encounter (Signed)
 Spoke with patient, she would like to get set up on [facility] for next fill of Vascepa   Sent to pharmacy as requested

## 2023-11-10 ENCOUNTER — Encounter: Payer: Self-pay | Admitting: Pharmacist

## 2023-11-10 ENCOUNTER — Other Ambulatory Visit: Payer: Self-pay

## 2023-11-10 ENCOUNTER — Other Ambulatory Visit (HOSPITAL_BASED_OUTPATIENT_CLINIC_OR_DEPARTMENT_OTHER): Payer: Self-pay

## 2023-11-10 ENCOUNTER — Other Ambulatory Visit (HOSPITAL_COMMUNITY): Payer: Self-pay

## 2024-01-10 ENCOUNTER — Other Ambulatory Visit: Payer: Self-pay | Admitting: Internal Medicine

## 2024-01-19 ENCOUNTER — Other Ambulatory Visit (HOSPITAL_BASED_OUTPATIENT_CLINIC_OR_DEPARTMENT_OTHER): Payer: Self-pay

## 2024-01-19 DIAGNOSIS — J342 Deviated nasal septum: Secondary | ICD-10-CM | POA: Diagnosis not present

## 2024-01-19 DIAGNOSIS — R519 Headache, unspecified: Secondary | ICD-10-CM | POA: Diagnosis not present

## 2024-01-19 DIAGNOSIS — R0981 Nasal congestion: Secondary | ICD-10-CM | POA: Diagnosis not present

## 2024-01-19 DIAGNOSIS — J309 Allergic rhinitis, unspecified: Secondary | ICD-10-CM | POA: Diagnosis not present

## 2024-01-19 MED ORDER — FLECAINIDE ACETATE 100 MG PO TABS
100.0000 mg | ORAL_TABLET | Freq: Two times a day (BID) | ORAL | 3 refills | Status: DC
  Filled 2024-02-02: qty 180, 90d supply, fill #0

## 2024-01-19 MED ORDER — ESOMEPRAZOLE MAGNESIUM 40 MG PO CPDR
40.0000 mg | DELAYED_RELEASE_CAPSULE | Freq: Two times a day (BID) | ORAL | 2 refills | Status: DC
  Filled 2024-01-25 (×2): qty 60, 30d supply, fill #0

## 2024-01-19 MED ORDER — MELOXICAM 15 MG PO TABS
15.0000 mg | ORAL_TABLET | Freq: Every day | ORAL | 1 refills | Status: DC
Start: 2023-04-07 — End: 2024-02-14

## 2024-01-19 MED ORDER — METFORMIN HCL 500 MG PO TABS: 500.0000 mg | ORAL_TABLET | Freq: Every day | ORAL | 1 refills | Status: DC

## 2024-01-19 MED ORDER — NITROGLYCERIN 0.4 MG SL SUBL
0.4000 mg | SUBLINGUAL_TABLET | Freq: Every day | SUBLINGUAL | 2 refills | Status: AC
  Filled 2024-03-05: qty 25, 25d supply, fill #0

## 2024-01-25 ENCOUNTER — Other Ambulatory Visit (HOSPITAL_BASED_OUTPATIENT_CLINIC_OR_DEPARTMENT_OTHER): Payer: Self-pay

## 2024-01-25 MED ORDER — CLINDAMYCIN HCL 150 MG PO CAPS
600.0000 mg | ORAL_CAPSULE | ORAL | 1 refills | Status: DC
  Filled 2024-01-25 (×2): qty 12, 3d supply, fill #0
  Filled 2024-01-26 – 2024-01-28 (×3): qty 12, 3d supply, fill #1

## 2024-01-26 ENCOUNTER — Other Ambulatory Visit (HOSPITAL_COMMUNITY): Payer: Self-pay

## 2024-01-27 ENCOUNTER — Other Ambulatory Visit (HOSPITAL_COMMUNITY): Payer: Self-pay

## 2024-01-31 ENCOUNTER — Other Ambulatory Visit (HOSPITAL_BASED_OUTPATIENT_CLINIC_OR_DEPARTMENT_OTHER): Payer: Self-pay

## 2024-02-01 ENCOUNTER — Other Ambulatory Visit (HOSPITAL_COMMUNITY): Payer: Self-pay

## 2024-02-01 ENCOUNTER — Other Ambulatory Visit: Payer: Self-pay

## 2024-02-02 ENCOUNTER — Other Ambulatory Visit: Payer: Self-pay

## 2024-02-02 ENCOUNTER — Other Ambulatory Visit (HOSPITAL_COMMUNITY): Payer: Self-pay

## 2024-02-14 ENCOUNTER — Ambulatory Visit: Admitting: Neurology

## 2024-02-14 ENCOUNTER — Encounter: Payer: Self-pay | Admitting: Neurology

## 2024-02-14 VITALS — BP 166/92 | HR 68 | Ht 61.5 in | Wt 148.0 lb

## 2024-02-14 DIAGNOSIS — G43001 Migraine without aura, not intractable, with status migrainosus: Secondary | ICD-10-CM | POA: Diagnosis not present

## 2024-02-14 DIAGNOSIS — H02401 Unspecified ptosis of right eyelid: Secondary | ICD-10-CM | POA: Diagnosis not present

## 2024-02-14 DIAGNOSIS — Z7984 Long term (current) use of oral hypoglycemic drugs: Secondary | ICD-10-CM | POA: Diagnosis not present

## 2024-02-14 DIAGNOSIS — E1122 Type 2 diabetes mellitus with diabetic chronic kidney disease: Secondary | ICD-10-CM | POA: Diagnosis not present

## 2024-02-14 DIAGNOSIS — N181 Chronic kidney disease, stage 1: Secondary | ICD-10-CM | POA: Diagnosis not present

## 2024-02-14 DIAGNOSIS — E878 Other disorders of electrolyte and fluid balance, not elsewhere classified: Secondary | ICD-10-CM | POA: Diagnosis not present

## 2024-02-14 DIAGNOSIS — G501 Atypical facial pain: Secondary | ICD-10-CM | POA: Diagnosis not present

## 2024-02-14 NOTE — Progress Notes (Signed)
 [facility]  Provider:  Dr Chalice Referring Provider: Skotnicki, Meghan A, DO Primary Care Physician:  Cleotilde Planas, MD  Chief Complaint  Patient presents with   New Patient (Initial Visit)    Pt alone, rm 2. Around October ewnt to see eye MD because felt like she had pressure behind the right eye. Upon waking up had difficulty with getting the eye to open. He felt like this was a neuro issue. In march saw PCPthe right side of head feels like there is pressure and dull pain that stays on right side of the head. MRI/MRA brain c/o march and this was all ok. She had a migraine and dizzy feeling. Took 3-4 days to overcome. Still has swimmy head   Other    ENT evaluated first and they referred to neuro.     HPI:  [name] is a 68 y.o. female and seen here on 02/14/2024 upon referral from Dr. Llewellyn for a Consultation/ Evaluation of eye lip weakness, ptosis and headaches. .  This patient reports onset of HA and eye lid closure / opening  over a period of 10 months.    Problems with right eye opening onset in October 2024,  ophthalmologist stated this is neurologic, PCP did MRI brain- negative for stroke, ENT evaluated for sinus disease, scoped and declared normal.  Just a deviated septum.   Dull headaches on the right side, pressure, full feeling,  behind the eye.  And the right eye still doesn't want to fully open up.  In August 2025 she had experienced a migraine ( she has  had  only 2-3 Migraines in her whole lifetime ) .  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  She retired  2016 and  had already started [employer] had  graduated in 2013 , now in [employer] , [employer].  Social : non smoker, no nicotine, 3 glasses of wine a month. No recreational drug use.  Married, retired and yet still working.  2 adult children in their 51 s with 5 grandchildren, age 63 through 43. .   She had cancer  19 years  ago, breast cancer right side.   Dx with pansinusitis in march , this dx s is no longer present.   Tonsillectomy age 32 . But continued to have sinusitis.  DM 2  dx in 2020.  Had Covid in 2020 and  fever with high temperatures, sick, colitis, and high blood sugar.  Swimmers ear bilateral.    Review of Systems: Out of a complete 14 system review, the patient complains of only the following symptoms, and all other reviewed systems are negative.  See above   Social History   Socioeconomic History   Marital status: Married    Spouse name: Not on file   Number of children: 2   Years of education: bachelors   Highest education level: Not on file  Occupational History    Employer: [employer]  Tobacco Use   Smoking status: Never   Smokeless tobacco: Never  Vaping Use   Vaping status: Never Used  Substance and Sexual Activity   Alcohol use: Yes    Comment: occasionally   Drug use: No   Sexual activity: Not on file  Other Topics Concern   Not on file  Social History Narrative   Not on file   Social Drivers of Health   Financial Resource Strain: Not on file  Food  Insecurity: Not on file  Transportation Needs: Not on file  Physical Activity: Not on file  Stress: Not on file  Social Connections: Not on file  Intimate Partner Violence: Not on file    Family History  Problem Relation Age of Onset   Diabetes Maternal Grandmother    Heart failure Maternal Grandmother    Emphysema Maternal Grandfather    Breast cancer Paternal Grandmother    Heart disease Paternal Grandfather    Alzheimer's disease Mother    Heart failure Father    Hypertension Father        also murmur, hyperlipidemia, MI, CABG age 11   Diabetes Brother    Heart disease Brother 28       MI at 78   Hypertension Brother    Hyperlipidemia Sister    Breast cancer Maternal Aunt        PVCs   Asthma Child        x2   Colon cancer Neg Hx    Colon polyps Neg Hx    Stomach cancer Neg Hx    Esophageal  cancer Neg Hx     Past Medical History:  Diagnosis Date   ANXIETY 11/07/2009   Qualifier: Diagnosis of  By: Ever PA-c, Amy S - came after her diagnosis of breast cancer   Breast CA (HCC)    right - radiation & surgery   Diabetes (HCC)    Type II, diet control   DIVERTICULOSIS, COLON 06/26/2004   Qualifier: Diagnosis of  By: Myriam RN, Carissa     DM2 (diabetes mellitus, type 2) (HCC) 11/07/2009   Qualifier: Diagnosis of  By: Ever Riggers, Amy S    Family history of heart disease    Fatty liver    Gallstone    GASTRIC POLYP 03/30/2007   Qualifier: Diagnosis of  By: Myriam RN, Carissa     Gastroparesis    mild   GERD 11/07/2009   Qualifier: Diagnosis of  By: Ever Riggers, Amy S    History of nuclear stress test 12/2011   bruce protocol; no evidence of ischemia, EF 79%, normal pattern of perfusion   Hyperchloremia    Hyperlipidemia    HYPERTHYROIDISM 11/07/2009   Qualifier: Diagnosis of  By: Ever PA-c, Amy S    Irritable bowel syndrome 08/04/2007   Qualifier: Diagnosis of  By: Myriam RN, Carissa     MVP (mitral valve prolapse)    last echo 07/1997 - normal LV size/function, mild MVP, mild MR   Osteopenia    OVARIAN CYST 08/04/2007   Qualifier: Diagnosis of  By: Myriam RN, Maudie     Personal history of radiation therapy    PVC (premature ventricular contraction)    Vitamin D  deficiency     Past Surgical History:  Procedure Laterality Date   ABDOMINAL HYSTERECTOMY  03/2003   APPENDECTOMY  01/2002   BREAST LUMPECTOMY Right 2006   x2   CARDIAC CATHETERIZATION  10/13/2009   no significant fixed obstructive CAD, normral ramus intermediate/circumflex/RCA; normal LV function (Dr. IVAR Sor)   CHOLECYSTECTOMY N/A 11/18/2020   Procedure: LAPAROSCOPIC CHOLECYSTECTOMY;  Surgeon: Rubin Calamity, MD;  Location: [facility] OR;  Service: General;  Laterality: N/A;   LAPAROSCOPIC OOPHERECTOMY     TONSILLECTOMY  1969    Current Outpatient Medications  Medication Sig  Dispense Refill   aspirin EC 81 MG tablet Take 81 mg by mouth at bedtime.     Calcium-Vitamin D -Vitamin K (VIACTIV CALCIUM PLUS D) 650-12.5-40 MG-MCG-MCG CHEW Chew 1  tablet by mouth in the morning and at bedtime.     Cholecalciferol (VITAMIN D3 GUMMIES PO) Take 2 tablets by mouth in the morning.     Cyanocobalamin  (VITAMIN B-12 PO) Take 1,250 mcg by mouth in the morning.     esomeprazole  (NEXIUM ) 40 MG capsule Take 1 capsule (40 mg total) by mouth 2 (two) times daily before a meal. (Patient taking differently: Take 40 mg by mouth every evening.) 60 capsule 2   ezetimibe  (ZETIA ) 10 MG tablet Take 1 tablet (10 mg total) by mouth daily. 90 tablet 3   flecainide  (TAMBOCOR ) 100 MG tablet Take 1 tablet (100 mg total) by mouth 2 (two) times daily. 180 tablet 3   fluticasone (FLONASE) 50 MCG/ACT nasal spray Place 1 spray into both nostrils in the morning.     Glucosamine HCl (GLUCOSAMINE PO) Take 2,000 mg by mouth in the morning.     hydrochlorothiazide  (HYDRODIURIL ) 25 MG tablet TAKE ONE TABLET BY MOUTH DAILY 90 tablet 0   metFORMIN  (GLUCOPHAGE ) 500 MG tablet Take 1 tablet (500 mg total) by mouth daily. 90 tablet 1   Multiple Vitamins-Minerals (ADULT ONE DAILY GUMMIES PO) Take 2 tablets by mouth in the morning.     nitrofurantoin, macrocrystal-monohydrate, (MACROBID) 100 MG capsule Take 1 capsule by mouth as needed (bladder infection).     nitroGLYCERIN  (NITROSTAT ) 0.4 MG SL tablet Place 1 tablet (0.4 mg total) under the tongue every 5 minutes as needed for chest pain. Max 3 doses 25 tablet 2   ondansetron  (ZOFRAN -ODT) 4 MG disintegrating tablet DISSOLVE ONE TABLET BY MOUTH EVERY 4 HOURS AS NEEDED FOR NAUSEA AND VOMITING 30 tablet 2   simvastatin  (ZOCOR ) 20 MG tablet TAKE ONE TABLET BY MOUTH AT 6PM 90 tablet 3   VASCEPA  1 g capsule Take 2 capsules (2 g total) by mouth 2 (two) times daily. 360 capsule 3   gabapentin (NEURONTIN) 300 MG capsule Take 300 mg by mouth daily as needed.     No current  facility-administered medications for this visit.    Allergies as of 02/14/2024 - Review Complete 02/14/2024  Allergen Reaction Noted   Cocoa Other (See Comments) 03/12/2013   Chocolate Other (See Comments) 03/12/2013   Crestor [rosuvastatin]  03/19/2013   Erythromycin Nausea And Vomiting    Morphine  and codeine  Nausea And Vomiting 09/01/2018   Amoxicillin Rash 10/25/2007   Other Rash 11/30/2018   Sulfamethoxazole-trimethoprim Nausea Only 08/13/2019    Vitals: BP (!) 166/92   Pulse 68   Ht 5' 1.5 (1.562 m)   Wt 148 lb (67.1 kg)   BMI 27.51 kg/m   High BP here.     Physical exam:  General: The patient is awake, alert and appears not in acute distress.  The patient is well groomed. Head: Normocephalic, atraumatic.  Neck is supple.  Neck circumference:15 , Cardiovascular:  Regular rate and palpable peripheral pulse:  Respiratory: clear to auscultation.  Mallampati  2-3  Skin:  Without evidence of edema, or rash Trunk:  truncal obesity, pyknic    Neurologic exam : The patient is awake and alert, oriented to place and time.   Memory subjective  described as intact.  Sometimes looking for names .  There is a normal attention span & concentration ability.  Speech is fluent without  dysarthria, dysphonia or aphasia.  Mood and affect are appropriate.  Cranial nerves: Pupils are equal and briskly reactive to light.  Status post cataract surgery.  Able to tightly close both eyes.  Funduscopic  exam deferred;  Extraocular movements  in vertical and horizontal planes intact and without nystagmus.  Visual fields by finger perimetry are intact. Hearing to finger rub intact.    Facial sensation impaired around the right upper face is different, pressure and congestion and some numbness.  She reports some around the orbit all the time , but right forehead or right face below the cheek bone was sometimes affected.  Facial motor strength is symmetric and tongue and uvula move  midline.  Motor exam:   Normal tone and normal muscle bulk and symmetric normal strength in all extremities. Grip Strength equal.   Proximal strength of shoulder muscles and hip flexors was intact .  Sensory:  Fine touch and vibration were tested .  Proprioception was tested in the upper extremities only and  normal.  Coordination: Rapid alternating movements in the fingers/hands were normal.  Finger-to-nose maneuver was tested and showed no evidence of ataxia, dysmetria or tremor.  Gait and station: Patient walked with/ without assistive device .  Core Strength within normal limits.  Stance is stable and of wide/ normal. Base.  Tandem gait is deferred - , turns with 3 Steps are unfragmented.  Romberg testing negative .   Deep tendon reflexes: in the  upper and lower extremities are symmetric and  brisk without Clonus. TKR on the left side.  Babinski maneuver response is downgoing.   Assessment: Total time for face to face interview and examination, for review of  images and laboratory testing, neurophysiology testing and pre-existing records, including out-of -network , was 40 minutes.  Assessment is as follows here:  atypical facial pain, dysesthesia pressure and numbness.   1)   Normal MRI brain and MRA reviewed with patient, normal orbits, neither optic nerve is enlarged or pale, no cerebral  white matter disease. No metastasis or bleed.  No sinus active disease.  No sign of pressure behind the eye, [facility] Ophthalmology Dr Patrcia gave clean bill of retinal health.     the differential includes:  1) Trigeminal nerve abnormality,  2) complicated migraine - recently had a migraine for several days.  This can be residua after a status migrainosus.  This can also be a cuase of vestibular symptoms,   the rocking boat sensation , BPV / vestibular migraine  3) Myasthenia gravis. 4)  diabetic cranial nerve disease is a possibility.  Hb A1c was 6.8, next draw in the b next 14  days.  5) on flecainide  - could this be a side effect? SVT . No atrial fib.   6) Had hyponatremia and hypokalemia  in the last 6 months on HCTZ. And leg cramps are likely related. CKD stage 3 , mild    Plan:  Treatment plan and additional workup planned after today includes:   Myasthenia panel.  SPEP.   Trial of gabapentin 100 mg at night po. She has some at home already.   Migraine would be too infrequent to start preventive measures.    If labs have shown any abnormality, will direct accordingly,  RV prn or in 6 months    The patient's condition requires frequent monitoring and adjustments in the treatment plan, reflecting the ongoing complexity of care.  This provider is the continuing focal point for all needed services for this condition.   Dedra Gores, MD  [facility] Board certified by The [employer] of Sleep Medicine and Diplomate of the [employer] of Sleep Medicine. Board certified In Neurology through the  ABPN, Fellow of the [employer] of Neu[employer].

## 2024-02-14 NOTE — Patient Instructions (Addendum)
 Atypical facial pain.   Assessment: Total time for face to face interview and examination, for review of  images and laboratory testing, neurophysiology testing and pre-existing records, including out-of -network , was 40 minutes.   Assessment is as follows here:  atypical facial pain, dysesthesia pressure and numbness.    1)   Normal MRI brain and MRA reviewed with patient, normal orbits, neither optic nerve is enlarged or pale, no cerebral  white matter disease. No metastasis or bleed.  No sinus active disease.  No sign of pressure behind the eye, [facility] Ophthalmology Dr Patrcia gave clean bill of retinal health.      the differential includes:   1) Trigeminal nerve abnormality,  2) complicated migraine - recently had a migraine for several days.  This can be residua after a status migrainosus.  This can also be a cuase of vestibular symptoms,   the rocking boat sensation , BPV / vestibular migraine  3) Myasthenia gravis. 4)  diabetic cranial nerve disease is a possibility.  Hb A1c was 6.8, next draw in the b next 14 days.  5) on flecainide  - could this be a side effect? SVT . No atrial fib.    6) Had hyponatremia and hypokalemia  in the last 6 months on HCTZ. And leg cramps are likely related. CKD stage 3 , mild      Plan:  Treatment plan and additional workup planned after today includes:    Myasthenia panel.  SPEP.    Trial of gabapentin 100 mg at night po. She has some at home already.    Migraine would be too infrequent to start preventive measures.      If labs have shown any abnormality, will direct accordingly,  RV prn or in 6 months

## 2024-02-15 ENCOUNTER — Ambulatory Visit: Payer: Self-pay | Admitting: Neurology

## 2024-02-20 DIAGNOSIS — R002 Palpitations: Secondary | ICD-10-CM | POA: Diagnosis not present

## 2024-02-20 DIAGNOSIS — Z0000 Encounter for general adult medical examination without abnormal findings: Secondary | ICD-10-CM | POA: Diagnosis not present

## 2024-02-20 DIAGNOSIS — K2100 Gastro-esophageal reflux disease with esophagitis, without bleeding: Secondary | ICD-10-CM | POA: Diagnosis not present

## 2024-02-20 DIAGNOSIS — E663 Overweight: Secondary | ICD-10-CM | POA: Diagnosis not present

## 2024-02-20 DIAGNOSIS — Z1331 Encounter for screening for depression: Secondary | ICD-10-CM | POA: Diagnosis not present

## 2024-02-20 DIAGNOSIS — E1159 Type 2 diabetes mellitus with other circulatory complications: Secondary | ICD-10-CM | POA: Diagnosis not present

## 2024-02-20 DIAGNOSIS — R42 Dizziness and giddiness: Secondary | ICD-10-CM | POA: Diagnosis not present

## 2024-02-20 LAB — COMPREHENSIVE METABOLIC PANEL WITH GFR
ALT: 18 IU/L (ref 0–32)
AST: 19 IU/L (ref 0–40)
Albumin: 4.9 g/dL (ref 3.9–4.9)
Alkaline Phosphatase: 77 IU/L (ref 44–121)
BUN/Creatinine Ratio: 17 (ref 12–28)
BUN: 12 mg/dL (ref 8–27)
Bilirubin Total: 0.5 mg/dL (ref 0.0–1.2)
CO2: 19 mmol/L — ABNORMAL LOW (ref 20–29)
Calcium: 10.3 mg/dL (ref 8.7–10.3)
Chloride: 95 mmol/L — ABNORMAL LOW (ref 96–106)
Creatinine, Ser: 0.7 mg/dL (ref 0.57–1.00)
Globulin, Total: 2 g/dL (ref 1.5–4.5)
Glucose: 94 mg/dL (ref 70–99)
Potassium: 4.1 mmol/L (ref 3.5–5.2)
Sodium: 135 mmol/L (ref 134–144)
Total Protein: 6.9 g/dL (ref 6.0–8.5)
eGFR: 93 mL/min/1.73 (ref >59)

## 2024-02-20 LAB — PROTEIN ELECTROPHORESIS, SERUM

## 2024-02-20 LAB — ENA+DNA/DS+ANTICH+CENTRO+FA...
Anti JO-1: <0.2 AI (ref 0.0–0.9)
Antiribosomal P Antibodies: <0.2 AI (ref 0.0–0.9)
Centromere Ab Screen: <0.2 AI (ref 0.0–0.9)
Chromatin Ab SerPl-aCnc: <0.2 AI (ref 0.0–0.9)
ENA RNP Ab: <0.2 AI (ref 0.0–0.9)
ENA SM Ab Ser-aCnc: <0.2 AI (ref 0.0–0.9)
Nucleolar Pattern: 1:640 {titer} — ABNORMAL HIGH
Scleroderma (Scl-70) (ENA) Antibody, IgG: <0.2 AI (ref 0.0–0.9)
Smith/RNP Antibodies: <0.2 AI (ref 0.0–0.9)
dsDNA Ab: 1 [IU]/mL (ref 0–9)

## 2024-02-20 LAB — MULTIPLE MYELOMA PANEL, SERUM
Albumin SerPl Elph-Mcnc: 4.2 g/dL (ref 2.9–4.4)
Albumin/Glob SerPl: 1.6 (ref 0.7–1.7)
Alpha 1: 0.1 g/dL (ref 0.0–0.4)
Alpha2 Glob SerPl Elph-Mcnc: 0.8 g/dL (ref 0.4–1.0)
B-Globulin SerPl Elph-Mcnc: 1.1 g/dL (ref 0.7–1.3)
Gamma Glob SerPl Elph-Mcnc: 0.7 g/dL (ref 0.4–1.8)
Globulin, Total: 2.7 g/dL (ref 2.2–3.9)
IgA/Immunoglobulin A, Serum: 117 mg/dL (ref 87–352)
IgG (Immunoglobin G), Serum: 711 mg/dL (ref 586–1602)
IgM (Immunoglobulin M), Srm: 43 mg/dL (ref 26–217)

## 2024-02-20 LAB — VITAMIN B1: Thiamine: 118.9 nmol/L (ref 66.5–200.0)

## 2024-02-20 LAB — HEMOGLOBIN A1C
Est. average glucose Bld gHb Est-mCnc: 151 mg/dL
Hgb A1c MFr Bld: 6.9 % — ABNORMAL HIGH (ref 4.8–5.6)

## 2024-02-20 LAB — CBC WITH DIFFERENTIAL/PLATELET
Basophils Absolute: 0.1 x10E3/uL (ref 0.0–0.2)
Basos: 1 %
EOS (ABSOLUTE): 0.3 x10E3/uL (ref 0.0–0.4)
Eos: 4 %
Hematocrit: 43.7 % (ref 34.0–46.6)
Hemoglobin: 14.6 g/dL (ref 11.1–15.9)
Immature Grans (Abs): 0 x10E3/uL (ref 0.0–0.1)
Immature Granulocytes: 0 %
Lymphocytes Absolute: 2.6 x10E3/uL (ref 0.7–3.1)
Lymphs: 29 %
MCH: 32.6 pg (ref 26.6–33.0)
MCHC: 33.4 g/dL (ref 31.5–35.7)
MCV: 98 fL — ABNORMAL HIGH (ref 79–97)
Monocytes Absolute: 0.7 x10E3/uL (ref 0.1–0.9)
Monocytes: 8 %
Neutrophils Absolute: 5.2 x10E3/uL (ref 1.4–7.0)
Neutrophils: 58 %
Platelets: 385 x10E3/uL (ref 150–450)
RBC: 4.48 x10E6/uL (ref 3.77–5.28)
RDW: 12.7 % (ref 11.7–15.4)
WBC: 9 x10E3/uL (ref 3.4–10.8)

## 2024-02-20 LAB — VITAMIN B12: Vitamin B-12: >2000 pg/mL — ABNORMAL HIGH (ref 232–1245)

## 2024-02-20 LAB — SEDIMENTATION RATE: Sed Rate: 2 mm/h (ref 0–40)

## 2024-02-20 LAB — SJOGREN'S SYNDROME ANTIBODS(SSA + SSB)
ENA SSA (RO) Ab: <0.2 AI (ref 0.0–0.9)
ENA SSB (LA) Ab: <0.2 AI (ref 0.0–0.9)

## 2024-02-20 LAB — TSH: TSH: 0.852 u[IU]/mL (ref 0.450–4.500)

## 2024-02-20 LAB — C-REACTIVE PROTEIN: CRP: <1 mg/L (ref 0–10)

## 2024-02-20 LAB — ANA W/REFLEX: ANA Titer 1: POSITIVE — AB

## 2024-02-21 ENCOUNTER — Other Ambulatory Visit (HOSPITAL_BASED_OUTPATIENT_CLINIC_OR_DEPARTMENT_OTHER): Payer: Self-pay

## 2024-02-22 ENCOUNTER — Other Ambulatory Visit: Payer: Self-pay

## 2024-02-22 ENCOUNTER — Other Ambulatory Visit (HOSPITAL_BASED_OUTPATIENT_CLINIC_OR_DEPARTMENT_OTHER): Payer: Self-pay

## 2024-02-22 MED ORDER — METFORMIN HCL 500 MG PO TABS
500.0000 mg | ORAL_TABLET | Freq: Two times a day (BID) | ORAL | 0 refills | Status: DC
  Filled 2024-02-22: qty 180, 90d supply, fill #0

## 2024-02-23 ENCOUNTER — Other Ambulatory Visit (HOSPITAL_BASED_OUTPATIENT_CLINIC_OR_DEPARTMENT_OTHER): Payer: Self-pay

## 2024-02-29 ENCOUNTER — Encounter: Payer: Self-pay | Admitting: Internal Medicine

## 2024-03-01 DIAGNOSIS — Z23 Encounter for immunization: Secondary | ICD-10-CM | POA: Diagnosis not present

## 2024-03-02 ENCOUNTER — Encounter: Payer: Self-pay | Admitting: Internal Medicine

## 2024-03-02 NOTE — Telephone Encounter (Signed)
 Spoke to patient she stated she has been having chest pain off and on for over the past 2 weeks.Stated she had a episode a few days ago pain radiated up into neck.Stated she had a episode chest pain woke up up during night.No chest pain at present.She has not taken any NTG .Advised to try NTG SL next time she has chest pain.Appointment scheduled with Josefa Beauvais NP 9/23 at 2:45 pm.She will bring a list of B/P readings and all medications.Advised to go to ED over the weekend if chest pain not relieved by NTG.

## 2024-03-05 ENCOUNTER — Other Ambulatory Visit (HOSPITAL_BASED_OUTPATIENT_CLINIC_OR_DEPARTMENT_OTHER): Payer: Self-pay

## 2024-03-05 NOTE — Progress Notes (Unsigned)
 Cardiology Office Note    Patient Name: [name] Date of Encounter: 03/06/2024  Primary Care Provider:  Cleotilde Planas, MD Primary Cardiologist:  Vinie JAYSON Maxcy, MD Primary Electrophysiologist: None   Past Medical History    Past Medical History:  Diagnosis Date   ANXIETY 11/07/2009   Qualifier: Diagnosis of  By: Ever Riggers, Amy S - came after her diagnosis of breast cancer   Breast CA (HCC)    right - radiation & surgery   Diabetes (HCC)    Type II, diet control   DIVERTICULOSIS, COLON 06/26/2004   Qualifier: Diagnosis of  By: Myriam RN, Carissa     DM2 (diabetes mellitus, type 2) (HCC) 11/07/2009   Qualifier: Diagnosis of  By: Ever Riggers, Amy S    Family history of heart disease    Fatty liver    Gallstone    GASTRIC POLYP 03/30/2007   Qualifier: Diagnosis of  By: Myriam RN, Carissa     Gastroparesis    mild   GERD 11/07/2009   Qualifier: Diagnosis of  By: Ever Riggers, Amy S    History of nuclear stress test 12/2011   bruce protocol; no evidence of ischemia, EF 79%, normal pattern of perfusion   Hyperchloremia    Hyperlipidemia    HYPERTHYROIDISM 11/07/2009   Qualifier: Diagnosis of  By: Ever PA-c, Amy S    Irritable bowel syndrome 08/04/2007   Qualifier: Diagnosis of  By: Myriam RN, Carissa     MVP (mitral valve prolapse)    last echo 07/1997 - normal LV size/function, mild MVP, mild MR   Osteopenia    OVARIAN CYST 08/04/2007   Qualifier: Diagnosis of  By: Myriam RN, Maudie     Personal history of radiation therapy    PVC (premature ventricular contraction)    Vitamin D  deficiency     History of Present Illness  [name] is a 68 y.o. female with a PMH of CAD,PVCs, DM type II, HLD, breast cancer treated with chemotherapy, hypothyroidism, MVP who presents today for complaint of chest pain and lightheadedness.  [name] was last seen on 02/19/2023 for annual follow-up.  During her visit she reported doing well no new cardiac  complaints.  Her blood pressure was well-controlled and she had completed a total knee replacement and shoulder surgery with no complications.  She was continued on her current medications without any adjustments.  She was recently seen by [facility] neurology for complaint of eyelid weakness and ptosis with headaches.  She completed an MRI of the brain that was normal.  She contacted our office on 03/02/2024 with complaint of chest pain that was off and on with radiation to her neck.  She did not try nitroglycerin  and was advised to try with advisement to go to the ED pain persisted.  She also endorsed lightheadedness. She completed her last ischemic evaluation on 08/23/2019 by Lexiscan  Myoview .  She had an echo completed on 05/15/2023 with normal EF of 55-60% with grade 1 DD and RWMA.  [name] presents today with her husband for follow-up of chest pain and dizziness. She has been experiencing intermittent chest pain since September 15th, radiating to her neck. The pain is severe at times, with the worst episode occurring on the 15th while resting after household activities. The pain lasts several hours and she has used nitroglycerin , which has sometimes eased her symptoms. She has been monitoring her blood pressure at home, noting elevated readings, with the highest recorded at 165/91. She experiences  lightheadedness, particularly when standing still for extended periods, leading to near-syncope episodes. She needs to sit down or move around to alleviate the lightheadedness. No dizziness when transitioning from sitting to standing, but it occurs after being active or standing still. She has a history of premature ventricular contractions (PVCs), which she can feel occasionally. These are not constant but have been more noticeable recently, including before leaving her house for the appointment. She has been on hydrochlorothiazide  and has noticed elevated blood pressure readings at home. She is currently taking  flecainide  twice daily, in the morning and at night, and uses nitroglycerin  for chest pain management. She has been using a Zeo monitor for two weeks to track her heart activity, which she mailed back recently. She has a history of a stress test in 2021 and an echocardiogram in December of last year, which showed no significant abnormalities. Patient denies chest pain, palpitations, dyspnea, PND, orthopnea, nausea, vomiting, dizziness, syncope, edema, weight gain, or early satiety.  Discussed the use of AI scribe software for clinical note transcription with the patient, who gave verbal consent to proceed.  History of Present Illness   Review of Systems  Please see the history of present illness.    All other systems reviewed and are otherwise negative except as noted above.  Physical Exam    Wt Readings from Last 3 Encounters:  03/06/24 146 lb 12.8 oz (66.6 kg)  02/14/24 148 lb (67.1 kg)  03/11/23 142 lb (64.4 kg)   VS: Vitals:   03/06/24 1434 03/06/24 1533  BP: (!) 150/88 (!) 158/82  Pulse: 67   SpO2: 97%   ,Body mass index is 27.29 kg/m. GEN: Well nourished, well developed in no acute distress Neck: No JVD; No carotid bruits Pulmonary: Clear to auscultation without rales, wheezing or rhonchi  Cardiovascular: Normal rate. Regular rhythm. Normal S1. Normal S2.   Murmurs: 2/6 systolic murmur ABDOMEN: Soft, non-tender, non-distended EXTREMITIES:  No edema; No deformity   EKG/LABS/ Recent Cardiac Studies   ECG personally reviewed by me today -sinus rhythm with rate of 67 bpm and no acute changes consistent with previous EKG.  Risk Assessment/Calculations:          Lab Results  Component Value Date   WBC 9.0 02/14/2024   HGB 14.6 02/14/2024   HCT 43.7 02/14/2024   MCV 98 (H) 02/14/2024   PLT 385 02/14/2024   Lab Results  Component Value Date   CREATININE 0.70 02/14/2024   BUN 12 02/14/2024   NA 135 02/14/2024   K 4.1 02/14/2024   CL 95 (L) 02/14/2024   CO2 19 (L)  02/14/2024   Lab Results  Component Value Date   CHOL 120 03/04/2023   HDL 34 (L) 03/04/2023   LDLCALC 62 03/04/2023   TRIG 137 03/04/2023   CHOLHDL 3.5 03/04/2023    Lab Results  Component Value Date   HGBA1C 6.9 (H) 02/14/2024   Assessment & Plan    Assessment & Plan  1.  Chest pain: - Intermittent chest pain radiating to the neck, occurring with exertion and at rest. Previous stress test in 2021 showed no ischemia. Current EKG shows no acute changes. - Order Lexiscan  stress test to assess for ischemia and evaluate heart function.  2. Coronary artery disease: -Coronary CTA 12/2017 with mild to moderate calcification and ostial/proximal LAD most recent ischemic evaluation completed 2021 by Lexiscan  Myoview  that was low risk -Today patient reports ongoing intermittent chest pain. - ED precautions were discussed and patient encouraged  to use as needed nitroglycerin  if chest pains persist. - Patient will complete Lexiscan  Myoview  as noted above.  3.  PVCs: -Occasional PVCs with no associated dizziness or syncope.  -Previous echocardiogram showed no significant abnormalities. - Review Zio monitor results from PCP to assess PVC burden and correlate with symptoms. - Discussed that if stress test shows heart disease, flecainide  may need to be discontinued due to contraindications.  4.  HLD: - Patient's last LDL cholesterol was 62 - Continue Zetia  10 mg, Zocor  20 mg  5. Presyncope:  Reports episodes of lightheadedness and presyncope, particularly when standing still. Differential includes dehydration, arrhythmia, or blood pressure fluctuations. - Encourage adequate hydration. - Review Zio monitor results. - Evaluate with stress test. - Discussed potential need for further evaluation if symptoms persist.  6.  Essential hypertension: -HYPERTENSION CONTROL Vitals:   03/06/24 1434 03/06/24 1533  BP: (!) 150/88 (!) 158/82    The patient's blood pressure is elevated above target  today.  In order to address the patient's elevated BP: Blood pressure will be monitored at home to determine if medication changes need to be made.; A current anti-hypertensive medication was adjusted today.; Follow up with general cardiology has been recommended.     Recent increase in blood pressure readings at home, with highest recorded at 165/91 mmHg. Current medication is hydrochlorothiazide . - Discontinue hydrochlorothiazide . - Start Hyzaar  50/12.5 mg flecainide  100 mg twice daily - Patient advised to continue to monitor BP - BMET in 2 weeks  Disposition: Follow-up with Vinie JAYSON Maxcy, MD or APP in 1 months Informed Consent   Shared Decision Making/Informed Consent The risks [chest pain, shortness of breath, cardiac arrhythmias, dizziness, blood pressure fluctuations, myocardial infarction, stroke/transient ischemic attack, nausea, vomiting, allergic reaction, radiation exposure, metallic taste sensation and life-threatening complications (estimated to be 1 in 10,000)], benefits (risk stratification, diagnosing coronary artery disease, treatment guidance) and alternatives of a nuclear stress test were discussed in detail with [name] and she agrees to proceed.      Signed, Wyn Raddle, Jackee Shove, NP 03/06/2024, 3:34 PM [facility]

## 2024-03-06 ENCOUNTER — Encounter: Payer: Self-pay | Admitting: Nurse Practitioner

## 2024-03-06 ENCOUNTER — Encounter (HOSPITAL_COMMUNITY): Payer: Self-pay

## 2024-03-06 ENCOUNTER — Other Ambulatory Visit: Payer: Self-pay

## 2024-03-06 ENCOUNTER — Ambulatory Visit: Attending: Nurse Practitioner | Admitting: Nurse Practitioner

## 2024-03-06 ENCOUNTER — Other Ambulatory Visit (HOSPITAL_BASED_OUTPATIENT_CLINIC_OR_DEPARTMENT_OTHER): Payer: Self-pay

## 2024-03-06 VITALS — BP 158/82 | HR 67 | Ht 61.5 in | Wt 146.8 lb

## 2024-03-06 DIAGNOSIS — R079 Chest pain, unspecified: Secondary | ICD-10-CM

## 2024-03-06 DIAGNOSIS — I10 Essential (primary) hypertension: Secondary | ICD-10-CM

## 2024-03-06 DIAGNOSIS — R42 Dizziness and giddiness: Secondary | ICD-10-CM | POA: Diagnosis not present

## 2024-03-06 DIAGNOSIS — E785 Hyperlipidemia, unspecified: Secondary | ICD-10-CM | POA: Diagnosis not present

## 2024-03-06 DIAGNOSIS — I493 Ventricular premature depolarization: Secondary | ICD-10-CM | POA: Diagnosis not present

## 2024-03-06 DIAGNOSIS — I25119 Atherosclerotic heart disease of native coronary artery with unspecified angina pectoris: Secondary | ICD-10-CM

## 2024-03-06 DIAGNOSIS — R55 Syncope and collapse: Secondary | ICD-10-CM

## 2024-03-06 MED ORDER — SIMVASTATIN 20 MG PO TABS
20.0000 mg | ORAL_TABLET | Freq: Every day | ORAL | 3 refills | Status: AC
  Filled 2024-03-06: qty 90, 90d supply, fill #0
  Filled 2024-06-17: qty 90, 90d supply, fill #1

## 2024-03-06 MED ORDER — LOSARTAN POTASSIUM-HCTZ 50-12.5 MG PO TABS
1.0000 | ORAL_TABLET | Freq: Every day | ORAL | 1 refills | Status: AC
  Filled 2024-03-06: qty 90, 90d supply, fill #0
  Filled 2024-05-28: qty 90, 90d supply, fill #1

## 2024-03-06 MED ORDER — EZETIMIBE 10 MG PO TABS
10.0000 mg | ORAL_TABLET | Freq: Every day | ORAL | 3 refills | Status: AC
  Filled 2024-03-06 – 2024-04-05 (×2): qty 90, 90d supply, fill #0
  Filled 2024-06-17: qty 90, 90d supply, fill #1

## 2024-03-06 NOTE — Patient Instructions (Signed)
 Medication Instructions:  STOP Hydrochlorothiazide  START Hyzaar  (losartan  Hydrochlorothiazide ) 50/12.5mg  Take 1 tablet once a day   *If you need a refill on your cardiac medications before your next appointment, please call your pharmacy*  Lab Work: BMET IN 2 WEEKS If you have labs (blood work) drawn today and your tests are completely normal, you will receive your results only by: MyChart Message (if you have MyChart) OR A paper copy in the mail If you have any lab test that is abnormal or we need to change your treatment, we will call you to review the results.  Testing/Procedures: Your physician has requested that you have a lexiscan  myoview . For further information please visit [email or web address] Please follow instruction sheet, as given.   Follow-Up: At [facility], you and your health needs are our priority.  As part of our continuing mission to provide you with exceptional heart care, our providers are all part of one team.  This team includes your primary Cardiologist (physician) and Advanced Practice Providers or APPs (Physician Assistants and Nurse Practitioners) who all work together to provide you with the care you need, when you need it.  Your next appointment:   1 month(s)  Provider:   Vinie JAYSON Maxcy, MD    We recommend signing up for the patient portal called MyChart.  Sign up information is provided on this After Visit Summary.  MyChart is used to connect with patients for Virtual Visits (Telemedicine).  Patients are able to view lab/test results, encounter notes, upcoming appointments, etc.  Non-urgent messages can be sent to your provider as well.   To learn more about what you can do with MyChart, go to [email or web address].   Other Instructions Check your blood pressure daily for 2 weeks, then contact the office with your readings.  Contact the office either by phone or MyChart with your readings.  Make sure to check your blood pressure 2 hours  after taking your medications.   AVOID these things for 30 minutes before checking your blood pressure: No Drinking caffeine. No Drinking alcohol. No Eating. No Smoking. No Exercising.  Five minutes before checking your blood pressure: Pee. Sit in a dining chair. Avoid sitting in a soft couch or armchair. Be quiet. Do not talk.

## 2024-03-07 ENCOUNTER — Other Ambulatory Visit: Payer: Self-pay | Admitting: Nurse Practitioner

## 2024-03-07 DIAGNOSIS — I493 Ventricular premature depolarization: Secondary | ICD-10-CM

## 2024-03-07 DIAGNOSIS — I25119 Atherosclerotic heart disease of native coronary artery with unspecified angina pectoris: Secondary | ICD-10-CM

## 2024-03-07 DIAGNOSIS — R42 Dizziness and giddiness: Secondary | ICD-10-CM

## 2024-03-07 DIAGNOSIS — R002 Palpitations: Secondary | ICD-10-CM | POA: Diagnosis not present

## 2024-03-07 DIAGNOSIS — R079 Chest pain, unspecified: Secondary | ICD-10-CM

## 2024-03-07 DIAGNOSIS — I10 Essential (primary) hypertension: Secondary | ICD-10-CM

## 2024-03-07 DIAGNOSIS — E785 Hyperlipidemia, unspecified: Secondary | ICD-10-CM

## 2024-03-13 ENCOUNTER — Ambulatory Visit: Payer: Self-pay | Admitting: Nurse Practitioner

## 2024-03-13 ENCOUNTER — Ambulatory Visit (HOSPITAL_COMMUNITY)
Admission: RE | Admit: 2024-03-13 | Discharge: 2024-03-13 | Disposition: A | Discharge status: Home / Self Care | Source: Ambulatory Visit | Attending: Nurse Practitioner | Admitting: Nurse Practitioner

## 2024-03-13 DIAGNOSIS — R42 Dizziness and giddiness: Secondary | ICD-10-CM | POA: Insufficient documentation

## 2024-03-13 DIAGNOSIS — I2510 Atherosclerotic heart disease of native coronary artery without angina pectoris: Secondary | ICD-10-CM | POA: Diagnosis not present

## 2024-03-13 DIAGNOSIS — I493 Ventricular premature depolarization: Secondary | ICD-10-CM | POA: Insufficient documentation

## 2024-03-13 DIAGNOSIS — R079 Chest pain, unspecified: Secondary | ICD-10-CM | POA: Diagnosis not present

## 2024-03-13 DIAGNOSIS — I10 Essential (primary) hypertension: Secondary | ICD-10-CM | POA: Insufficient documentation

## 2024-03-13 DIAGNOSIS — E785 Hyperlipidemia, unspecified: Secondary | ICD-10-CM | POA: Insufficient documentation

## 2024-03-13 DIAGNOSIS — I25119 Atherosclerotic heart disease of native coronary artery with unspecified angina pectoris: Secondary | ICD-10-CM | POA: Insufficient documentation

## 2024-03-13 DIAGNOSIS — R55 Syncope and collapse: Secondary | ICD-10-CM | POA: Diagnosis not present

## 2024-03-13 DIAGNOSIS — I700 Atherosclerosis of aorta: Secondary | ICD-10-CM | POA: Diagnosis not present

## 2024-03-13 LAB — MYOCARDIAL PERFUSION IMAGING
LV dias vol: 72 mL (ref 46–106)
LV sys vol: 14 mL
Nuc Stress EF: 81 %
Peak HR: 94 {beats}/min
Rest HR: 61 {beats}/min
Rest Nuclear Isotope Dose: 10.6 mCi
SDS: 0
SRS: 3
SSS: 0
ST Depression (mm): 0 mm
Stress Nuclear Isotope Dose: 32.5 mCi
TID: 0.85

## 2024-03-13 MED ORDER — REGADENOSON 0.4 MG/5ML IV SOLN
0.4000 mg | Freq: Once | INTRAVENOUS | Status: AC
  Administered 2024-03-13: 0.4 mg via INTRAVENOUS

## 2024-03-13 MED ORDER — REGADENOSON 0.4 MG/5ML IV SOLN
INTRAVENOUS | Status: AC
  Filled 2024-03-13: qty 5

## 2024-03-13 MED ORDER — TECHNETIUM TC 99M TETROFOSMIN IV KIT
10.6000 | PACK | Freq: Once | INTRAVENOUS | Status: AC | PRN
  Administered 2024-03-13: 10.6 via INTRAVENOUS

## 2024-03-13 MED ORDER — TECHNETIUM TC 99M TETROFOSMIN IV KIT
32.5000 | PACK | Freq: Once | INTRAVENOUS | Status: AC | PRN
Start: 2024-03-13 — End: 2024-03-13
  Administered 2024-03-13: 32.5 via INTRAVENOUS

## 2024-03-13 NOTE — Telephone Encounter (Signed)
 Routing message to Jackee to review.

## 2024-03-19 ENCOUNTER — Other Ambulatory Visit (HOSPITAL_BASED_OUTPATIENT_CLINIC_OR_DEPARTMENT_OTHER): Payer: Self-pay

## 2024-03-19 MED ORDER — ACCU-CHEK GUIDE TEST VI STRP
ORAL_STRIP | 5 refills | Status: AC
  Filled 2024-03-19: qty 50, 50d supply, fill #0
  Filled 2024-05-02: qty 50, 50d supply, fill #1
  Filled 2024-06-21 – 2024-06-28 (×2): qty 50, 50d supply, fill #2

## 2024-03-19 MED ORDER — ACCU-CHEK SOFTCLIX LANCETS MISC
5 refills | Status: AC
  Filled 2024-03-19: qty 100, 100d supply, fill #0
  Filled 2024-06-21 – 2024-06-29 (×2): qty 100, 100d supply, fill #1

## 2024-03-20 DIAGNOSIS — E785 Hyperlipidemia, unspecified: Secondary | ICD-10-CM | POA: Diagnosis not present

## 2024-03-20 DIAGNOSIS — R079 Chest pain, unspecified: Secondary | ICD-10-CM | POA: Diagnosis not present

## 2024-03-20 DIAGNOSIS — I25119 Atherosclerotic heart disease of native coronary artery with unspecified angina pectoris: Secondary | ICD-10-CM | POA: Diagnosis not present

## 2024-03-20 DIAGNOSIS — I493 Ventricular premature depolarization: Secondary | ICD-10-CM | POA: Diagnosis not present

## 2024-03-21 ENCOUNTER — Other Ambulatory Visit (HOSPITAL_BASED_OUTPATIENT_CLINIC_OR_DEPARTMENT_OTHER): Payer: Self-pay

## 2024-03-21 LAB — BASIC METABOLIC PANEL WITH GFR
BUN/Creatinine Ratio: 13 (ref 12–28)
BUN: 10 mg/dL (ref 8–27)
CO2: 24 mmol/L (ref 20–29)
Calcium: 10 mg/dL (ref 8.7–10.3)
Chloride: 93 mmol/L — ABNORMAL LOW (ref 96–106)
Creatinine, Ser: 0.75 mg/dL (ref 0.57–1.00)
Glucose: 132 mg/dL — ABNORMAL HIGH (ref 70–99)
Potassium: 4.4 mmol/L (ref 3.5–5.2)
Sodium: 132 mmol/L — ABNORMAL LOW (ref 134–144)
eGFR: 86 mL/min/1.73 (ref >59)

## 2024-03-21 MED ORDER — FUROSEMIDE 20 MG PO TABS
20.0000 mg | ORAL_TABLET | Freq: Every day | ORAL | 3 refills | Status: DC | PRN
  Filled 2024-03-21: qty 30, 30d supply, fill #0

## 2024-03-21 NOTE — Telephone Encounter (Signed)
 Wyn Jackee VEAR Mickey., NP to Sebastian Janese GRADE, CMA     03/21/24  8:39 AM Result Note Please let patient know that her electrolytes and renal function are stable with initiation of new BP medication.  Your sodium and chloride levels are slightly abnormal and will normalize once your body gets used to the new medication.  Please encourage patient to increase fluid intake to at least 64 ounces of water daily.  Please let us  know if you have any further questions and continue current medications as prescribed.     Jackee Wyn, NP Basic Metabolic Panel [facility]  Called patient about message. Patient verbalized understanding.

## 2024-03-26 ENCOUNTER — Encounter: Payer: Self-pay | Admitting: Internal Medicine

## 2024-03-26 DIAGNOSIS — R002 Palpitations: Secondary | ICD-10-CM | POA: Diagnosis not present

## 2024-03-26 DIAGNOSIS — E785 Hyperlipidemia, unspecified: Secondary | ICD-10-CM

## 2024-03-27 NOTE — Telephone Encounter (Signed)
 Yes, please order an updated NMR lipoprofile and LP(a) to get prior to the appt.   Dr. Mona

## 2024-03-29 ENCOUNTER — Other Ambulatory Visit (HOSPITAL_BASED_OUTPATIENT_CLINIC_OR_DEPARTMENT_OTHER): Payer: Self-pay

## 2024-03-29 MED ORDER — GABAPENTIN 300 MG PO CAPS
300.0000 mg | ORAL_CAPSULE | Freq: Every day | ORAL | 0 refills | Status: DC
  Filled 2024-03-29: qty 90, 90d supply, fill #0

## 2024-04-02 ENCOUNTER — Other Ambulatory Visit: Payer: Self-pay | Admitting: Internal Medicine

## 2024-04-02 LAB — NMR, LIPOPROFILE
Cholesterol, Total: 112 mg/dL (ref 100–199)
HDL Particle Number: 29.6 umol/L — ABNORMAL LOW (ref >=30.5)
HDL-C: 35 mg/dL — ABNORMAL LOW (ref >39)
LDL Particle Number: 640 nmol/L (ref <1000)
LDL Size: 19.9 nm — ABNORMAL LOW (ref >20.5)
LDL-C (NIH Calc): 52 mg/dL (ref 0–99)
LP-IR Score: 59 — ABNORMAL HIGH (ref <=45)
Small LDL Particle Number: 493 nmol/L (ref <=527)
Triglycerides: 143 mg/dL (ref 0–149)

## 2024-04-02 LAB — LIPOPROTEIN A (LPA): Lipoprotein (a): 48.9 nmol/L (ref <75.0)

## 2024-04-05 ENCOUNTER — Other Ambulatory Visit (HOSPITAL_BASED_OUTPATIENT_CLINIC_OR_DEPARTMENT_OTHER): Payer: Self-pay

## 2024-04-06 ENCOUNTER — Other Ambulatory Visit (HOSPITAL_BASED_OUTPATIENT_CLINIC_OR_DEPARTMENT_OTHER): Payer: Self-pay

## 2024-04-06 ENCOUNTER — Encounter: Payer: Self-pay | Admitting: Internal Medicine

## 2024-04-06 ENCOUNTER — Ambulatory Visit: Attending: Internal Medicine | Admitting: Internal Medicine

## 2024-04-06 VITALS — BP 108/74 | HR 66 | Ht 61.5 in | Wt 146.4 lb

## 2024-04-06 DIAGNOSIS — Z79899 Other long term (current) drug therapy: Secondary | ICD-10-CM

## 2024-04-06 DIAGNOSIS — R079 Chest pain, unspecified: Secondary | ICD-10-CM

## 2024-04-06 DIAGNOSIS — I10 Essential (primary) hypertension: Secondary | ICD-10-CM | POA: Diagnosis not present

## 2024-04-06 DIAGNOSIS — I493 Ventricular premature depolarization: Secondary | ICD-10-CM | POA: Diagnosis not present

## 2024-04-06 DIAGNOSIS — I25119 Atherosclerotic heart disease of native coronary artery with unspecified angina pectoris: Secondary | ICD-10-CM | POA: Diagnosis not present

## 2024-04-06 DIAGNOSIS — Z5181 Encounter for therapeutic drug level monitoring: Secondary | ICD-10-CM

## 2024-04-06 MED ORDER — FUROSEMIDE 20 MG PO TABS
20.0000 mg | ORAL_TABLET | Freq: Every day | ORAL | 3 refills | Status: AC
  Filled 2024-04-06 – 2024-04-16 (×4): qty 90, 90d supply, fill #0
  Filled 2024-07-09: qty 90, 90d supply, fill #1
  Filled ????-??-??: fill #0

## 2024-04-06 NOTE — Patient Instructions (Signed)
 Medication Instructions:  NO CHANGES  *If you need a refill on your cardiac medications before your next appointment, please call your pharmacy*   Follow-Up: At [facility], you and your health needs are our priority.  As part of our continuing mission to provide you with exceptional heart care, our providers are all part of one team.  This team includes your primary Cardiologist (physician) and Advanced Practice Providers or APPs (Physician Assistants and Nurse Practitioners) who all work together to provide you with the care you need, when you need it.  Your next appointment:    12 months with Dr.Hilty  We recommend signing up for the patient portal called MyChart.  Sign up information is provided on this After Visit Summary.  MyChart is used to connect with patients for Virtual Visits (Telemedicine).  Patients are able to view lab/test results, encounter notes, upcoming appointments, etc.  Non-urgent messages can be sent to your provider as well.   To learn more about what you can do with MyChart, go to [email or web address].   Other Instructions

## 2024-04-06 NOTE — Progress Notes (Signed)
 OFFICE NOTE  Chief Complaint:  Routine follow-up  Primary Care Physician: Cleotilde Planas, MD  HPI:  RIYANSHI WAHAB is a 68 year old female previously followed by Dr. Morgan with a history of lower extremity edema recently and chest discomfort in the spring which she underwent a stress test for that was negative for ischemia with a preserved EF of 71%. She has had no further episodes of chest pain, and her edema has all but resolved, however, she stayed on the hydrochlorothiazide  25 mg daily. She also has hypertension which has been well controlled and a history of breast cancer but is now cancer free. Recently she had a fall and fractured her right humerus and has not been as active from that but has since started to get back to some more activity. 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 She recently had laboratory work which shows a well controlled lipid profile, total cholesterol 185, triglycerides 286, HDL 36 and LDL 92. This is on fenofibrate . She has had a history of elevated triglycerides in the past, but is nondiabetic.  She was doing very well when I saw her last week and recommended a dental holiday. She was not having any significant palpitations or PVCs that she mentioned. Less than a week after her office visit, she reported a day where she felt terrible all day. She reported having numerous PVCs throughout the day and she says that that completely zaps her energy. The next day she felt very fatigued and today, 2 days later she still feels quite weakened. Her EKG in the office today shows no evidence of PVCs.  This is apparently been an ongoing problem for years for her, was advanced coming paroxysmally and being associated with extreme fatigue.  I had referred her to see Dr. Danelle Birmingham with cardiac electrophysiology. He recommended starting her on low-dose flecainide . Since starting that she has reported feeling much better. She was  placed on a longer monitor which showed essentially sinus rhythm and 1 atrial couplet which she noted a skipped beat otherwise no paroxysmal atrial fibrillation, PVC's, nonsustained VT or other arrhythmias were noted. Dr. Birmingham recommended weaning off atenolol  and she is currently on flecainide  75 mg twice daily.  Ms. Bazin returns today for followup. She reports her palpitations are essentially gone and flecainide . She denies any chest pain. Unfortunately she's had recurrent sinusitis and is currently on doxycycline. She's had issues with her sinuses for a long time and now is also having problems with her years. I recommended she see an ear nose and throat doctor. She has had improvement in her lipid profile on current medications. Her LDL particle number is down to 1480, from 1824. Calculated LDL is down to 80 from 116 and triglycerides are mildly elevated still at 191.  Mrs. Ashraf returns today for follow-up of her palpitations. She reports a been generally well controlled on flecainide . She is currently on senna 5 mg twice daily. Her last stress test was in 2013. She denies any chest pain or worsening shortness of breath. On aspirin for anticoagulation, given her young age and infrequency of symptoms.b  I saw Mrs. Pals back in the office today for follow-up. Overall she reports very good control of her palpitations. She denies any chest pain or shortness of breath. She reports her reflux symptoms are well controlled. She recently had a cholesterol check however that result is still pending. Were hoping to see an improvement in cholesterol  with the addition of fenofibrate . She is retired from [employer] job and now works full-time in [employer].   10/13/2015  Mrs. Kastens returns today for follow-up. She is without any complaints. She is overall doing well and continues to be active. She denies any recurrent atrial fibrillation. She is maintaining sinus rhythm at 63 with a QTC of 431 ms.  She is aware to avoid certain medications with her flecainide  which is currently 75 mg twice a day. Cholesterol is been well controlled and is due for repeat check.  10/14/2016  Mrs. Fay returns today for follow-up. She recently broke the great toe on her right foot. She is currently in a walking boot. Hopefully that will come off next week. She denies any chest pain or worsening shortness of breath. She says her PVCs are well controlled on flecainide . We reviewed her most recent lipid profile which demonstrates total cholesterol of 144, HDL-C 30, triglycerides 188, LDL-C of 86. LDL-P of 1194, non-HDL-C of 114 and ApoB of 82.    10/26/2017  Mrs. Sanville returns today for follow-up.  She underwent repeat lipid particle testing which showed LDL P of 1151, which is stable compared to her values last year.  She reports recently an increase in some PVCs, particularly exertionally and some recent shortness of breath when she was moving chairs at a funeral.  Blood pressure is well controlled.  She underwent a routine treadmill stress test given her flecainide  use.  This did show some borderline abnormalities including 2 mm of J-point depression with upsloping depression at peak exercise and some mild horizontal ST segment depression less than 1 mm.  She was able to exercise for 9 minutes and 10 metabolic equivalents.  She did report shortness of breath and fatigue during the study.  These are new symptoms for her.  Is not clear whether this could be angina on the study is borderline abnormal.  01/12/2018  Mrs. Gebert returns for follow-up.  Recently she underwent exercise treadmill stress testing for flecainide  and was found to have some borderline abnormalities including some J-point depression and horizontal ST segment depression.  She did report shortness of breath and fatigue during the study.  Subsequently she has had 3 separate episodes of acute onset chest discomfort which lasted only for a few minutes  then followed by PVCs.  All of these were associated with minimal exertion, such as pushing her granddaughter around in the stroller.  She was sent for a coronary CT angiogram.  12/13/2017 and demonstrated mild nonobstructive predominantly calcified plaque in the distal left main and ostial/proximal LAD between 25 and 50%.  Aggressive risk factor modification was recommended.  There is no obstructive coronary disease.  Coronary calcium score was 161.  Continues to have concerns about chest pain, although I try to reassure based on these findings.  I suspect she may be having symptomatic PVCs.  This was well controlled previously on flecainide  however is more prevalent.  In addition today we discussed her lipid profile.  Her LDL-P remains elevated at 8848.  Her goal LDL-P is less than 700 and given her coronary artery disease, I think she is a good candidate to add PCSK9 inhibitor therapy.  Is currently on maximally tolerated therapy with simvastatin  20 mg, fenofibrate  145 and ezetimibe  10 mg daily.  02/13/2019  Ms. Kirchner is seen today in follow-up.  She seems to be doing pretty well.  This spring she had some kind of a viral illness with fever and diarrhea that  was determined to be a colitis.  She lost some weight with that and then ultimately became an overt diabetic.  She is now on metformin  and blood sugars are better controlled.  She is lost some weight again after gaining it back and is just barely in the overweight category.  Blood pressure looks good today.  In fact overall she looks fairly well.  She had repeat lipid testing which showed LDL-P of 1202, LDL-C 57, triglycerides 181 and a small LDL P of 904 which is elevated.  Based on these findings, she does have some discordance in her lipid profile suggesting she is at persistent risk.  This is important because she was found to have some mild to moderate proximal to ostial LAD disease on CT scan which was calcified that we are treating aggressively.  At  this point her triglycerides remain elevated and this may also be a target of treatment.  She is on flecainide  without recurrent PVCs.  We talked about the use of class Ic antiarrhythmics in patients with coronary disease.  I think were okay to continue it without active angina however I would reach out to Dr. Waddell to see if he is okay with continuing the flecainide .  08/13/2019  Ms. Pais is seen today in follow-up.  She recently had repeat lipid NMR which showed further improvement in her LDL-P and overall cholesterol profile.  Her hemoglobin A1c is down to 6.  Based on this it seems that she is improving with changes in her diet.  She reports less stress as she retired from [employer].  She is very concerned about her heart, however especially since she was found to have coronary disease by CT angiography.  She is remained on flecainide  and reports recently an increase in her PVCs.  She says with exercise or exertion she notes increase in heart rate and PVCs with heart rates up to the 150s.  She does have 1/5 generation apple watch and she has been monitoring this closely.  She reports chest pressure during these episodes but no real chest pain.  Her last exercise treadmill stress test was in 2019.  6/92021  Tanica is seen today in follow-up.  Overall she is doing well.  She had been seen by Dr. Waddell and wore monitor for possible increase in PVCs however during that period of monitoring there was no significant arrhythmia on the monitor was significantly normal.  She says she has been doing better since retiring from [employer].  She seems to be more relaxed.  Lipids were stable recently if not slightly improved.  Her blood pressure was minimally elevated today.  11/27/2020  Khristi returns today for follow-up.  Overall she says she is feeling well.  Recently however she had problems with her gallbladder.  She underwent cholecystectomy.  Since then she has been losing weight.  She is changed her  diet and cut out fats.  Weight is down to 139 from 151 pounds.  Her particle numbers have come down as well from 1093-9 56 with LDL 62.  Triglycerides are slightly higher and she noted that she did stop her Vascepa .  Small LDL P is lower at 700.  Blood pressure is well controlled and EKG shows a sinus rhythm.  04/06/2024  Koda is seen today in follow-up.  Overall she seems to be doing well.  She had a recent lipid profile which showed a low particle number of 640, LDL 52, HDL 35 and triglycerides 143.  She had  had some possible cardiac symptoms and underwent a Myoview  stress test in September 2025 which was negative for ischemia.  She also had an echo in October 2024 which showed a normal LVEF and no significant wall motion abnormalities.  She reports minimal breakthrough PVCs on flecainide .  I personally reviewed her EKG from September 2025 which shows normal axes and intervals associated with flecainide .  Blood pressure is well-controlled today.  PMHx:  Past Medical History:  Diagnosis Date   ANXIETY 11/07/2009   Qualifier: Diagnosis of  By: Ever Riggers, Amy S - came after her diagnosis of breast cancer   Breast CA (HCC)    right - radiation & surgery   Diabetes (HCC)    Type II, diet control   DIVERTICULOSIS, COLON 06/26/2004   Qualifier: Diagnosis of  By: Myriam RN, Carissa     DM2 (diabetes mellitus, type 2) (HCC) 11/07/2009   Qualifier: Diagnosis of  By: Ever Riggers, Amy S    Family history of heart disease    Fatty liver    Gallstone    GASTRIC POLYP 03/30/2007   Qualifier: Diagnosis of  By: Myriam RN, Carissa     Gastroparesis    mild   GERD 11/07/2009   Qualifier: Diagnosis of  By: Ever Riggers, Amy S    History of nuclear stress test 12/2011   bruce protocol; no evidence of ischemia, EF 79%, normal pattern of perfusion   Hyperchloremia    Hyperlipidemia    HYPERTHYROIDISM 11/07/2009   Qualifier: Diagnosis of  By: Ever PA-c, Amy S    Irritable bowel syndrome  08/04/2007   Qualifier: Diagnosis of  By: Myriam RN, Carissa     MVP (mitral valve prolapse)    last echo 07/1997 - normal LV size/function, mild MVP, mild MR   Osteopenia    OVARIAN CYST 08/04/2007   Qualifier: Diagnosis of  By: Myriam RN, Maudie     Personal history of radiation therapy    PVC (premature ventricular contraction)    Vitamin D  deficiency     Past Surgical History:  Procedure Laterality Date   ABDOMINAL HYSTERECTOMY  03/2003   APPENDECTOMY  01/2002   BREAST LUMPECTOMY Right 2006   x2   CARDIAC CATHETERIZATION  10/13/2009   no significant fixed obstructive CAD, normral ramus intermediate/circumflex/RCA; normal LV function (Dr. IVAR Sor)   CHOLECYSTECTOMY N/A 11/18/2020   Procedure: LAPAROSCOPIC CHOLECYSTECTOMY;  Surgeon: Rubin Calamity, MD;  Location: [facility] OR;  Service: General;  Laterality: N/A;   LAPAROSCOPIC OOPHERECTOMY     TONSILLECTOMY  1969    FAMHx:  Family History  Problem Relation Age of Onset   Diabetes Maternal Grandmother    Heart failure Maternal Grandmother    Emphysema Maternal Grandfather    Breast cancer Paternal Grandmother    Heart disease Paternal Grandfather    Alzheimer's disease Mother    Heart failure Father    Hypertension Father        also murmur, hyperlipidemia, MI, CABG age 107   Diabetes Brother    Heart disease Brother 64       MI at 43   Hypertension Brother    Hyperlipidemia Sister    Breast cancer Maternal Aunt        PVCs   Asthma Child        x2   Colon cancer Neg Hx    Colon polyps Neg Hx    Stomach cancer Neg Hx    Esophageal cancer Neg Hx  SOCHx:   reports that she has never smoked. She has never used smokeless tobacco. She reports current alcohol use. She reports that she does not use drugs.  ALLERGIES:  Allergies  Allergen Reactions   Cocoa Other (See Comments)    migraine   Chocolate Other (See Comments)    migraine   Crestor [Rosuvastatin]     myalgias   Erythromycin Nausea And Vomiting    Morphine  And Codeine  Nausea And Vomiting   Amoxicillin Rash   Other Rash    Tape, bandaids   Sulfamethoxazole-Trimethoprim Nausea Only    ROS: A comprehensive review of systems was negative.  HOME MEDS: Current Outpatient Medications  Medication Sig Dispense Refill   Accu-Chek Softclix Lancets lancets Use as directed to check blood sugar once daily. 100 each 5   aspirin EC 81 MG tablet Take 81 mg by mouth at bedtime.     Calcium-Vitamin D -Vitamin K (VIACTIV CALCIUM PLUS D) 650-12.5-40 MG-MCG-MCG CHEW Chew 1 tablet by mouth in the morning and at bedtime.     Cholecalciferol (VITAMIN D3 GUMMIES PO) Take 2 tablets by mouth in the morning.     Cyanocobalamin  (VITAMIN B-12 PO) Take 1,250 mcg by mouth in the morning.     esomeprazole  (NEXIUM ) 40 MG capsule Take 1 capsule (40 mg total) by mouth 2 (two) times daily before a meal. 60 capsule 2   ezetimibe  (ZETIA ) 10 MG tablet Take 1 tablet (10 mg total) by mouth daily. 90 tablet 3   flecainide  (TAMBOCOR ) 100 MG tablet Take 1 tablet (100 mg total) by mouth 2 (two) times daily. 180 tablet 3   fluticasone (FLONASE) 50 MCG/ACT nasal spray Place 1 spray into both nostrils in the morning.     furosemide (LASIX) 20 MG tablet Take 1 tablet (20 mg total) by mouth daily as needed for edema. 30 tablet 3   gabapentin (NEURONTIN) 300 MG capsule Take 300 mg by mouth daily as needed.     Glucosamine HCl (GLUCOSAMINE PO) Take 2,000 mg by mouth in the morning.     glucose blood (ACCU-CHEK GUIDE TEST) test strip Use to check blood sugar once daily as directed. 100 strip 5   losartan -hydrochlorothiazide  (HYZAAR ) 50-12.5 MG tablet Take 1 tablet by mouth daily. 90 tablet 1   metFORMIN  (GLUCOPHAGE ) 500 MG tablet Take 1 tablet (500 mg total) by mouth 2 (two) times daily with a meal. 180 tablet 0   Multiple Vitamins-Minerals (ADULT ONE DAILY GUMMIES PO) Take 2 tablets by mouth in the morning.     nitrofurantoin, macrocrystal-monohydrate, (MACROBID) 100 MG capsule Take 1  capsule by mouth as needed (bladder infection).     nitroGLYCERIN  (NITROSTAT ) 0.4 MG SL tablet Place 1 tablet (0.4 mg total) under the tongue every 5 minutes as needed for chest pain. Max 3 doses 25 tablet 2   ondansetron  (ZOFRAN -ODT) 4 MG disintegrating tablet DISSOLVE ONE TABLET BY MOUTH EVERY 4 HOURS AS NEEDED FOR NAUSEA AND VOMITING 30 tablet 2   simvastatin  (ZOCOR ) 20 MG tablet Take 1 tablet (20 mg total) by mouth daily at 6 PM. 90 tablet 3   VASCEPA  1 g capsule Take 2 capsules (2 g total) by mouth 2 (two) times daily. 360 capsule 3   No current facility-administered medications for this visit.    LABS/IMAGING: No results found for this or any previous visit (from the past 48 hours). No results found.  VITALS: BP 108/74   Pulse 66   Ht 5' 1.5 (1.562 m)   Wt  146 lb 6.4 oz (66.4 kg)   SpO2 98%   BMI 27.21 kg/m   EXAM: General appearance: alert and no distress Neck: no carotid bruit, no JVD and thyroid  not enlarged, symmetric, no tenderness/mass/nodules Lungs: clear to auscultation bilaterally Heart: regular rate and rhythm, S1, S2 normal, no murmur, click, rub or gallop Abdomen: soft, non-tender; bowel sounds normal; no masses,  no organomegaly and obese Extremities: extremities normal, atraumatic, no cyanosis or edema Pulses: 2+ and symmetric Skin: Skin color, texture, turgor normal. No rashes or lesions Neurologic: Grossly normal Psych: Pleasant  EKG: Deferred  ASSESSMENT: CAD - mild to moderate calcified ostial/proximal LAD stenosis on cardiac CTA (12/2017), negative Myoview  stress test (02/2024) Normal LVEF by echo (03/2023) PVCs-markedly improved on flecainde. Hyperlipidemia GERD Negative LP(a)  PLAN: 1.   Mrs. Fahs seems to be doing well with good control of her PVCs.  She had some symptoms such as shortness of breath but had a reassuring echo in 2024 and had a negative Myoview  stress test in September 2025.  Blood pressure appears well-controlled now after  some adjustments in her medications.  Overall I would continue her current therapies.  Plan follow-up annually or sooner as necessary.  Vinie KYM Maxcy, MD, [facility], FNLA, FACP  [place]  [facility]  Medical Director of the Advanced Lipid Disorders &  Cardiovascular Risk Reduction Clinic Diplomate of the American Board of Clinical Lipidology Attending Cardiologist  Direct Dial: [phone]  Fax: [phone]  Website:  [email or web address]    Vinie BROCKS Faris Coolman 04/06/2024, 8:26 AM

## 2024-04-07 ENCOUNTER — Other Ambulatory Visit (HOSPITAL_COMMUNITY): Payer: Self-pay

## 2024-04-09 ENCOUNTER — Other Ambulatory Visit: Payer: Self-pay | Admitting: Internal Medicine

## 2024-04-11 ENCOUNTER — Other Ambulatory Visit: Payer: Self-pay | Admitting: Internal Medicine

## 2024-04-12 ENCOUNTER — Telehealth: Payer: Self-pay | Admitting: Internal Medicine

## 2024-04-12 ENCOUNTER — Other Ambulatory Visit (HOSPITAL_BASED_OUTPATIENT_CLINIC_OR_DEPARTMENT_OTHER): Payer: Self-pay

## 2024-04-12 ENCOUNTER — Other Ambulatory Visit (HOSPITAL_COMMUNITY): Payer: Self-pay

## 2024-04-12 MED ORDER — ESOMEPRAZOLE MAGNESIUM 40 MG PO CPDR
40.0000 mg | DELAYED_RELEASE_CAPSULE | Freq: Two times a day (BID) | ORAL | 0 refills | Status: DC
  Filled 2024-04-12: qty 60, 30d supply, fill #0

## 2024-04-12 NOTE — Telephone Encounter (Signed)
 Patient needs an office visit.  I have sent several messages through the pharmacy about this.  If she schedules an appointment I will refill it.  Has not been seen in 2.5 years.

## 2024-04-12 NOTE — Telephone Encounter (Signed)
 Patient requesting refill for nexium 

## 2024-04-12 NOTE — Telephone Encounter (Signed)
 Patient has already been scheduled for next available.please see apt tab in chart.

## 2024-04-13 ENCOUNTER — Other Ambulatory Visit (HOSPITAL_BASED_OUTPATIENT_CLINIC_OR_DEPARTMENT_OTHER): Payer: Self-pay

## 2024-04-13 MED ORDER — ESOMEPRAZOLE MAGNESIUM 40 MG PO CPDR
40.0000 mg | DELAYED_RELEASE_CAPSULE | Freq: Two times a day (BID) | ORAL | 2 refills | Status: DC
  Filled 2024-04-13: qty 60, 30d supply, fill #0

## 2024-04-13 NOTE — Telephone Encounter (Signed)
 Nexium  refilled.  Sorry - I didn't see the appointment yesterday!

## 2024-04-16 ENCOUNTER — Other Ambulatory Visit (HOSPITAL_BASED_OUTPATIENT_CLINIC_OR_DEPARTMENT_OTHER): Payer: Self-pay

## 2024-05-01 ENCOUNTER — Other Ambulatory Visit: Payer: Self-pay

## 2024-05-02 ENCOUNTER — Other Ambulatory Visit (HOSPITAL_BASED_OUTPATIENT_CLINIC_OR_DEPARTMENT_OTHER): Payer: Self-pay

## 2024-05-02 ENCOUNTER — Other Ambulatory Visit: Payer: Self-pay | Admitting: Internal Medicine

## 2024-05-02 DIAGNOSIS — I493 Ventricular premature depolarization: Secondary | ICD-10-CM

## 2024-05-02 MED ORDER — FLECAINIDE ACETATE 100 MG PO TABS
100.0000 mg | ORAL_TABLET | Freq: Two times a day (BID) | ORAL | 3 refills | Status: AC
  Filled 2024-05-02: qty 180, 90d supply, fill #0

## 2024-05-03 ENCOUNTER — Other Ambulatory Visit (HOSPITAL_BASED_OUTPATIENT_CLINIC_OR_DEPARTMENT_OTHER): Payer: Self-pay

## 2024-05-16 ENCOUNTER — Other Ambulatory Visit (HOSPITAL_COMMUNITY): Payer: Self-pay

## 2024-05-18 ENCOUNTER — Other Ambulatory Visit: Payer: Self-pay

## 2024-05-18 ENCOUNTER — Other Ambulatory Visit (HOSPITAL_COMMUNITY): Payer: Self-pay

## 2024-05-18 MED ORDER — METFORMIN HCL 500 MG PO TABS
500.0000 mg | ORAL_TABLET | Freq: Two times a day (BID) | ORAL | 0 refills | Status: AC
  Filled 2024-05-18: qty 180, 90d supply, fill #0

## 2024-05-20 ENCOUNTER — Other Ambulatory Visit (HOSPITAL_COMMUNITY): Payer: Self-pay

## 2024-05-22 ENCOUNTER — Other Ambulatory Visit (HOSPITAL_BASED_OUTPATIENT_CLINIC_OR_DEPARTMENT_OTHER): Payer: Self-pay

## 2024-05-22 MED ORDER — ESTRADIOL 0.01 % VA CREA
1.0000 g | TOPICAL_CREAM | VAGINAL | 4 refills | Status: AC
  Filled 2024-05-22: qty 42.5, 90d supply, fill #0

## 2024-05-28 ENCOUNTER — Encounter: Payer: Self-pay | Admitting: Gastroenterology

## 2024-05-28 ENCOUNTER — Other Ambulatory Visit: Payer: Self-pay

## 2024-05-28 ENCOUNTER — Other Ambulatory Visit (HOSPITAL_BASED_OUTPATIENT_CLINIC_OR_DEPARTMENT_OTHER): Payer: Self-pay

## 2024-05-28 ENCOUNTER — Ambulatory Visit: Admitting: Gastroenterology

## 2024-05-28 VITALS — BP 120/70 | HR 64 | Ht 60.75 in | Wt 149.1 lb

## 2024-05-28 DIAGNOSIS — Z1211 Encounter for screening for malignant neoplasm of colon: Secondary | ICD-10-CM

## 2024-05-28 DIAGNOSIS — K581 Irritable bowel syndrome with constipation: Secondary | ICD-10-CM

## 2024-05-28 DIAGNOSIS — K219 Gastro-esophageal reflux disease without esophagitis: Secondary | ICD-10-CM

## 2024-05-28 MED ORDER — ESOMEPRAZOLE MAGNESIUM 40 MG PO CPDR
40.0000 mg | DELAYED_RELEASE_CAPSULE | Freq: Every day | ORAL | 3 refills | Status: AC
  Filled 2024-05-28: qty 90, 90d supply, fill #0

## 2024-05-28 MED ORDER — ONDANSETRON 4 MG PO TBDP
4.0000 mg | ORAL_TABLET | ORAL | 3 refills | Status: AC
  Filled 2024-05-28: qty 360, 60d supply, fill #0

## 2024-05-28 NOTE — Progress Notes (Signed)
 Noted

## 2024-05-28 NOTE — Progress Notes (Signed)
 Chief Complaint: med refill Primary GI MD: Dr. Abran  HPI: Discussed the use of AI scribe software for clinical note transcription with the patient, who gave verbal consent to proceed.  History of Present Illness  [name] is a 68 year old female who presents for medication refills for gastroesophageal reflux disease and nausea.  She is currently taking Nexium  40 mg once daily, although the prescription is for twice daily. Taking it once at night is sufficient unless she forgets, leading to heartburn and reflux by midday the next day. She had an endoscopy in 2022 which was normal except for a few gastric polyps. Recent labs in October showed normal kidney function.  She uses Zofran  as needed for nausea, which occurs when her gastrointestinal system becomes 'backed up'. She uses Zofran  approximately twice a month and it helps manage her symptoms. For constipation, she uses Viactiv with granola and juice for fiber intake and takes Citrucel as needed. She is not on any specific medication for constipation but follows dietary measures to manage it.  She emphasizes the importance of staying hydrated, especially as she is on Lasix  for blood pressure management, which can affect her fluid balance. She tries to drink enough water to stay hydrated   Past Medical History:  Diagnosis Date   ANXIETY 11/07/2009   Qualifier: Diagnosis of  By: Ever Riggers, Amy S - came after her diagnosis of breast cancer   Breast CA (HCC)    right - radiation & surgery   Diabetes (HCC)    Type II, diet control   DIVERTICULOSIS, COLON 06/26/2004   Qualifier: Diagnosis of  By: Myriam RN, Carissa     DM2 (diabetes mellitus, type 2) (HCC) 11/07/2009   Qualifier: Diagnosis of  By: Ever Riggers, Amy S    Family history of heart disease    Fatty liver    Gallstone    GASTRIC POLYP 03/30/2007   Qualifier: Diagnosis of  By: Myriam RN, Carissa     Gastroparesis    mild   GERD 11/07/2009   Qualifier:  Diagnosis of  By: Ever Riggers, Amy S    History of nuclear stress test 12/2011   bruce protocol; no evidence of ischemia, EF 79%, normal pattern of perfusion   Hyperchloremia    Hyperlipidemia    HYPERTHYROIDISM 11/07/2009   Qualifier: Diagnosis of  By: Ever PA-c, Amy S    Irritable bowel syndrome 08/04/2007   Qualifier: Diagnosis of  By: Myriam RN, Carissa     MVP (mitral valve prolapse)    last echo 07/1997 - normal LV size/function, mild MVP, mild MR   Osteopenia    OVARIAN CYST 08/04/2007   Qualifier: Diagnosis of  By: Myriam RN, Maudie     Personal history of radiation therapy    PVC (premature ventricular contraction)    Vitamin D  deficiency     Past Surgical History:  Procedure Laterality Date   ABDOMINAL HYSTERECTOMY  03/2003   APPENDECTOMY  01/2002   BREAST LUMPECTOMY Right 2006   x2   CARDIAC CATHETERIZATION  10/13/2009   no significant fixed obstructive CAD, normral ramus intermediate/circumflex/RCA; normal LV function (Dr. IVAR Sor)   CHOLECYSTECTOMY N/A 11/18/2020   Procedure: LAPAROSCOPIC CHOLECYSTECTOMY;  Surgeon: Rubin Calamity, MD;  Location: [facility] OR;  Service: General;  Laterality: N/A;   LAPAROSCOPIC OOPHERECTOMY     TONSILLECTOMY  1969    Current Outpatient Medications  Medication Sig Dispense Refill   Accu-Chek Softclix Lancets lancets Use as directed to  check blood sugar once daily. 100 each 5   aspirin EC 81 MG tablet Take 81 mg by mouth at bedtime.     Calcium-Vitamin D -Vitamin K (VIACTIV CALCIUM PLUS D) 650-12.5-40 MG-MCG-MCG CHEW Chew 1 tablet by mouth in the morning and at bedtime.     Cholecalciferol (VITAMIN D3 GUMMIES PO) Take 2 tablets by mouth in the morning.     Cyanocobalamin  (VITAMIN B-12 PO) Take 1,250 mcg by mouth in the morning.     estradiol  (ESTRACE ) 0.01 % CREA vaginal cream Place 1 g vaginally once a week. 85 g 4   ezetimibe  (ZETIA ) 10 MG tablet Take 1 tablet (10 mg total) by mouth daily. 90 tablet 3   flecainide  (TAMBOCOR ) 100  MG tablet Take 1 tablet (100 mg total) by mouth 2 (two) times daily. 180 tablet 3   fluticasone (FLONASE) 50 MCG/ACT nasal spray Place 1 spray into both nostrils in the morning.     furosemide  (LASIX ) 20 MG tablet Take 1 tablet (20 mg total) by mouth daily. 90 tablet 3   gabapentin  (NEURONTIN ) 300 MG capsule Take 300 mg by mouth daily as needed.     Glucosamine HCl (GLUCOSAMINE PO) Take 2,000 mg by mouth in the morning.     glucose blood (ACCU-CHEK GUIDE TEST) test strip Use to check blood sugar once daily as directed. 100 strip 5   losartan -hydrochlorothiazide  (HYZAAR ) 50-12.5 MG tablet Take 1 tablet by mouth daily. 90 tablet 1   metFORMIN  (GLUCOPHAGE ) 500 MG tablet Take 1 tablet (500 mg total) by mouth 2 (two) times daily with a meal. 180 tablet 0   Multiple Vitamins-Minerals (HAIR/SKIN/NAILS/BIOTIN PO) Take 2 tablets by mouth daily. Gummies     nitrofurantoin, macrocrystal-monohydrate, (MACROBID) 100 MG capsule Take 1 capsule by mouth as needed (bladder infection).     nitroGLYCERIN  (NITROSTAT ) 0.4 MG SL tablet Place 1 tablet (0.4 mg total) under the tongue every 5 minutes as needed for chest pain. Max 3 doses 25 tablet 2   simvastatin  (ZOCOR ) 20 MG tablet Take 1 tablet (20 mg total) by mouth daily at 6 PM. 90 tablet 3   VASCEPA  1 g capsule Take 2 capsules (2 g total) by mouth 2 (two) times daily. 360 capsule 3   esomeprazole  (NEXIUM ) 40 MG capsule Take 1 capsule (40 mg total) by mouth daily. KEEP SCHEDULED 05/2024 APPT FOR ADDITIONAL REFILLS 90 capsule 3   ondansetron  (ZOFRAN -ODT) 4 MG disintegrating tablet Take 1 tablet (4 mg total) by mouth every 4 (four) hours. 360 tablet 3   No current facility-administered medications for this visit.    Allergies as of 05/28/2024 - Review Complete 05/28/2024  Allergen Reaction Noted   Cocoa Other (See Comments) 03/12/2013   Chocolate Other (See Comments) 03/12/2013   Crestor [rosuvastatin]  03/19/2013   Erythromycin Nausea And Vomiting    Morphine   and codeine  Nausea And Vomiting 09/01/2018   Amoxicillin Rash 10/25/2007   Other Rash 11/30/2018   Sulfamethoxazole-trimethoprim Nausea Only 08/13/2019    Family History  Problem Relation Age of Onset   Diabetes Maternal Grandmother    Heart failure Maternal Grandmother    Emphysema Maternal Grandfather    Breast cancer Paternal Grandmother    Heart disease Paternal Grandfather    Alzheimer's disease Mother    Heart failure Father    Hypertension Father        also murmur, hyperlipidemia, MI, CABG age 80   Diabetes Brother    Heart disease Brother 72  MI at 45   Hypertension Brother    Hyperlipidemia Sister    Breast cancer Maternal Aunt        PVCs   Asthma Child        x2   Colon cancer Neg Hx    Colon polyps Neg Hx    Stomach cancer Neg Hx    Esophageal cancer Neg Hx     Social History   Socioeconomic History   Marital status: Married    Spouse name: Not on file   Number of children: 2   Years of education: bachelors   Highest education level: Not on file  Occupational History    Employer: [employer]  Tobacco Use   Smoking status: Never   Smokeless tobacco: Never  Vaping Use   Vaping status: Never Used  Substance and Sexual Activity   Alcohol use: Yes    Comment: occasionally   Drug use: No   Sexual activity: Not on file  Other Topics Concern   Not on file  Social History Narrative   Not on file   Social Drivers of Health   Tobacco Use: Low Risk (05/28/2024)   Patient History    Smoking Tobacco Use: Never    Smokeless Tobacco Use: Never    Passive Exposure: Not on file  Financial Resource Strain: Not on file  Food Insecurity: Not on file  Transportation Needs: Not on file  Physical Activity: Not on file  Stress: Not on file  Social Connections: Not on file  Intimate Partner Violence: Not on file  Depression (PHQ2-9): Not on file  Alcohol Screen: Not on file  Housing: Not on file  Utilities: Not on file  Health Literacy: Not on  file    Review of Systems:    Constitutional: No weight loss, fever, chills, weakness or fatigue HEENT: Eyes: No change in vision               Ears, Nose, Throat:  No change in hearing or congestion Skin: No rash or itching Cardiovascular: No chest pain, chest pressure or palpitations   Respiratory: No SOB or cough Gastrointestinal: See HPI and otherwise negative Genitourinary: No dysuria or change in urinary frequency Neurological: No headache, dizziness or syncope Musculoskeletal: No new muscle or joint pain Hematologic: No bleeding or bruising Psychiatric: No history of depression or anxiety    Physical Exam:  Vital signs: BP 120/70 (BP Location: Left Arm, Patient Position: Sitting, Cuff Size: Normal)   Pulse 64   Ht 5' 0.75 (1.543 m) Comment: height measured without shoes  Wt 149 lb 2 oz (67.6 kg)   BMI 28.41 kg/m   Constitutional: NAD, alert and cooperative Head:  Normocephalic and atraumatic. Eyes:   PEERL, EOMI. No icterus. Conjunctiva pink. Respiratory: Respirations even and unlabored. Lungs clear to auscultation bilaterally.   No wheezes, crackles, or rhonchi.  Cardiovascular:  Regular rate and rhythm. No peripheral edema, cyanosis or pallor.  Gastrointestinal:  Soft, nondistended, nontender. No rebound or guarding. Normal bowel sounds. No appreciable masses or hepatomegaly. Rectal:  Declines Msk:  Symmetrical without gross deformities. Without edema, no deformity or joint abnormality.  Neurologic:  Alert and  oriented x4;  grossly normal neurologically.  Skin:   Dry and intact without significant lesions or rashes. Psychiatric: Oriented to person, place and time. Demonstrates good judgement and reason without abnormal affect or behaviors.  Physical Exam    RELEVANT LABS AND IMAGING: CBC    Component Value Date/Time   WBC 9.0 02/14/2024  1416   WBC 17.0 (H) 07/18/2021 1453   RBC 4.48 02/14/2024 1416   RBC 4.89 07/18/2021 1453   HGB 14.6 02/14/2024 1416    HGB 12.7 12/17/2009 1417   HCT 43.7 02/14/2024 1416   HCT 36.2 12/17/2009 1417   PLT 385 02/14/2024 1416   MCV 98 (H) 02/14/2024 1416   MCV 95.1 12/17/2009 1417   MCH 32.6 02/14/2024 1416   MCH 31.7 07/18/2021 1453   MCHC 33.4 02/14/2024 1416   MCHC 34.2 07/18/2021 1453   RDW 12.7 02/14/2024 1416   RDW 13.5 12/17/2009 1417   LYMPHSABS 2.6 02/14/2024 1416   LYMPHSABS 2.0 12/17/2009 1417   MONOABS 0.6 09/01/2018 1735   MONOABS 0.6 12/17/2009 1417   EOSABS 0.3 02/14/2024 1416   BASOSABS 0.1 02/14/2024 1416   BASOSABS 0.1 12/17/2009 1417    CMP     Component Value Date/Time   NA 132 (L) 03/20/2024 1313   K 4.4 03/20/2024 1313   CL 93 (L) 03/20/2024 1313   CO2 24 03/20/2024 1313   GLUCOSE 132 (H) 03/20/2024 1313   GLUCOSE 143 (H) 07/18/2021 1453   BUN 10 03/20/2024 1313   CREATININE 0.75 03/20/2024 1313   CREATININE 1.12 (H) 03/14/2013 0735   CALCIUM 10.0 03/20/2024 1313   PROT 6.9 02/14/2024 1416   ALBUMIN 4.9 02/14/2024 1416   AST 19 02/14/2024 1416   ALT 18 02/14/2024 1416   ALKPHOS 77 02/14/2024 1416   BILITOT 0.5 02/14/2024 1416   GFRNONAA >60 07/18/2021 1453   GFRNONAA 54 (L) 03/14/2013 0735   GFRAA 58 (L) 09/01/2018 1735   GFRAA 62 03/14/2013 0735     Assessment/Plan:   GERD EGD 2022 with gastric polyps, otherwise unremarkable. Well controlled on omeprazole 40mg  once daily. Recent labs unremarkable. -- refill omeprazole 40mg  once daily -- advised of PPI side effects -- follow up one year or if worsening symptoms.  IBS-C On citracel (not daily). Occasionally has worsening constipation in which she develops nausea and takes zofran . Would like refill -- take citracel daily -- advised zofran  can cause worsening constipation, she only uses it for rescue (can refill) -- with worsening constipation episodes, recommend miralax 1 capful daily -- increase water, increase fiber, increase exercise.  Colon cancer screening Colonoscopy in 2023 with recall 10  years -- repeat 2033   Nestor Blower, PA-C [facility] 05/28/2024, 10:57 AM  Cc: Cleotilde Planas, MD

## 2024-05-28 NOTE — Patient Instructions (Signed)
 We have sent the following medications to your pharmacy for you to pick up at your convenience:  Nexium , Zofran .  Please follow up as needed.  _______________________________________________________  If your blood pressure at your visit was 140/90 or greater, please contact your primary care physician to follow up on this.  _______________________________________________________  If you are age 70 or older, your body mass index should be between 23-30. Your Body mass index is 28.41 kg/m. If this is out of the aforementioned range listed, please consider follow up with your Primary Care Provider.  If you are age 61 or younger, your body mass index should be between 19-25. Your Body mass index is 28.41 kg/m. If this is out of the aformentioned range listed, please consider follow up with your Primary Care Provider.   ________________________________________________________  The [facility] providers would like to encourage you to use MYCHART to communicate with providers for non-urgent requests or questions.  Due to long hold times on the telephone, sending your provider a message by [facility] may be a faster and more efficient way to get a response.  Please allow 48 business hours for a response.  Please remember that this is for non-urgent requests.  _______________________________________________________  [facility] is using a team-based approach to care.  Your team is made up of your doctor and two to three APPS. Our APPS (Nurse Practitioners and Physician Assistants) work with your physician to ensure care continuity for you. They are fully qualified to address your health concerns and develop a treatment plan. They communicate directly with your gastroenterologist to care for you. Seeing the Advanced Practice Practitioners on your physician's team can help you by facilitating care more promptly, often allowing for earlier appointments, access to diagnostic testing, procedures, and  other specialty referrals.

## 2024-06-18 ENCOUNTER — Other Ambulatory Visit (HOSPITAL_BASED_OUTPATIENT_CLINIC_OR_DEPARTMENT_OTHER): Payer: Self-pay

## 2024-06-21 ENCOUNTER — Other Ambulatory Visit (HOSPITAL_BASED_OUTPATIENT_CLINIC_OR_DEPARTMENT_OTHER): Payer: Self-pay

## 2024-06-21 ENCOUNTER — Other Ambulatory Visit (HOSPITAL_COMMUNITY): Payer: Self-pay

## 2024-06-21 MED ORDER — CYCLOBENZAPRINE HCL 5 MG PO TABS
5.0000 mg | ORAL_TABLET | Freq: Three times a day (TID) | ORAL | 0 refills | Status: AC
  Filled 2024-06-21: qty 21, 7d supply, fill #0

## 2024-06-21 MED ORDER — GABAPENTIN 300 MG PO CAPS
300.0000 mg | ORAL_CAPSULE | Freq: Every day | ORAL | 1 refills | Status: AC
  Filled 2024-06-27: qty 90, 90d supply, fill #0

## 2024-06-27 ENCOUNTER — Other Ambulatory Visit (HOSPITAL_BASED_OUTPATIENT_CLINIC_OR_DEPARTMENT_OTHER): Payer: Self-pay

## 2024-06-28 ENCOUNTER — Other Ambulatory Visit (HOSPITAL_BASED_OUTPATIENT_CLINIC_OR_DEPARTMENT_OTHER): Payer: Self-pay

## 2024-06-28 ENCOUNTER — Encounter: Payer: Self-pay | Admitting: Neurology

## 2024-06-28 ENCOUNTER — Other Ambulatory Visit (HOSPITAL_COMMUNITY): Payer: Self-pay

## 2024-06-29 ENCOUNTER — Telehealth (HOSPITAL_COMMUNITY): Payer: Self-pay

## 2024-06-29 ENCOUNTER — Other Ambulatory Visit (HOSPITAL_COMMUNITY): Payer: Self-pay

## 2024-06-29 NOTE — Telephone Encounter (Signed)
 These Accu Chek Softclix were in the [facility] charging que. Part D reject says to bill Part B but there is no Part B on file. Can someone call her and see if she has Part B please?

## 2024-07-07 ENCOUNTER — Other Ambulatory Visit (HOSPITAL_BASED_OUTPATIENT_CLINIC_OR_DEPARTMENT_OTHER): Payer: Self-pay

## 2024-07-07 MED ORDER — CYCLOBENZAPRINE HCL 10 MG PO TABS
10.0000 mg | ORAL_TABLET | Freq: Two times a day (BID) | ORAL | 1 refills | Status: AC | PRN
  Filled 2024-07-07: qty 60, 30d supply, fill #0

## 2024-07-08 ENCOUNTER — Other Ambulatory Visit: Payer: Self-pay

## 2024-09-10 ENCOUNTER — Ambulatory Visit: Admitting: Adult Health
